# Patient Record
Sex: Female | Born: 1965 | Race: White | Hispanic: No | Marital: Married | Smoking: Never smoker
Health system: Southern US, Community
[De-identification: ages and names within clinical notes are randomized; demographics above are authoritative.]

## PROBLEM LIST (undated history)

## (undated) DIAGNOSIS — M1990 Unspecified osteoarthritis, unspecified site: Secondary | ICD-10-CM

## (undated) DIAGNOSIS — C50919 Malignant neoplasm of unspecified site of unspecified female breast: Secondary | ICD-10-CM

## (undated) DIAGNOSIS — K219 Gastro-esophageal reflux disease without esophagitis: Secondary | ICD-10-CM

## (undated) DIAGNOSIS — A770 Spotted fever due to Rickettsia rickettsii: Secondary | ICD-10-CM

## (undated) DIAGNOSIS — L409 Psoriasis, unspecified: Secondary | ICD-10-CM

## (undated) HISTORY — PX: BREAST SURGERY: SHX581

---

## 2006-05-09 ENCOUNTER — Emergency Department: Payer: Self-pay | Admitting: Emergency Medicine

## 2010-06-18 ENCOUNTER — Ambulatory Visit: Payer: Self-pay | Admitting: Specialist

## 2013-10-15 ENCOUNTER — Emergency Department: Payer: Self-pay | Admitting: Emergency Medicine

## 2013-10-15 LAB — CBC
HGB: 12.8 g/dL (ref 12.0–16.0)
MCH: 31.9 pg (ref 26.0–34.0)
MCV: 93 fL (ref 80–100)
RDW: 12.9 % (ref 11.5–14.5)

## 2013-10-15 LAB — BASIC METABOLIC PANEL
Anion Gap: 7 (ref 7–16)
BUN: 13 mg/dL (ref 7–18)
Calcium, Total: 9.4 mg/dL (ref 8.5–10.1)
Chloride: 103 mmol/L (ref 98–107)

## 2013-10-15 LAB — TROPONIN I: Troponin-I: <0.02 ng/mL

## 2013-12-20 ENCOUNTER — Ambulatory Visit: Payer: Self-pay

## 2017-01-25 ENCOUNTER — Ambulatory Visit: Payer: Managed Care, Other (non HMO) | Attending: Nurse Practitioner | Admitting: Occupational Therapy

## 2017-01-25 ENCOUNTER — Encounter: Payer: Self-pay | Admitting: Occupational Therapy

## 2017-01-25 DIAGNOSIS — M79601 Pain in right arm: Secondary | ICD-10-CM | POA: Diagnosis present

## 2017-01-25 DIAGNOSIS — M6281 Muscle weakness (generalized): Secondary | ICD-10-CM | POA: Insufficient documentation

## 2017-01-25 DIAGNOSIS — M25612 Stiffness of left shoulder, not elsewhere classified: Secondary | ICD-10-CM | POA: Insufficient documentation

## 2017-01-25 DIAGNOSIS — M25611 Stiffness of right shoulder, not elsewhere classified: Secondary | ICD-10-CM | POA: Diagnosis present

## 2017-01-25 DIAGNOSIS — I972 Postmastectomy lymphedema syndrome: Secondary | ICD-10-CM | POA: Insufficient documentation

## 2017-01-25 DIAGNOSIS — M79602 Pain in left arm: Secondary | ICD-10-CM | POA: Diagnosis present

## 2017-01-25 NOTE — Therapy (Signed)
[place] [facility] [address], [place], [address] Phone: [phone]   Fax:  [phone]  Occupational Therapy Evaluation  Patient Details  Name: Sue Wong MRN: [id number] Date of Birth: May 04, 1966 Referring Provider: Freada Bergeron  Encounter Date: 01/25/2017      OT End of Session - 01/25/17 2104    Visit Number 1   Number of Visits 16   Date for OT Re-Evaluation 03/22/17   OT Start Time 1210   OT Stop Time 1319   OT Time Calculation (min) 69 min   Activity Tolerance Patient tolerated treatment well;Patient limited by pain   Behavior During Therapy [facility] for tasks assessed/performed      No past medical history on file.  Past Surgical History:  Procedure Laterality Date  . BREAST SURGERY Bilateral 1/9 and 12/27/16    There were no vitals filed for this visit.      Subjective Assessment - 01/25/17 2050    Subjective  I had chemo last year and then surgery in 1/9 bilateral mastectomy with expanders and then again 1/23 axillary ln removed in R - 9 ln was removed - had 2 drains in and then 1 - cannot raise my arms above my head , and  I have now lymphedema in my R arm - going to start radiation soon    Patient Stated Goals I do not want a large arm  - and want to be able to raise my arms above my head - need to do radiation soon and be able to hold my arm in position    Currently in Pain? Yes   Pain Score 6    Pain Location Axilla   Pain Orientation Right;Left   Pain Descriptors / Indicators Tightness;Tender;Sore;Sharp   Pain Type Surgical pain   Pain Onset More than a month ago   Aggravating Factors  raising my arms            [facility] OT Assessment - 01/25/17 0001      Assessment   Diagnosis Bilateral mastectomy and R UE lymphedema    Referring Provider Freada Bergeron   Onset Date 12/13/16     Home  Environment   Lives With Spouse     Prior Function   Vocation Full time employment    Leisure Work as Engineer, production, R hand dominant , likes to do cycling, reading , visit with friends , movies , has home gym at home      AROM   Right Shoulder Extension 38 Degrees   Right Shoulder Flexion 80 Degrees   Right Shoulder ABduction 60 Degrees   Left Shoulder Extension 30 Degrees   Left Shoulder Flexion 97 Degrees   Left Shoulder ABduction 90 Degrees          LYMPHEDEMA/ONCOLOGY QUESTIONNAIRE - 01/25/17 1240      Right Upper Extremity Lymphedema   15 cm Proximal to Olecranon Process 33.4 cm   10 cm Proximal to Olecranon Process 33 cm   Olecranon Process 29.7 cm   15 cm Proximal to Ulnar Styloid Process 28 cm   10 cm Proximal to Ulnar Styloid Process 25 cm   Just Proximal to Ulnar Styloid Process 17.3 cm   Across Hand at [employer] 18.5 cm   At [place] of 2nd Digit 5.3 cm   At [facility] of Thumb 6 cm     Left Upper Extremity Lymphedema   15 cm Proximal to Olecranon Process  33.5 cm   10 cm Proximal to Olecranon Process 30.4 cm   Olecranon Process 27.6 cm   15 cm Proximal to Ulnar Styloid Process 26.4 cm   10 cm Proximal to Ulnar Styloid Process 23 cm   Just Proximal to Ulnar Styloid Process 17.3 cm   Across Hand at [employer] 17.6 cm   At [place] of 2nd Digit 6.4 cm   At [facility] of Thumb 6 cm     Reviewed HEP with pt and hand out provided   education done about lymphedema  HEP hand out :  Pendulum for bilateral shoulders AAROM on table for ext rotation and flexion  Supine using cane for AAROM for ABD and Flexion   External rotation with hands behind head - gentle ROM  pect stretch on wall corner or doorway And scapula squeezes  10 reps each  Slight pull - pain less than 1-2/10                  OT Education - 01/25/17 2104    Education provided Yes   Education Details Findings and HEP for ROM    Person(s) Educated Patient   Methods Explanation;Demonstration;Tactile cues;Verbal cues;Handout   Comprehension Verbal cues required;Returned  demonstration;Verbalized understanding          OT Short Term Goals - 01/25/17 2113      OT SHORT TERM GOAL #1   Title Pt R shoulder AROM improve with at least 30 degrees  for pt to do hair, pull shirt over head and reach over head in cabinet   Baseline R  flexion 80 and ABD 60, external rotation impaired    Time 4   Period Weeks   Status New     OT SHORT TERM GOAL #2   Title L shoulder AROM improve to WNL to reach over head , use in bathing and dressing without increase symptoms    Baseline L shoulder ext 30, flexion 97 ABD 90 adn external rotation impaired   Time 4   Period Weeks   Status New     OT SHORT TERM GOAL #3   Title Pt ed on self MLD to do at home to decrease lymphedema circumference in R UE by 1 cm    Baseline no knowledge   Time 3   Period Weeks   Status New           OT Long Term Goals - 01/25/17 2119      OT LONG TERM GOAL #1   Title Pt to be ind in Homeprogram for compression and MLD to decrease and maintain lymphedema in R UE by 1-2 cm    Baseline increase by 2 cm in forearm, elbow 3.1 and upper arm 2.6 - no knowledge on lymphedema management   Time 5   Period Weeks   Status New     OT LONG TERM GOAL #2   Title Bilateral UE AROM improve to WNL to do be able to get into radiation position , and return to prior level of function in daily activities    Baseline see flowsheet   Time 6   Period Weeks   Status New     OT LONG TERM GOAL #3   Title assess [name]    Time 1   Period Weeks   Status New               Plan - 01/25/17 2106    Clinical Impression Statement Pt present 6 wks  s/p bilateral mastectomies with expanders - and 4 wks out from 9 axillary ln removed in R axilla - pt had drains with both surgeries - pt present with lymphedema in R UE - upper arm increase by 2.6 cm , elbow 3.1 cm and forearm 2 cm compare to L - pt also present with decrease bilateral shoulder ROM in  all planes - R worse than L - pt has increase pain with  ROM  more than 60-90 degrees - pt in near future starting radiation    Rehab Potential Good   OT Frequency 2x / week   OT Duration 8 weeks   OT Treatment/Interventions Self-care/ADL training;Manual lymph drainage;Compression bandaging;Therapeutic exercises;Scar mobilization;Passive range of motion;Manual Therapy   Plan assess progress with HEP and start MLD - assess what compression   OT Home Exercise Plan see pt instruction   Consulted and Agree with Plan of Care Patient      Patient will benefit from skilled therapeutic intervention in order to improve the following deficits and impairments:  Decreased range of motion, Impaired flexibility, Increased edema, Decreased scar mobility, Impaired UE functional use, Pain, Decreased strength, Decreased knowledge of precautions, Decreased knowledge of use of DME, Decreased skin integrity  Visit Diagnosis: Postmastectomy lymphedema syndrome - Plan: Ot plan of care cert/re-cert  Stiffness of left shoulder, not elsewhere classified - Plan: Ot plan of care cert/re-cert  Stiffness of right shoulder, not elsewhere classified - Plan: Ot plan of care cert/re-cert  Muscle weakness (generalized) - Plan: Ot plan of care cert/re-cert  Pain in left arm - Plan: Ot plan of care cert/re-cert  Pain in right arm - Plan: Ot plan of care cert/re-cert    Problem List There are no active problems to display for this patient.   Rosalyn Gess OTR/L,CLT 01/25/2017, 9:26 PM  [place] [facility] [address], [place], [address] Phone: [phone]   Fax:  [phone]  Name: Sue Wong MRN: [id number] Date of Birth: 04-13-1966

## 2017-01-25 NOTE — Patient Instructions (Signed)
Pendulum for bilateral shoulders AAROM on table for ext rotation and flexion  Supine using cane for AAROM for ABD and Flexion   External rotation with hands behind head - gentle ROM  pect stretch on wall corner or doorway And scapula squeezes  10 reps each  Slight pull - pain less than 1-2/10

## 2017-01-26 ENCOUNTER — Ambulatory Visit: Payer: 59 | Admitting: Occupational Therapy

## 2017-01-26 ENCOUNTER — Ambulatory Visit: Payer: Managed Care, Other (non HMO) | Admitting: Occupational Therapy

## 2017-01-26 DIAGNOSIS — M25612 Stiffness of left shoulder, not elsewhere classified: Secondary | ICD-10-CM

## 2017-01-26 DIAGNOSIS — M79601 Pain in right arm: Secondary | ICD-10-CM

## 2017-01-26 DIAGNOSIS — I972 Postmastectomy lymphedema syndrome: Secondary | ICD-10-CM

## 2017-01-26 DIAGNOSIS — M6281 Muscle weakness (generalized): Secondary | ICD-10-CM

## 2017-01-26 DIAGNOSIS — M79602 Pain in left arm: Secondary | ICD-10-CM

## 2017-01-26 DIAGNOSIS — M25611 Stiffness of right shoulder, not elsewhere classified: Secondary | ICD-10-CM

## 2017-01-26 NOTE — Therapy (Signed)
[place] [facility] [address], [place], [address] Phone: [phone]   Fax:  [phone]  Occupational Therapy Treatment  Patient Details  Name: Sue Wong MRN: [id number] Date of Birth: [birth year] Referring Provider: Freada Bergeron  Encounter Date: 01/26/2017      OT End of Session - 01/26/17 1819    Visit Number 2   Number of Visits 16   Date for OT Re-Evaluation 03/22/17   OT Start Time 1413   OT Stop Time 1510   OT Time Calculation (min) 57 min   Activity Tolerance Patient tolerated treatment well;Patient limited by pain   Behavior During Therapy [facility] for tasks assessed/performed      No past medical history on file.  Past Surgical History:  Procedure Laterality Date  . BREAST SURGERY Bilateral 1/9 and 12/27/16    There were no vitals filed for this visit.      Subjective Assessment - 01/26/17 1816    Subjective  Pt report did her exercises  last night and in shower this am - had questions about drinking wine or beer    Patient Stated Goals I do not want a large arm  - and want to be able to raise my arms above my head - need to do radiation soon and be able to hold my arm in position    Currently in Pain? Yes   Pain Score 5    Pain Location Shoulder   Pain Orientation Right;Left   Pain Descriptors / Indicators Shooting;Tightness;Sore   Pain Type Surgical pain   Pain Onset More than a month ago   Aggravating Factors  Raising her arms             LYMPHEDEMA/ONCOLOGY QUESTIONNAIRE - 01/25/17 1240      Right Upper Extremity Lymphedema   15 cm Proximal to Olecranon Process 33.4 cm   10 cm Proximal to Olecranon Process 33 cm   Olecranon Process 29.7 cm   15 cm Proximal to Ulnar Styloid Process 28 cm   10 cm Proximal to Ulnar Styloid Process 25 cm   Just Proximal to Ulnar Styloid Process 17.3 cm   Across Hand at [employer] 18.5 cm   At [place] of 2nd Digit 5.3 cm   At [facility] of  Thumb 6 cm     Left Upper Extremity Lymphedema   15 cm Proximal to Olecranon Process 33.5 cm   10 cm Proximal to Olecranon Process 30.4 cm   Olecranon Process 27.6 cm   15 cm Proximal to Ulnar Styloid Process 26.4 cm   10 cm Proximal to Ulnar Styloid Process 23 cm   Just Proximal to Ulnar Styloid Process 17.3 cm   Across Hand at [employer] 17.6 cm   At [place] of 2nd Digit 6.4 cm   At [facility] of Thumb 6 cm       Pt ed on lymphedema and info provided with hand outs  Screen shoulder AROM compare to yesterday   Done MLD with pt and educated - pt to use PAA - and hand out provided : 1. Hug yourself at the base of your neck and do 8 small circles, and 2 fingers behind clavicle 8 x  2. Do 8 semicircles at left armpit and right groin 3. Pump across chest from right to left 8 times 4. Pump down the right side of trunk from armpit to groin 8 times 5. Pump up the outside of right  upper arm 8 times, inside of upper arm to outside 8x, outside of upper arm again 8x  6. Pump across chest from R to L 8 times 7. Pump  down the right side of trunk from armpit to groin 8 times 8. Pump top of forearm from wrist to elbow 8 times 9. Pump up the outside of right upper arm 8 times 10.       Pump across chest from right to left 8 times 11. Pump down the right side of trunk from armpit to groin 8 times 12. Pump up the back of the forearm from wrist to elbow 8 times 13. Pump up the outside of right upper arm 8 times 14. Pump across chest from right to left 8 times 15. Pump down the right side of trunk from armpit to groin 8 times 16. Do 8 semicircles at left armpit and right groin 8 times 17. Repeat nr.1  Pt fitted with isotoner glove and tubigrip nr D and F for R UE to wear at home as much as can - will remeasure her Wed and hope to decrease her by 1-2 cm to get fitted with over the counter compression sleeve and glove  In sidelying done some R shoulder ABD AAROM with some soft tissue massage an stretch  on lateral trunk  And in supine  Gentle slow stretch and rhitmic movement for R shoulder into ABD and ext rotation                     OT Education - 01/26/17 1819    Education provided Yes   Education Details Self MLD    [employer]) Educated Patient   Methods Explanation;Demonstration;Tactile cues;Verbal cues;Handout   Comprehension Verbalized understanding;Returned demonstration;Verbal cues required          OT Short Term Goals - 01/25/17 2113      OT SHORT TERM GOAL #1   Title Pt R shoulder AROM improve with at least 30 degrees  for pt to do hair, pull shirt over head and reach over head in cabinet   Baseline R  flexion 80 and ABD 60, external rotation impaired    Time 4   Period Weeks   Status New     OT SHORT TERM GOAL #2   Title L shoulder AROM improve to WNL to reach over head , use in bathing and dressing without increase symptoms    Baseline L shoulder ext 30, flexion 97 ABD 90 adn external rotation impaired   Time 4   Period Weeks   Status New     OT SHORT TERM GOAL #3   Title Pt ed on self MLD to do at home to decrease lymphedema circumference in R UE by 1 cm    Baseline no knowledge   Time 3   Period Weeks   Status New           OT Long Term Goals - 01/25/17 2119      OT LONG TERM GOAL #1   Title Pt to be ind in Homeprogram for compression and MLD to decrease and maintain lymphedema in R UE by 1-2 cm    Baseline increase by 2 cm in forearm, elbow 3.1 and upper arm 2.6 - no knowledge on lymphedema management   Time 5   Period Weeks   Status New     OT LONG TERM GOAL #2   Title Bilateral UE AROM improve to WNL to do be able to get  into radiation position , and return to prior level of function in daily activities    Baseline see flowsheet   Time 6   Period Weeks   Status New     OT LONG TERM GOAL #3   Title assess [name]    Time 1   Period Weeks   Status New               Plan - 01/26/17 1820    Clinical Impression  Statement Pt was ed on self MLD -and to use PAA because of expanders and going to have maybe radiation - pt sterristrips on in R axilla - did fit pt with size D and F tubi grip and isotoner glove for the weekend to provided some  compression until next time - to wear as much as she can at home - and will reassess on WEd - hope to decrease her  under 2 cm  compare to L UE - to be fitted with over the counter compression sleeve -cont with ROM  for bilateral shoulders     Rehab Potential Good   OT Frequency 2x / week   OT Duration 8 weeks   OT Treatment/Interventions Self-care/ADL training;Manual lymph drainage;Compression bandaging;Therapeutic exercises;Scar mobilization;Passive range of motion;Manual Therapy   Plan assess circumference and  self MLD - how doing - review HEP and measure ROM    OT Home Exercise Plan see pt instruction   Consulted and Agree with Plan of Care Patient      Patient will benefit from skilled therapeutic intervention in order to improve the following deficits and impairments:  Decreased range of motion, Impaired flexibility, Increased edema, Decreased scar mobility, Impaired UE functional use, Pain, Decreased strength, Decreased knowledge of precautions, Decreased knowledge of use of DME, Decreased skin integrity  Visit Diagnosis: Postmastectomy lymphedema syndrome  Stiffness of left shoulder, not elsewhere classified  Stiffness of right shoulder, not elsewhere classified  Muscle weakness (generalized)  Pain in left arm  Pain in right arm    Problem List There are no active problems to display for this patient.   Rosalyn Gess OTR/L,CLT 01/26/2017, 6:24 PM  [place] [facility] [address] [place], [address] Phone: [phone]   Fax:  [phone]  Name: Sue Wong MRN: [id number] Date of Birth: Oct 20, 1966

## 2017-01-26 NOTE — Patient Instructions (Signed)
Pt ed on MLD  1. Hug yourself at the base of your neck and do 8 small circles, and 2 fingers behind clavicle 8 x  2. Do 8 semicircles at left armpit and right groin 3. Pump across chest from right to left 8 times 4. Pump down the right side of trunk from armpit to groin 8 times 5. Pump up the outside of right upper arm 8 times, inside of upper arm to outside 8x, outside of upper arm again 8x  6. Pump across chest from R to L 8 times 7. Pump  down the right side of trunk from armpit to groin 8 times 8. Pump top of forearm from wrist to elbow 8 times 9. Pump up the outside of right upper arm 8 times 10.       Pump across chest from right to left 8 times 11. Pump down the right side of trunk from armpit to groin 8 times 12. Pump up the back of the forearm from wrist to elbow 8 times 13. Pump up the outside of right upper arm 8 times 14. Pump across chest from right to left 8 times 15. Pump down the right side of trunk from armpit to groin 8 times 16. Do 8 semicircles at left armpit and right groin 8 times 17. Repeat nr.     And cont with same HEP provided yesterday

## 2017-02-01 ENCOUNTER — Ambulatory Visit: Payer: Managed Care, Other (non HMO) | Admitting: Occupational Therapy

## 2017-02-01 DIAGNOSIS — M25612 Stiffness of left shoulder, not elsewhere classified: Secondary | ICD-10-CM

## 2017-02-01 DIAGNOSIS — M6281 Muscle weakness (generalized): Secondary | ICD-10-CM

## 2017-02-01 DIAGNOSIS — I972 Postmastectomy lymphedema syndrome: Secondary | ICD-10-CM | POA: Diagnosis not present

## 2017-02-01 DIAGNOSIS — M79601 Pain in right arm: Secondary | ICD-10-CM

## 2017-02-01 DIAGNOSIS — M25611 Stiffness of right shoulder, not elsewhere classified: Secondary | ICD-10-CM

## 2017-02-01 DIAGNOSIS — M79602 Pain in left arm: Secondary | ICD-10-CM

## 2017-02-01 NOTE — Therapy (Signed)
[place] [facility] [address] [place], [address] Phone: [phone]   Fax:  [phone]  Occupational Therapy Treatment  Patient Details  Name: Sue Wong MRN: [id number] Date of Birth: [birth year] Referring Provider: Freada Wong  Encounter Date: 02/01/2017      OT End of Session - 02/01/17 0947    Visit Number 3   Number of Visits 16   Date for OT Re-Evaluation 03/22/17   OT Start Time 0804   OT Stop Time 0910   OT Time Calculation (min) 66 min   Activity Tolerance Patient tolerated treatment well;Patient limited by pain   Behavior During Therapy [facility] for tasks assessed/performed      No past medical history on file.  Past Surgical History:  Procedure Laterality Date  . BREAST SURGERY Bilateral 1/9 and 12/27/16    There were no vitals filed for this visit.      Subjective Assessment - 02/01/17 0806    Subjective  Feel there is more mobility - no pain - had 48hr bug - feel heavy my arm - feels like I want to wear something at work   Patient Stated Goals I do not want a large arm  - and want to be able to raise my arms above my head - need to do radiation soon and be able to hold my arm in position    Currently in Pain? No/denies            [facility] OT Assessment - 02/01/17 0001      AROM   Right Shoulder Extension 58 Degrees   Right Shoulder Flexion 105 Degrees   Right Shoulder ABduction 100 Degrees   Left Shoulder Extension 54 Degrees   Left Shoulder Flexion 122 Degrees   Left Shoulder ABduction 105 Degrees         LYMPHEDEMA/ONCOLOGY QUESTIONNAIRE - 02/01/17 0807      Right Upper Extremity Lymphedema   15 cm Proximal to Olecranon Process 34 cm   10 cm Proximal to Olecranon Process 32 cm   Olecranon Process 29.4 cm   15 cm Proximal to Ulnar Styloid Process 28.4 cm   10 cm Proximal to Ulnar Styloid Process 25 cm   Just Proximal to Ulnar Styloid Process 17.1 cm   Across Hand at  [employer] 18.5 cm       Measured circumference of R UE  - most same - 2 increase - and elbow and distal upper arm increase Measured AROM for bilateral shoulder - see flowsheet  Pt supine Soft tissue mobs for Pect stretch  R axilla- sterristrips come off - all healed - scar mobs started and pt to have family assist  Shoulder in 90 degrees ABD and external rotation on pillow - stretch in axilla - with soft tissue  AAROM by OT for bilateral shoulder flexion and ABD in supine  Attempted snow angels - on pillow  - cannot keep elbow on pillow  External rotation on pillow - L and R  8-10 reps   Kinesiotape done for R Shoulder and upper arm - with achor in R cervical ln  3 fingers into upper arm  Tape achor on R upper traps - with 3 fingers over R scapula to axilla  Pt ed on precautions and allergy signs   Demo and ed pt on bandaging R UE from hand and upper arm - using 6 cm wrist<>hand<>forearm  And then 8 cm wrist<>hand<> to  upper  arm  Pt to wear after work 2 hrs , then try 4 hrs - and then can try at night time but need Isotoner glove on hand                      OT Education - 02/01/17 0946    Education provided Yes   Education Details taping , MLD, ROM , scar massage., bandaging    Person(s) Educated Patient   Methods Explanation;Demonstration;Tactile cues;Verbal cues   Comprehension Verbal cues required;Returned demonstration;Verbalized understanding          OT Short Term Goals - 01/25/17 2113      OT SHORT TERM GOAL #1   Title Pt R shoulder AROM improve with at least 30 degrees  for pt to do hair, pull shirt over head and reach over head in cabinet   Baseline R  flexion 80 and ABD 60, external rotation impaired    Time 4   Period Weeks   Status New     OT SHORT TERM GOAL #2   Title L shoulder AROM improve to WNL to reach over head , use in bathing and dressing without increase symptoms    Baseline L shoulder ext 30, flexion 97 ABD 90 adn external  rotation impaired   Time 4   Period Weeks   Status New     OT SHORT TERM GOAL #3   Title Pt ed on self MLD to do at home to decrease lymphedema circumference in R UE by 1 cm    Baseline no knowledge   Time 3   Period Weeks   Status New           OT Long Term Goals - 01/25/17 2119      OT LONG TERM GOAL #1   Title Pt to be ind in Homeprogram for compression and MLD to decrease and maintain lymphedema in R UE by 1-2 cm    Baseline increase by 2 cm in forearm, elbow 3.1 and upper arm 2.6 - no knowledge on lymphedema management   Time 5   Period Weeks   Status New     OT LONG TERM GOAL #2   Title Bilateral UE AROM improve to WNL to do be able to get into radiation position , and return to prior level of function in daily activities    Baseline see flowsheet   Time 6   Period Weeks   Status New     OT LONG TERM GOAL #3   Title assess Sue Wong    Time 1   Period Weeks   Status New               Plan - 02/01/17 0947    Clinical Impression Statement Pt R UE circumference still increase - about the same - but pts  ROM in R and L shoulder increase greatly - pt was taped for R UE and thoracic lymphedema and ed on bandaging after work and maybe during night if can tolerate    Rehab Potential Good   OT Frequency 2x / week   OT Duration 8 weeks   OT Treatment/Interventions Self-care/ADL training;Manual lymph drainage;Compression bandaging;Therapeutic exercises;Scar mobilization;Passive range of motion;Manual Therapy   Plan assess progress in lymphedema, ROM , pain , scar    OT Home Exercise Plan see pt instruction   Consulted and Agree with Plan of Care Patient      Patient will benefit from skilled therapeutic intervention in order to  improve the following deficits and impairments:  Decreased range of motion, Impaired flexibility, Increased edema, Decreased scar mobility, Impaired UE functional use, Pain, Decreased strength, Decreased knowledge of precautions,  Decreased knowledge of use of DME, Decreased skin integrity  Visit Diagnosis: Postmastectomy lymphedema syndrome  Stiffness of left shoulder, not elsewhere classified  Stiffness of right shoulder, not elsewhere classified  Muscle weakness (generalized)  Pain in left arm  Pain in right arm    Problem List There are no active problems to display for this patient.   Rosalyn Gess OTR/L,CLT 02/01/2017, 9:50 AM  [place] [facility] [address], [place], [address] Phone: [phone]   Fax:  [phone]  Name: Sue Wong MRN: [id number] Date of Birth: 04/24/1966

## 2017-02-01 NOTE — Patient Instructions (Signed)
Same HEP for ROM  Add scar massage in R axilla Kinesiotape done for R Shoulder and upper arm - with achor in R cervical ln  3 fingers into upper arm  Tape achor on R upper traps - with 3 fingers over R scapula to axilla  Pt ed on precautions and allergy signs   Demo and ed pt on bandaging R UE from hand and upper arm - using 6 cm wrist<>hand<>forearm  And then 8 cm wrist<>hand<> to  upper arm  Pt to wear after work 2 hrs , then try 4 hrs - and then can try at night time but need Isotoner glove on hand

## 2017-02-03 ENCOUNTER — Ambulatory Visit: Payer: 59 | Attending: Nurse Practitioner | Admitting: Occupational Therapy

## 2017-02-03 DIAGNOSIS — I972 Postmastectomy lymphedema syndrome: Secondary | ICD-10-CM | POA: Diagnosis not present

## 2017-02-03 DIAGNOSIS — M79602 Pain in left arm: Secondary | ICD-10-CM | POA: Insufficient documentation

## 2017-02-03 DIAGNOSIS — M25611 Stiffness of right shoulder, not elsewhere classified: Secondary | ICD-10-CM | POA: Diagnosis present

## 2017-02-03 DIAGNOSIS — M79601 Pain in right arm: Secondary | ICD-10-CM | POA: Diagnosis present

## 2017-02-03 DIAGNOSIS — M6281 Muscle weakness (generalized): Secondary | ICD-10-CM | POA: Diagnosis present

## 2017-02-03 DIAGNOSIS — M25612 Stiffness of left shoulder, not elsewhere classified: Secondary | ICD-10-CM | POA: Insufficient documentation

## 2017-02-03 NOTE — Therapy (Signed)
[facility] [facility] [address] [place], [address] Phone: [phone]   Fax:  [phone]  Occupational Therapy Treatment  Patient Details  Name: Sue Wong MRN: [id number] Date of Birth: 08-27-1966 Referring Provider: Freada Bergeron  Encounter Date: 02/03/2017      OT End of Session - 02/03/17 1613    Visit Number 4   Number of Visits 16   Date for OT Re-Evaluation 03/22/17   OT Start Time 0902   OT Stop Time 1000   OT Time Calculation (min) 58 min   Activity Tolerance Patient tolerated treatment well;Patient limited by pain   Behavior During Therapy [facility] for tasks assessed/performed      No past medical history on file.  Past Surgical History:  Procedure Laterality Date  . BREAST SURGERY Bilateral 1/9 and 12/27/16    There were no vitals filed for this visit.      Subjective Assessment - 02/03/17 1032    Subjective  Did okay - I did not use the bandages - but the tape stayed on - took off front one this am - swelling under arm feels better where tape was    Patient Stated Goals I do not want a large arm  - and want to be able to raise my arms above my head - need to do radiation soon and be able to hold my arm in position    Currently in Pain? No/denies             LYMPHEDEMA/ONCOLOGY QUESTIONNAIRE - 02/03/17 0907      Right Upper Extremity Lymphedema   10 cm Proximal to Olecranon Process 32 cm   Olecranon Process 29 cm   15 cm Proximal to Ulnar Styloid Process 28 cm   10 cm Proximal to Ulnar Styloid Process 24.6 cm   Just Proximal to Ulnar Styloid Process 17.1 cm   Across Hand at [employer] 18.2 cm       Measured circumference of R UE  decrease  See flowsheet   Soft tissue mobs for Pect stretch  R axilla- scar mobs and massage  Shoulder in 90 degrees ABD and external rotation on pillow - stretch in axilla - with soft tissue  AAROM by OT for bilateral shoulder flexion and ABD  in supine  With contract and relax end range AAROM slides on wall for flexion L and R 10 reps Pect stretch on wall in corner 10 reps  External rotation on pillow - L and R and end range contract and relax into pillow 8-10 reps   Kinesiotape done for R Shoulder and upper arm - with achor in R cervical ln  3 fingers into upper arm  Tape achor on R upper traps - with 3 fingers over R scapula to axilla  Pt ed on precautions and allergy signs    pt  bandaging R UE from hand and upper arm - using 6 cm wrist<>hand<>forearm  And then 8 cm wrist<>hand<> to  upper arm  Pt to wear after work 2 hrs , then try 4 hrs - and  Isotoner glove on hand  - if doing high risk act - want to try Elliptic machine                     OT Education - 02/03/17 1613    Education provided Yes   Education Details taping , HEP    Person(s) Educated Patient  Methods Explanation;Demonstration;Tactile cues;Verbal cues   Comprehension Verbal cues required;Returned demonstration;Verbalized understanding          OT Short Term Goals - 01/25/17 2113      OT SHORT TERM GOAL #1   Title Pt R shoulder AROM improve with at least 30 degrees  for pt to do hair, pull shirt over head and reach over head in cabinet   Baseline R  flexion 80 and ABD 60, external rotation impaired    Time 4   Period Weeks   Status New     OT SHORT TERM GOAL #2   Title L shoulder AROM improve to WNL to reach over head , use in bathing and dressing without increase symptoms    Baseline L shoulder ext 30, flexion 97 ABD 90 adn external rotation impaired   Time 4   Period Weeks   Status New     OT SHORT TERM GOAL #3   Title Pt ed on self MLD to do at home to decrease lymphedema circumference in R UE by 1 cm    Baseline no knowledge   Time 3   Period Weeks   Status New           OT Long Term Goals - 01/25/17 2119      OT LONG TERM GOAL #1   Title Pt to be ind in Homeprogram for compression and MLD to decrease and  maintain lymphedema in R UE by 1-2 cm    Baseline increase by 2 cm in forearm, elbow 3.1 and upper arm 2.6 - no knowledge on lymphedema management   Time 5   Period Weeks   Status New     OT LONG TERM GOAL #2   Title Bilateral UE AROM improve to WNL to do be able to get into radiation position , and return to prior level of function in daily activities    Baseline see flowsheet   Time 6   Period Weeks   Status New     OT LONG TERM GOAL #3   Title assess Sue Wong    Time 1   Period Weeks   Status New               Plan - 02/03/17 1614    Clinical Impression Statement Pt making progress in ROM in bilateral shoulders , as well as circumference decrease in R UE with kinesiotape - scar in R axilla still adhere - focus on scar mobs , soft tissue and ROM - pt to bandage arm with high risk act    Rehab Potential Good   OT Frequency 2x / week   OT Duration 8 weeks   OT Treatment/Interventions Self-care/ADL training;Manual lymph drainage;Compression bandaging;Therapeutic exercises;Scar mobilization;Passive range of motion;Manual Therapy   Plan assess circumference , ROM    OT Home Exercise Plan see pt instruction   Consulted and Agree with Plan of Care Patient      Patient will benefit from skilled therapeutic intervention in order to improve the following deficits and impairments:  Decreased range of motion, Impaired flexibility, Increased edema, Decreased scar mobility, Impaired UE functional use, Pain, Decreased strength, Decreased knowledge of precautions, Decreased knowledge of use of DME, Decreased skin integrity  Visit Diagnosis: Postmastectomy lymphedema syndrome  Stiffness of left shoulder, not elsewhere classified  Stiffness of right shoulder, not elsewhere classified  Muscle weakness (generalized)  Pain in left arm  Pain in right arm    Problem List There are no active problems to  display for this patient.   Rosalyn Gess OTR/L,CLT 02/03/2017, 4:16  PM  [place] [facility] [address], [place], [address] Phone: [phone]   Fax:  [phone]  Name: Sue Wong MRN: [id number] Date of Birth: 06-15-1966

## 2017-02-03 NOTE — Patient Instructions (Addendum)
Same HEP as last time  But contract and relax into ext rotation on pillow

## 2017-02-06 ENCOUNTER — Ambulatory Visit: Payer: 59 | Admitting: Occupational Therapy

## 2017-02-06 DIAGNOSIS — I972 Postmastectomy lymphedema syndrome: Secondary | ICD-10-CM | POA: Diagnosis not present

## 2017-02-06 DIAGNOSIS — M25611 Stiffness of right shoulder, not elsewhere classified: Secondary | ICD-10-CM

## 2017-02-06 DIAGNOSIS — M6281 Muscle weakness (generalized): Secondary | ICD-10-CM

## 2017-02-06 DIAGNOSIS — M79602 Pain in left arm: Secondary | ICD-10-CM

## 2017-02-06 DIAGNOSIS — M25612 Stiffness of left shoulder, not elsewhere classified: Secondary | ICD-10-CM

## 2017-02-06 DIAGNOSIS — M79601 Pain in right arm: Secondary | ICD-10-CM

## 2017-02-06 NOTE — Therapy (Signed)
[place] [facility] [address] [place], [address] Phone: [phone]   Fax:  [phone]  Occupational Therapy Treatment  Patient Details  Name: TANAZIA FAZEKAS MRN: [id number] Date of Birth: 05/09/1966 Referring Provider: Freada Bergeron  Encounter Date: 02/06/2017      OT End of Session - 02/06/17 1543    Visit Number 5   Number of Visits 16   Date for OT Re-Evaluation 03/22/17   OT Start Time 1304   OT Stop Time 1355   OT Time Calculation (min) 51 min   Activity Tolerance Patient tolerated treatment well   Behavior During Therapy [facility] for tasks assessed/performed      No past medical history on file.  Past Surgical History:  Procedure Laterality Date  . BREAST SURGERY Bilateral 1/9 and 12/27/16    There were no vitals filed for this visit.      Subjective Assessment - 02/06/17 1537    Subjective  It felt like yesterday and today my upper arm and forearm - fluid was filling in to it - I did bandage some over the weekend and donn glove with sleeve today at work - did replace the tape on my back this am    Patient Stated Goals I do not want a large arm  - and want to be able to raise my arms above my head - need to do radiation soon and be able to hold my arm in position    Currently in Pain? No/denies            [facility] OT Assessment - 02/06/17 0001      AROM   Right Shoulder Extension 65 Degrees   Right Shoulder Flexion 115 Degrees   Right Shoulder ABduction 110 Degrees   Left Shoulder Extension 60 Degrees   Left Shoulder Flexion 130135 Degrees   Left Shoulder ABduction 135 Degrees         LYMPHEDEMA/ONCOLOGY QUESTIONNAIRE - 02/06/17 1307      Right Upper Extremity Lymphedema   15 cm Proximal to Olecranon Process 34 cm   10 cm Proximal to Olecranon Process 32 cm   Olecranon Process 28.5 cm   15 cm Proximal to Ulnar Styloid Process 27.3 cm   10 cm Proximal to Ulnar Styloid Process 24.6 cm    Just Proximal to Ulnar Styloid Process 16.6 cm   Across Hand at [employer] 17.5 cm        Measured circumference of R UE - See flowsheet  - ROM measured see flowsheet  great progress    Soft tissue mobs for Pect stretch  R axilla- scar mobs and massage prior to soft tissue mobs   R Shoulder in 120 degrees ABD and external rotation on pillow - stretch in axilla - with soft tissue  AAROM by OT for bilateral shoulder flexion and ABD in supine  With contract and relax end range AAROM slides on wall for flexion and ABD L and R 10 reps Pect stretch on wall in corner 10 reps  Scapula squeezes on doorframe 10 reps  External rotation over head  on pillow - L and R and end range contract and relax into pillow 8-10 reps   Kinesiotape done for R Shoulder and upper arm - with achor in R cervical ln  3 fingers into upper inner  arm  Tape achor on R upper traps - with 3 fingers over R scapula to axilla  Pt ed on  precautions and allergy signs    pt  bandaging R UE from hand and upper arm - using 6 cm wrist<>hand<>forearm And then 8 cm wrist<>hand<>to upper arm  Pt to wear after work 2 hrs , then try 4 hrs - and  Isotoner glove on hand  - if doing high risk act - want to try Elliptic machine                    OT Education - 02/06/17 1542    Education provided Yes   Education Details Taping , badaging R UE and HEP for ROM    Person(s) Educated Patient   Methods Explanation;Tactile cues;Verbal cues;Demonstration   Comprehension Verbal cues required;Returned demonstration;Verbalized understanding          OT Short Term Goals - 01/25/17 2113      OT SHORT TERM GOAL #1   Title Pt R shoulder AROM improve with at least 30 degrees  for pt to do hair, pull shirt over head and reach over head in cabinet   Baseline R  flexion 80 and ABD 60, external rotation impaired    Time 4   Period Weeks   Status New     OT SHORT TERM GOAL #2   Title L shoulder AROM improve to  WNL to reach over head , use in bathing and dressing without increase symptoms    Baseline L shoulder ext 30, flexion 97 ABD 90 adn external rotation impaired   Time 4   Period Weeks   Status New     OT SHORT TERM GOAL #3   Title Pt ed on self MLD to do at home to decrease lymphedema circumference in R UE by 1 cm    Baseline no knowledge   Time 3   Period Weeks   Status New           OT Long Term Goals - 01/25/17 2119      OT LONG TERM GOAL #1   Title Pt to be ind in Homeprogram for compression and MLD to decrease and maintain lymphedema in R UE by 1-2 cm    Baseline increase by 2 cm in forearm, elbow 3.1 and upper arm 2.6 - no knowledge on lymphedema management   Time 5   Period Weeks   Status New     OT LONG TERM GOAL #2   Title Bilateral UE AROM improve to WNL to do be able to get into radiation position , and return to prior level of function in daily activities    Baseline see flowsheet   Time 6   Period Weeks   Status New     OT LONG TERM GOAL #3   Title assess [name]    Time 1   Period Weeks   Status New               Plan - 02/06/17 1544    Clinical Impression Statement Pt show great progress in ROM in bilateral shoulders and circumference of R UE decreasing gradually - plan to fit with over the counter compression sleeve if in 1 cm compare to L UE - cont scar and soft tissue mobs  with increasing ROM    Rehab Potential Good   OT Frequency 2x / week   OT Duration 6 weeks   OT Treatment/Interventions Self-care/ADL training;Manual lymph drainage;Compression bandaging;Therapeutic exercises;Scar mobilization;Passive range of motion;Manual Therapy   Plan assess circumference and ROM    OT Home Exercise Plan  see pt instruction   Consulted and Agree with Plan of Care Patient      Patient will benefit from skilled therapeutic intervention in order to improve the following deficits and impairments:  Decreased range of motion, Impaired flexibility,  Increased edema, Decreased scar mobility, Impaired UE functional use, Pain, Decreased strength, Decreased knowledge of precautions, Decreased knowledge of use of DME, Decreased skin integrity  Visit Diagnosis: Postmastectomy lymphedema syndrome  Stiffness of left shoulder, not elsewhere classified  Stiffness of right shoulder, not elsewhere classified  Muscle weakness (generalized)  Pain in left arm  Pain in right arm    Problem List There are no active problems to display for this patient.   Rosalyn Gess OTR/L,CLT 02/06/2017, 3:46 PM  [place] [facility] [address], [place], [address] Phone: [phone]   Fax:  [phone]  Name: TYRIA MARLETTE MRN: [id number] Date of Birth: November 23, 1966

## 2017-02-06 NOTE — Patient Instructions (Addendum)
Same HEP - add AAROM on wall for bil ABD  Focus on pect stretches  And other

## 2017-02-09 ENCOUNTER — Ambulatory Visit: Payer: 59 | Admitting: Occupational Therapy

## 2017-02-09 DIAGNOSIS — M6281 Muscle weakness (generalized): Secondary | ICD-10-CM

## 2017-02-09 DIAGNOSIS — M79601 Pain in right arm: Secondary | ICD-10-CM

## 2017-02-09 DIAGNOSIS — M79602 Pain in left arm: Secondary | ICD-10-CM

## 2017-02-09 DIAGNOSIS — M25612 Stiffness of left shoulder, not elsewhere classified: Secondary | ICD-10-CM

## 2017-02-09 DIAGNOSIS — M25611 Stiffness of right shoulder, not elsewhere classified: Secondary | ICD-10-CM

## 2017-02-09 DIAGNOSIS — I972 Postmastectomy lymphedema syndrome: Secondary | ICD-10-CM

## 2017-02-09 NOTE — Therapy (Signed)
[place] [facility] [address], [place], [address] Phone: [phone]   Fax:  [phone]  Occupational Therapy Treatment  Patient Details  Name: Sue Wong MRN: [id number] Date of Birth: 1966-01-28 Referring Provider: Freada Bergeron  Encounter Date: 02/09/2017      OT End of Session - 02/09/17 2000    Visit Number 6   Number of Visits 16   Date for OT Re-Evaluation 03/22/17   OT Start Time 1333   OT Stop Time 1415   OT Time Calculation (min) 42 min   Activity Tolerance Patient tolerated treatment well   Behavior During Therapy [facility] for tasks assessed/performed      No past medical history on file.  Past Surgical History:  Procedure Laterality Date  . BREAST SURGERY Bilateral 1/9 and 12/27/16    There were no vitals filed for this visit.      Subjective Assessment - 02/09/17 1341    Subjective  Felt little more heavy today from using it - fatigue little - did do elipticle but bandaged    Patient Stated Goals I do not want a large arm  - and want to be able to raise my arms above my head - need to do radiation soon and be able to hold my arm in position    Currently in Pain? No/denies             LYMPHEDEMA/ONCOLOGY QUESTIONNAIRE - 02/09/17 1344      Right Upper Extremity Lymphedema   15 cm Proximal to Olecranon Process 33.6 cm   10 cm Proximal to Olecranon Process 31.5 cm   Olecranon Process 29 cm   15 cm Proximal to Ulnar Styloid Process 27.6 cm   10 cm Proximal to Ulnar Styloid Process 24.5 cm   Just Proximal to Ulnar Styloid Process 17 cm   Across Hand at [employer] 18 cm      Measured circumference of R UE -See flowsheet  - ROM measured see flowsheet upper arm decrease  pt now close to 1 cm compare to other arm - pt to get fitted for compression sleeve and gauntlet - refer to [employer]  Soft tissue mobs for bil Pect stretch  R axilla- scar mobs and massage prior to soft  tissue mobs   R Shoulder  ABD and external rotation on pillow - stretch in axilla - with soft tissue  AAROM by OT for bilateral shoulder flexion and ABD in supine With contract and relax end range AAROM slides on wall for flexion and ABD L and R 10 reps at the same time Pect stretch on wall in corner 10 reps  Scapula squeezes on doorframe 10 reps  External rotation over head  on pillow - L and R and end range contract and relax into pillow 8-10 reps   pt to wear compression sleeve and gauntlet when using exercises machine  and 1 lbs for shoulder flexion 90 degrees, triceps, scapula retraction,  Ext rotation 10 reps                       OT Education - 02/09/17 2000    Education provided Yes   Education Details HEP for 1 lbs weight and compression garment   Person(s) Educated Patient   Methods Explanation;Demonstration;Verbal cues;Tactile cues   Comprehension Verbalized understanding;Verbal cues required          OT Short Term Goals - 01/25/17 2113  OT SHORT TERM GOAL #1   Title Pt R shoulder AROM improve with at least 30 degrees  for pt to do hair, pull shirt over head and reach over head in cabinet   Baseline R  flexion 80 and ABD 60, external rotation impaired    Time 4   Period Weeks   Status New     OT SHORT TERM GOAL #2   Title L shoulder AROM improve to WNL to reach over head , use in bathing and dressing without increase symptoms    Baseline L shoulder ext 30, flexion 97 ABD 90 adn external rotation impaired   Time 4   Period Weeks   Status New     OT SHORT TERM GOAL #3   Title Pt ed on self MLD to do at home to decrease lymphedema circumference in R UE by 1 cm    Baseline no knowledge   Time 3   Period Weeks   Status New           OT Long Term Goals - 01/25/17 2119      OT LONG TERM GOAL #1   Title Pt to be ind in Homeprogram for compression and MLD to decrease and maintain lymphedema in R UE by 1-2 cm    Baseline increase  by 2 cm in forearm, elbow 3.1 and upper arm 2.6 - no knowledge on lymphedema management   Time 5   Period Weeks   Status New     OT LONG TERM GOAL #2   Title Bilateral UE AROM improve to WNL to do be able to get into radiation position , and return to prior level of function in daily activities    Baseline see flowsheet   Time 6   Period Weeks   Status New     OT LONG TERM GOAL #3   Title assess [name]    Time 1   Period Weeks   Status New               Plan - 02/09/17 2001    Rehab Potential Good   OT Frequency 2x / week   OT Duration 6 weeks   OT Treatment/Interventions Self-care/ADL training;Manual lymph drainage;Compression bandaging;Therapeutic exercises;Scar mobilization;Passive range of motion;Manual Therapy   Plan assess compression garment and ROM    OT Home Exercise Plan see pt instruction   Consulted and Agree with Plan of Care Patient      Patient will benefit from skilled therapeutic intervention in order to improve the following deficits and impairments:  Decreased range of motion, Impaired flexibility, Increased edema, Decreased scar mobility, Impaired UE functional use, Pain, Decreased strength, Decreased knowledge of precautions, Decreased knowledge of use of DME, Decreased skin integrity  Visit Diagnosis: Postmastectomy lymphedema syndrome  Stiffness of left shoulder, not elsewhere classified  Stiffness of right shoulder, not elsewhere classified  Muscle weakness (generalized)  Pain in left arm  Pain in right arm    Problem List There are no active problems to display for this patient.   Rosalyn Gess OTR/L,CLT 02/09/2017, 8:03 PM  [place] [facility] [address] [place], [address] Phone: [phone]   Fax:  [phone]  Name: Sue Wong MRN: [id number] Date of Birth: 1966-06-30

## 2017-02-09 NOTE — Patient Instructions (Addendum)
  pt to wear compression sleeve and gauntlet when using exercises machine  and 1 lbs for shoulder flexion 90 degrees, triceps, scapula retraction,  Ext rotation 10 reps

## 2017-02-14 ENCOUNTER — Ambulatory Visit: Payer: 59 | Admitting: Occupational Therapy

## 2017-02-14 DIAGNOSIS — I972 Postmastectomy lymphedema syndrome: Secondary | ICD-10-CM | POA: Diagnosis not present

## 2017-02-14 DIAGNOSIS — M6281 Muscle weakness (generalized): Secondary | ICD-10-CM

## 2017-02-14 DIAGNOSIS — M79602 Pain in left arm: Secondary | ICD-10-CM

## 2017-02-14 DIAGNOSIS — M25611 Stiffness of right shoulder, not elsewhere classified: Secondary | ICD-10-CM

## 2017-02-14 DIAGNOSIS — M79601 Pain in right arm: Secondary | ICD-10-CM

## 2017-02-14 DIAGNOSIS — M25612 Stiffness of left shoulder, not elsewhere classified: Secondary | ICD-10-CM

## 2017-02-14 NOTE — Therapy (Signed)
[facility] [address] [place], [address] Phone: [phone]   Fax:  [phone]  Occupational Therapy Treatment  Patient Details  Name: JOELEE Wong MRN: [id number] Date of Birth: [birth year] Referring Provider: Freada Bergeron  Encounter Date: 02/14/2017      OT End of Session - 02/14/17 1539    Visit Number 7   Number of Visits 16   Date for OT Re-Evaluation 03/22/17   OT Start Time 1240   OT Stop Time 1323   OT Time Calculation (min) 43 min   Activity Tolerance Patient tolerated treatment well   Behavior During Therapy [facility] for tasks assessed/performed      No past medical history on file.  Past Surgical History:  Procedure Laterality Date  . BREAST SURGERY Bilateral 1/9 and 12/27/16    There were no vitals filed for this visit.      Subjective Assessment - 02/14/17 1245    Subjective  Was able to get scan done Friday and kept position - they took some of the fluid out of L expander - and then my sleeve  I could only wear for about 2 hrs then it was hurting my arm and my thumb got numb - did not wear tape this weekend or wrap - did use my  hand  a lot this morning at work     Patient Stated Goals I do not want a large arm  - and want to be able to raise my arms above my head - need to do radiation soon and be able to hold my arm in position    Currently in Pain? No/denies            [facility] OT Assessment - 02/14/17 0001      AROM   Right Shoulder Extension 70 Degrees   Right Shoulder Flexion 130 Degrees   Right Shoulder ABduction 120 Degrees   Left Shoulder Extension 65 Degrees   Left Shoulder Flexion 140 Degrees   Left Shoulder ABduction 140 Degrees         LYMPHEDEMA/ONCOLOGY QUESTIONNAIRE - 02/14/17 1246      Right Upper Extremity Lymphedema   15 cm Proximal to Olecranon Process 33 cm   10 cm Proximal to Olecranon Process 32 cm   Olecranon Process 28.6 cm   15 cm Proximal  to Ulnar Styloid Process 27.8 cm   10 cm Proximal to Ulnar Styloid Process 25 cm   Just Proximal to Ulnar Styloid Process 17.3 cm   Across Hand at [employer] 18 cm   At [place] of 2nd Digit 6 cm   At [facility] of Thumb 6.4 cm       Assess her compression sleeve - Mediven 20-59mmHg and gauntlet ( Juzo)  - pt report she had some pain wearing it over weekend Feels first time donn tight  Pt was fitted with Diva sleeve ( med) sample - to alternate and wearing it like the Mediven 2 hrs at a time  With gauntlet  And will remeasure her in 2 days   Measured circumference of R UE -See flowsheet - ROM measured see flowsheet Had some decrease and some increase    R axilla- scar mobs and massage prior to soft tissue mobs   R Shoulder  ABD and external rotation on pillow - stretch in axilla - with soft tissue  AAROM by OT for bilateral shoulder flexion and ABD in supine With contract and relax  end range External rotation over head on pillow - L and R and end range contract and relax into pillow 8-10 reps   pt to wear compression sleeve and gauntlet 2 hrs at time - at work - and in combination with taping  Kinesiotaping done this date with achor at R groin and 3 "fingers up to IA towards R axilla - but around radiation tattoos                    OT Education - 02/14/17 1539    Education provided Yes   Education Details compression sleeve wearing    Person(s) Educated Patient   Methods Explanation;Demonstration;Tactile cues;Verbal cues   Comprehension Verbalized understanding;Verbal cues required;Returned demonstration          OT Short Term Goals - 01/25/17 2113      OT SHORT TERM GOAL #1   Title Pt R shoulder AROM improve with at least 30 degrees  for pt to do hair, pull shirt over head and reach over head in cabinet   Baseline R  flexion 80 and ABD 60, external rotation impaired    Time 4   Period Weeks   Status New     OT SHORT TERM GOAL #2   Title L  shoulder AROM improve to WNL to reach over head , use in bathing and dressing without increase symptoms    Baseline L shoulder ext 30, flexion 97 ABD 90 adn external rotation impaired   Time 4   Period Weeks   Status New     OT SHORT TERM GOAL #3   Title Pt ed on self MLD to do at home to decrease lymphedema circumference in R UE by 1 cm    Baseline no knowledge   Time 3   Period Weeks   Status New           OT Long Term Goals - 01/25/17 2119      OT LONG TERM GOAL #1   Title Pt to be ind in Homeprogram for compression and MLD to decrease and maintain lymphedema in R UE by 1-2 cm    Baseline increase by 2 cm in forearm, elbow 3.1 and upper arm 2.6 - no knowledge on lymphedema management   Time 5   Period Weeks   Status New     OT LONG TERM GOAL #2   Title Bilateral UE AROM improve to WNL to do be able to get into radiation position , and return to prior level of function in daily activities    Baseline see flowsheet   Time 6   Period Weeks   Status New     OT LONG TERM GOAL #3   Title assess Sue Wong    Time 1   Period Weeks   Status New               Plan - 02/14/17 1540    Clinical Impression Statement Pt did not do taping over the weekend and self bandage - also report pain or discomfort to R UE with wearing compression sleeve-  fit was assess and pt was fitted also with med Diva sleeve to wear and gauntle  the next 2 days 2 hrs at time - and taping for trunkle lymph edmea - 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 with achor in R  ing ln -  great progress in ROM  of shoulders    Rehab Potential  Good   OT Frequency 2x / week   OT Duration 4 weeks   OT Treatment/Interventions Self-care/ADL training;Manual lymph drainage;Compression bandaging;Therapeutic exercises;Scar mobilization;Passive range of motion;Manual Therapy   Plan assess how did with compression garments , taping and ROM    OT Home Exercise Plan see pt instruction   Consulted  and Agree with Plan of Care Patient      Patient will benefit from skilled therapeutic intervention in order to improve the following deficits and impairments:  Decreased range of motion, Impaired flexibility, Increased edema, Decreased scar mobility, Impaired UE functional use, Pain, Decreased strength, Decreased knowledge of precautions, Decreased knowledge of use of DME, Decreased skin integrity  Visit Diagnosis: Postmastectomy lymphedema syndrome  Stiffness of left shoulder, not elsewhere classified  Stiffness of right shoulder, not elsewhere classified  Muscle weakness (generalized)  Pain in left arm  Pain in right arm    Problem List There are no active problems to display for this patient.   Rosalyn Gess OTR/L,CLT 02/14/2017, 3:43 PM  [place] [facility] [address] [place], [address] Phone: [phone]   Fax:  [phone]  Name: Sue Wong MRN: [id number] Date of Birth: [birth year]

## 2017-02-14 NOTE — Patient Instructions (Addendum)
Same stretches and ROM  pt to wear compression sleeve and gauntlet 2 hrs at time - at work - and in combination with taping  Kinesiotaping done this date with achor at R groin and 3 "fingers up to IA towards R axilla - but around radiation tattoos

## 2017-02-16 ENCOUNTER — Ambulatory Visit: Payer: 59 | Admitting: Occupational Therapy

## 2017-02-16 DIAGNOSIS — M79602 Pain in left arm: Secondary | ICD-10-CM

## 2017-02-16 DIAGNOSIS — M79601 Pain in right arm: Secondary | ICD-10-CM

## 2017-02-16 DIAGNOSIS — I972 Postmastectomy lymphedema syndrome: Secondary | ICD-10-CM | POA: Diagnosis not present

## 2017-02-16 DIAGNOSIS — M25612 Stiffness of left shoulder, not elsewhere classified: Secondary | ICD-10-CM

## 2017-02-16 DIAGNOSIS — M25611 Stiffness of right shoulder, not elsewhere classified: Secondary | ICD-10-CM

## 2017-02-16 DIAGNOSIS — M6281 Muscle weakness (generalized): Secondary | ICD-10-CM

## 2017-02-16 NOTE — Patient Instructions (Signed)
  Pt no able to tolerate compression Mediven harmony - phoned [facility] - pt to go by there afterwards and pick up different sleeve  Maybe Juzo that fits better and that pt can tolerate for longer period of time - mosty at work  Cut pt new tubigrip nr D and F to wear hand to elbow , and F forearm to upper arm  Fitted with new isotoner glove - xtra small   Same HEP  pt to wear compression sleeve and gauntlet when using exercises machine and work

## 2017-02-16 NOTE — Therapy (Signed)
[place] [address] [place], [address] Phone: [phone]   Fax:  [phone]  Occupational Therapy Treatment  Patient Details  Name: Sue Wong MRN: [id number] Date of Birth: 1966-11-27 Referring Provider: Freada Bergeron  Encounter Date: 02/16/2017      OT End of Session - 02/16/17 1420    Visit Number 8   Number of Visits 16   Date for OT Re-Evaluation 03/22/17   OT Start Time 1326   OT Stop Time 1416   OT Time Calculation (min) 50 min   Activity Tolerance Patient tolerated treatment well   Behavior During Therapy [facility] for tasks assessed/performed      No past medical history on file.  Past Surgical History:  Procedure Laterality Date  . BREAST SURGERY Bilateral 1/9 and 12/27/16    There were no vitals filed for this visit.      Subjective Assessment - 02/16/17 1327    Subjective  Yesterday did  not do any tape - today yes- yesterday sleeve for about 2 hrs and gauntlet - but then to tight - did tape over my chest from R to L - had some swelling in front of R shoulder   Patient Stated Goals I do not want a large arm  - and want to be able to raise my arms above my head - need to do radiation soon and be able to hold my arm in position    Currently in Pain? No/denies             LYMPHEDEMA/ONCOLOGY QUESTIONNAIRE - 02/16/17 1336      Right Upper Extremity Lymphedema   15 cm Proximal to Olecranon Process 33.5 cm   10 cm Proximal to Olecranon Process 32.5 cm   Olecranon Process 29.3 cm   15 cm Proximal to Ulnar Styloid Process 28 cm   10 cm Proximal to Ulnar Styloid Process 25 cm   Just Proximal to Ulnar Styloid Process 17.3 cm   Across Hand at [employer] 18.5 cm       Measured circumference of R UE -See flowsheet   Measurement increase again this date  Pt no able to tolerate compression Mediven harmony - phoned [facility] - pt to go by there afterwards and pick up  different sleeve  Maybe Juzo that fits better and that pt can tolerate for longer period of time - mosty at work  Cut pt new tubigrip nr D and F to wear hand to elbow , and F forearm to upper arm  Fitted with new isotoner glove - xtra small   Soft tissue mobs for bil Pect stretch  R axilla- scar mobs and massage prior to soft tissue mobs Done Xtrator 5 x on  2 of scars this date    R Shoulder  ABD and external rotation on pillow  AAROM and end range stretch AAROM by OT for bilateral shoulder flexion and ABD in supine With contract and relax end range Scapula squeezes on doorframe 10 reps  External rotation over head on pillow - L and R and end range contract and relax into pillow 8-10 reps   pt to wear compression sleeve and gauntlet when using exercises machine and work                        OT Education - 02/16/17 1420    Education provided Yes   Education Details compression wearing  Person(s) Educated Patient   Methods Explanation;Demonstration;Tactile cues;Verbal cues   Comprehension Verbal cues required;Returned demonstration;Verbalized understanding          OT Short Term Goals - 01/25/17 2113      OT SHORT TERM GOAL #1   Title Pt R shoulder AROM improve with at least 30 degrees  for pt to do hair, pull shirt over head and reach over head in cabinet   Baseline R  flexion 80 and ABD 60, external rotation impaired    Time 4   Period Weeks   Status New     OT SHORT TERM GOAL #2   Title L shoulder AROM improve to WNL to reach over head , use in bathing and dressing without increase symptoms    Baseline L shoulder ext 30, flexion 97 ABD 90 adn external rotation impaired   Time 4   Period Weeks   Status New     OT SHORT TERM GOAL #3   Title Pt ed on self MLD to do at home to decrease lymphedema circumference in R UE by 1 cm    Baseline no knowledge   Time 3   Period Weeks   Status New           OT Long Term Goals - 01/25/17 2119       OT LONG TERM GOAL #1   Title Pt to be ind in Homeprogram for compression and MLD to decrease and maintain lymphedema in R UE by 1-2 cm    Baseline increase by 2 cm in forearm, elbow 3.1 and upper arm 2.6 - no knowledge on lymphedema management   Time 5   Period Weeks   Status New     OT LONG TERM GOAL #2   Title Bilateral UE AROM improve to WNL to do be able to get into radiation position , and return to prior level of function in daily activities    Baseline see flowsheet   Time 6   Period Weeks   Status New     OT LONG TERM GOAL #3   Title assess [name]    Time 1   Period Weeks   Status New               Plan - 02/16/17 1421    Clinical Impression Statement Pt's measurements increase over this past weekend and week -because she did not do taping or able to do some compression like last week - send pt back to [facility] to get daytime compression sleeve and gauntllet that fits better  that she can tolerate at work - and to wear over the weekend and pt to do taping - will reassess next week - pt making great progress in ROM in bilateral UE    Rehab Potential Good   OT Frequency 2x / week   OT Duration 4 weeks   OT Treatment/Interventions Self-care/ADL training;Manual lymph drainage;Compression bandaging;Therapeutic exercises;Scar mobilization;Passive range of motion;Manual Therapy   Plan assess circumference and compression sleeve    OT Home Exercise Plan see pt instruction   Consulted and Agree with Plan of Care Patient      Patient will benefit from skilled therapeutic intervention in order to improve the following deficits and impairments:  Decreased range of motion, Impaired flexibility, Increased edema, Decreased scar mobility, Impaired UE functional use, Pain, Decreased strength, Decreased knowledge of precautions, Decreased knowledge of use of DME, Decreased skin integrity  Visit Diagnosis: Postmastectomy lymphedema syndrome  Stiffness of left  shoulder, not elsewhere classified  Stiffness of right shoulder, not elsewhere classified  Muscle weakness (generalized)  Pain in left arm  Pain in right arm    Problem List There are no active problems to display for this patient.   Rosalyn Gess  OTR/L,CLT 02/16/2017, 2:30 PM  [facility] [address] [place], [address] Phone: [phone]   Fax:  [phone]  Name: Sue Wong MRN: [id number] Date of Birth: 06-11-1966

## 2017-02-21 ENCOUNTER — Ambulatory Visit: Payer: 59 | Admitting: Occupational Therapy

## 2017-02-21 DIAGNOSIS — I972 Postmastectomy lymphedema syndrome: Secondary | ICD-10-CM | POA: Diagnosis not present

## 2017-02-21 DIAGNOSIS — M79601 Pain in right arm: Secondary | ICD-10-CM

## 2017-02-21 DIAGNOSIS — M6281 Muscle weakness (generalized): Secondary | ICD-10-CM

## 2017-02-21 DIAGNOSIS — M79602 Pain in left arm: Secondary | ICD-10-CM

## 2017-02-21 DIAGNOSIS — M25612 Stiffness of left shoulder, not elsewhere classified: Secondary | ICD-10-CM

## 2017-02-21 NOTE — Patient Instructions (Signed)
Pt to wear over the counter compression and gauntlet most all the time during day  Night time if want - isotoner glove and tubigrip Cont with    MLD- pt to use AAA above expanders 1.         Hug yourself at the base of your neck and do 8 small circles, and 2 fingers behind clavicle 8 x  2.         Do 8 semicircles at left armpit and right groin 3. Pump across chest from right to left 8 times 4. Pump down the right side of trunk from armpit to groin 8 times 5.         Pump up the outside of right upper arm 8 times, inside of upper arm to outside 8x, outside of upper arm again 8x  6.         Pump across chest from R to L 8 times 7. Pump  down the right side of trunk from armpit to groin 8 times 8.         Pump top of forearm from wrist to elbow 8 times 9.         Pump up the outside of right upper arm 8 times 10.       Pump across chest from right to left 8 times 11. Pump down the right side of trunk from armpit to groin 8 times 12.       Pump up the back of the forearm from wrist to elbow 8 times 13.       Pump up the outside of right upper arm 8 times 14. Pump across chest from right to left 8 times 15. Pump down the right side of trunk from armpit to groin 8 times 16. Do 8 semicircles at left armpit and right groin 8 times 17. Repeat nr.1   Taped kinesiotape from R posterior quadrant- achor at neck and 3 fingers to posterior  R upper arm as needed

## 2017-02-21 NOTE — Therapy (Signed)
[place] [address], [place], [address] Phone: [phone]   Fax:  [phone]  Occupational Therapy Treatment  Patient Details  Name: Sue Wong MRN: [id number] Date of Birth: 08/15/1966 Referring Provider: Freada Bergeron  Encounter Date: 02/21/2017      OT End of Session - 02/21/17 1411    Visit Number 9   Number of Visits 16   Date for OT Re-Evaluation 03/22/17   OT Start Time 1300   OT Stop Time 1347   OT Time Calculation (min) 47 min   Activity Tolerance Patient tolerated treatment well   Behavior During Therapy [facility] for tasks assessed/performed      No past medical history on file.  Past Surgical History:  Procedure Laterality Date  . BREAST SURGERY Bilateral 1/9 and 12/27/16    There were no vitals filed for this visit.      Subjective Assessment - 02/21/17 1401    Subjective  I did get other sleeve and gauntlet- and it feels much better - I can wear this one all day yesterday at work and today - did wrap my arm with exercises this weekend and slept with the isotoner glove and  tubigrip    Patient Stated Goals I do not want a large arm  - and want to be able to raise my arms above my head - need to do radiation soon and be able to hold my arm in position    Currently in Pain? No/denies             LYMPHEDEMA/ONCOLOGY QUESTIONNAIRE - 02/21/17 1305      Right Upper Extremity Lymphedema   15 cm Proximal to Olecranon Process 33.5 cm   10 cm Proximal to Olecranon Process 31.7 cm   Olecranon Process 28.4 cm   15 cm Proximal to Ulnar Styloid Process 27.5 cm   10 cm Proximal to Ulnar Styloid Process 25 cm   Just Proximal to Ulnar Styloid Process 17 cm   Across Hand at [employer] 18.3 cm       Measured R UE circumference - see flow sheet  Assess fit of over the counter compression sleeve - Juzo 20-30 mmHg to R arm - and gauntlet  Looks right size and length   Pt  decrease but arm feels fuller than L    Done MLD with pt and educated again  - pt to use AAA above expanders 1.         Hug yourself at the base of your neck and do 8 small circles, and 2 fingers behind clavicle 8 x  2.         Do 8 semicircles at left armpit and right groin 3. Pump across chest from right to left 8 times 4. Pump down the right side of trunk from armpit to groin 8 times 5.         Pump up the outside of right upper arm 8 times, inside of upper arm to outside 8x, outside of upper arm again 8x  6.         Pump across chest from R to L 8 times 7. Pump  down the right side of trunk from armpit to groin 8 times 8.         Pump top of forearm from wrist to elbow 8 times 9.         Pump up the outside of right upper arm 8  times 10.       Pump across chest from right to left 8 times 11. Pump down the right side of trunk from armpit to groin 8 times 12.       Pump up the back of the forearm from wrist to elbow 8 times 13.       Pump up the outside of right upper arm 8 times 14. Pump across chest from right to left 8 times 15. Pump down the right side of trunk from armpit to groin 8 times 16. Do 8 semicircles at left armpit and right groin 8 times 17. Repeat nr.1                      OT Education - 02/21/17 1410    Education provided Yes   Education Details compression sleeve fit, MLD and taping   Person(s) Educated Patient   Methods Explanation;Tactile cues;Verbal cues;Demonstration   Comprehension Verbal cues required;Verbalized understanding;Returned demonstration          OT Short Term Goals - 02/21/17 1413      OT SHORT TERM GOAL #1   Title Pt R shoulder AROM improve with at least 30 degrees  for pt to do hair, pull shirt over head and reach over head in cabinet   Baseline improve greatly  still trouble pulling off shirt over head   Time 3   Period Weeks   Status On-going     OT SHORT TERM GOAL #2   Title L shoulder AROM improve to WNL to reach  over head , use in bathing and dressing without increase symptoms    Baseline great progress - still impaired in last range of motion   Time 3   Period Weeks   Status On-going     OT SHORT TERM GOAL #3   Title Pt ed on self MLD to do at home to decrease lymphedema circumference in R UE by 1 cm    Status Achieved           OT Long Term Goals - 02/21/17 1414      OT LONG TERM GOAL #1   Title Pt to be ind in Homeprogram for compression and MLD to decrease and maintain lymphedema in R UE by 1-2 cm    Baseline fitted with compression sleeve - still monitor , MLD , and taping - radiation starting tomorrow   Time 3   Period Weeks   Status On-going     OT LONG TERM GOAL #2   Title Bilateral UE AROM improve to WNL to do be able to get into radiation position , and return to prior level of function in daily activities    Baseline Can get in radiation position - but not back to prior level    Time 3   Period Weeks   Status On-going     OT LONG TERM GOAL #3   Title assess [facility]    Baseline to be done next session   Time 1   Period Weeks   Status On-going               Plan - 02/21/17 1411    Clinical Impression Statement Pt's R UE measurements decreased - pt did get better compression sleeve to wear and can tolerate better - pt to cont with taping as needed - radiation starting tomorrow - cont to monitor lymphedema and cont to increase ROM    Rehab Potential Good   OT  Frequency 2x / week   OT Duration 4 weeks   OT Treatment/Interventions Self-care/ADL training;Manual lymph drainage;Compression bandaging;Therapeutic exercises;Scar mobilization;Passive range of motion;Manual Therapy   Plan assess circumference with use of compression and taping - and MLD   OT Home Exercise Plan see pt instruction   Consulted and Agree with Plan of Care Patient      Patient will benefit from skilled therapeutic intervention in order to improve the following deficits and impairments:   Decreased range of motion, Impaired flexibility, Increased edema, Decreased scar mobility, Impaired UE functional use, Pain, Decreased strength, Decreased knowledge of precautions, Decreased knowledge of use of DME, Decreased skin integrity  Visit Diagnosis: Postmastectomy lymphedema syndrome  Stiffness of left shoulder, not elsewhere classified  Muscle weakness (generalized)  Pain in left arm  Pain in right arm    Problem List There are no active problems to display for this patient.   Paiton Fosco OTR/L, CLT 02/21/2017, 2:16 PM  [place] [facility] [address] [place], [address] Phone: [phone]   Fax:  [phone]  Name: Sue Wong MRN: [id number] Date of Birth: 24-Nov-1966

## 2017-02-23 ENCOUNTER — Ambulatory Visit: Payer: 59 | Attending: Nurse Practitioner | Admitting: Occupational Therapy

## 2017-02-23 DIAGNOSIS — M25611 Stiffness of right shoulder, not elsewhere classified: Secondary | ICD-10-CM | POA: Insufficient documentation

## 2017-02-23 DIAGNOSIS — M25612 Stiffness of left shoulder, not elsewhere classified: Secondary | ICD-10-CM | POA: Insufficient documentation

## 2017-02-23 DIAGNOSIS — M6281 Muscle weakness (generalized): Secondary | ICD-10-CM

## 2017-02-23 DIAGNOSIS — M79602 Pain in left arm: Secondary | ICD-10-CM | POA: Diagnosis present

## 2017-02-23 DIAGNOSIS — I972 Postmastectomy lymphedema syndrome: Secondary | ICD-10-CM | POA: Diagnosis not present

## 2017-02-23 NOTE — Patient Instructions (Addendum)
   R Shoulder  Flexion and ABD  Stretch  On wall  tricep stretch  Child pose for bilateral flexion - lats stretch on mat - 5 reps - including sideways L and R  Pect stretch in doorway -stepping thru - 10 reps  Anterior  Capsule stretch  L and R - increase stretch on R  AROM for shoulder ABD  pt to wear compression sleeve and gauntlet daily and taping PAA as needed  As well as isotoner glove and tubigrip at night time as needed

## 2017-02-23 NOTE — Therapy (Signed)
[facility] [address], [place], [address] Phone: [phone]   Fax:  [phone]  Occupational Therapy Treatment  Patient Details  Name: Sue Wong MRN: [id number] Date of Birth: 08-18-1966 Referring Provider: Freada Bergeron  Encounter Date: 02/23/2017      OT End of Session - 02/23/17 8756    Visit Number 10   Number of Visits 16   Date for OT Re-Evaluation 03/22/17   OT Start Time 1345   OT Stop Time 1440   OT Time Calculation (min) 55 min   Activity Tolerance Patient tolerated treatment well   Behavior During Therapy [facility] for tasks assessed/performed      No past medical history on file.  Past Surgical History:  Procedure Laterality Date  . BREAST SURGERY Bilateral 1/9 and 12/27/16    There were no vitals filed for this visit.      Subjective Assessment - 02/23/17 1609    Subjective  They did not do radiation yesterday - could not get me in the right position because of stiffness - but felt they had me tilted back more - it was long day and had to go up to PT late afternoon - she gave me 3 exercises and said you to check them - was able to do radiation this am - but I did stretches last night and this am early - took muscle relaxers and advil    Patient Stated Goals I do not want a large arm  - and want to be able to raise my arms above my head - need to do radiation soon and be able to hold my arm in position    Currently in Pain? No/denies            [facility] OT Assessment - 02/23/17 0001      AROM   Right Shoulder Extension 68 Degrees   Right Shoulder Flexion 140 Degrees   Right Shoulder ABduction 140 Degrees   Left Shoulder Extension 60 Degrees   Left Shoulder Flexion 150 Degrees   Left Shoulder ABduction 146 Degrees         LYMPHEDEMA/ONCOLOGY QUESTIONNAIRE - 02/23/17 1348      Right Upper Extremity Lymphedema   15 cm Proximal to Olecranon Process 33 cm   10 cm Proximal  to Olecranon Process 31.5 cm   Olecranon Process 28 cm   15 cm Proximal to Ulnar Styloid Process 27.5 cm   10 cm Proximal to Ulnar Styloid Process 25 cm   Just Proximal to Ulnar Styloid Process 16.8 cm   Across Hand at [employer] 18.5 cm   At [place] of 2nd Digit 6 cm   At [facility] of Thumb 6.4 cm      Measured R UE circumference - see flow sheet  Pt did wear since last time over the counter compression sleeve - Juzo 20-30 mmHg to R arm - and gauntlet during day  Did not do any taping or anything at night time    In supine did R axilla- scar mobs and massage prior to soft tissue mobs   R Shoulder  Flexion and ABD  Stretch end range and then contract and relax end range   Bilateral shoulder flexion and ABD stretch in supine With contract and relax end range Child pose for bilateral flexion - lats stretch on mat - 5 reps - including sideways L and R  Pect stretch in doorway -stepping thru - 10 reps  Anterior  Capsule stretch  L and R - increase stretch on R  AROM for shoulder ABD  pt to wear compression sleeve and gauntlet daily and taping PAA as needed  As well as isotoner glove and tubigrip at night time as needed                       OT Education - 02/23/17 1733    Education provided Yes   Education Details compression sleeve wearing - HEP for ROM    Person(s) Educated Patient   Methods Explanation;Demonstration;Tactile cues;Verbal cues;Handout   Comprehension Verbal cues required;Returned demonstration;Verbalized understanding          OT Short Term Goals - 02/21/17 1413      OT SHORT TERM GOAL #1   Title Pt R shoulder AROM improve with at least 30 degrees  for pt to do hair, pull shirt over head and reach over head in cabinet   Baseline improve greatly  still trouble pulling off shirt over head   Time 3   Period Weeks   Status On-going     OT SHORT TERM GOAL #2   Title L shoulder AROM improve to WNL to reach over head , use in bathing and  dressing without increase symptoms    Baseline great progress - still impaired in last range of motion   Time 3   Period Weeks   Status On-going     OT SHORT TERM GOAL #3   Title Pt ed on self MLD to do at home to decrease lymphedema circumference in R UE by 1 cm    Status Achieved           OT Long Term Goals - 02/21/17 1414      OT LONG TERM GOAL #1   Title Pt to be ind in Homeprogram for compression and MLD to decrease and maintain lymphedema in R UE by 1-2 cm    Baseline fitted with compression sleeve - still monitor , MLD , and taping - radiation starting tomorrow   Time 3   Period Weeks   Status On-going     OT LONG TERM GOAL #2   Title Bilateral UE AROM improve to WNL to do be able to get into radiation position , and return to prior level of function in daily activities    Baseline Can get in radiation position - but not back to prior level    Time 3   Period Weeks   Status On-going     OT LONG TERM GOAL #3   Title assess Quickdash    Baseline to be done next session   Time 1   Period Weeks   Status On-going               Plan - 02/23/17 1739    Rehab Potential Good   OT Frequency 2x / week   OT Duration 4 weeks   OT Treatment/Interventions Self-care/ADL training;Manual lymph drainage;Compression bandaging;Therapeutic exercises;Scar mobilization;Passive range of motion;Manual Therapy   Plan assess circumference with radiation and progress in ROM with new HEP    OT Home Exercise Plan see pt instruction   Consulted and Agree with Plan of Care Patient      Patient will benefit from skilled therapeutic intervention in order to improve the following deficits and impairments:  Decreased range of motion, Impaired flexibility, Increased edema, Decreased scar mobility, Impaired UE functional use, Pain, Decreased strength, Decreased knowledge of precautions, Decreased knowledge  of use of DME, Decreased skin integrity  Visit Diagnosis: Postmastectomy  lymphedema syndrome  Stiffness of left shoulder, not elsewhere classified  Muscle weakness (generalized)  Pain in left arm  Stiffness of right shoulder, not elsewhere classified    Problem List There are no active problems to display for this patient.   Rosalyn Gess OTR/L,CLT 02/23/2017, 5:48 PM  [place] [facility] [address] [place], [address] Phone: [phone]   Fax:  [phone]  Name: ADDALEIGH NICHOLLS MRN: [id number] Date of Birth: [birth year]

## 2017-02-27 ENCOUNTER — Ambulatory Visit: Payer: 59 | Admitting: Occupational Therapy

## 2017-02-27 DIAGNOSIS — M25612 Stiffness of left shoulder, not elsewhere classified: Secondary | ICD-10-CM

## 2017-02-27 DIAGNOSIS — M79601 Pain in right arm: Secondary | ICD-10-CM

## 2017-02-27 DIAGNOSIS — M25611 Stiffness of right shoulder, not elsewhere classified: Secondary | ICD-10-CM

## 2017-02-27 DIAGNOSIS — I972 Postmastectomy lymphedema syndrome: Secondary | ICD-10-CM

## 2017-02-27 DIAGNOSIS — M6281 Muscle weakness (generalized): Secondary | ICD-10-CM

## 2017-02-27 DIAGNOSIS — M79602 Pain in left arm: Secondary | ICD-10-CM

## 2017-02-27 NOTE — Therapy (Signed)
[place] [facility] [address], [place], [address] Phone: [phone]   Fax:  [phone]  Occupational Therapy Treatment  Patient Details  Name: Sue Wong MRN: [id number] Date of Birth: June 12, 1966 Referring Provider: Freada Bergeron  Encounter Date: 02/27/2017      OT End of Session - 02/27/17 1358    Visit Number 11   Number of Visits 16   Date for OT Re-Evaluation 03/22/17   OT Start Time 1257   OT Stop Time 1351   OT Time Calculation (min) 54 min   Activity Tolerance Patient tolerated treatment well   Behavior During Therapy [facility] for tasks assessed/performed      No past medical history on file.  Past Surgical History:  Procedure Laterality Date  . BREAST SURGERY Bilateral 1/9 and 12/27/16    There were no vitals filed for this visit.      Subjective Assessment - 02/27/17 1300    Subjective  I felt l like my lymphedema is worse- did not tape because they said burning will be to back too - did not bandage - did wear tubigrip and glove  over at night   Patient Stated Goals I do not want a large arm  - and want to be able to raise my arms above my head - need to do radiation soon and be able to hold my arm in position    Currently in Pain? No/denies             LYMPHEDEMA/ONCOLOGY QUESTIONNAIRE - 02/27/17 1302      Right Upper Extremity Lymphedema   15 cm Proximal to Olecranon Process 32.5 cm   10 cm Proximal to Olecranon Process 31.5 cm   Olecranon Process 28.5 cm   15 cm Proximal to Ulnar Styloid Process 27.8 cm   10 cm Proximal to Ulnar Styloid Process 25 cm   Just Proximal to Ulnar Styloid Process 16.7 cm   Across Hand at [employer] 18.3 cm   At [place] of 2nd Digit 6.4 cm   At [facility] of Thumb 6.5 cm      Measured R UE circumference - see flow sheet  Pt did wear over the counter compression sleeve every day- Juzo 20-30 mmHg to R arm - and gauntlet   Did not do any taping or  bandaging  Pt report upon flexion and ABD slides on wall R and L - pull over tricep, serratus, lats on R , L in axilla and pect  pect stretch on door - pt compensate on R - shoulder forwards and lower than L    In supine did Graston tool nr 2 in R axilla- brushing at top scar , tricep ,  And posterior Upper arm  L Graston tool nr 4 in axilla and over tricep -with AAROM and PROM  With shoulder into flexion and ABD end range   Soft tissue stretch by OT on lats while pt to AAROM ABD end range  Pt repeat AAROM on wall for shoulder ABD and flexion - less compensation and less pull  Child pose for bilateral flexion - lats stretch on mat - 5 reps - including sideways L and R  - pt to stay back and reach  Pect stretch in doorway -stepping thru - 10 reps repeat- pt shoulder stayed same height - and not forward Anterior  Capsule stretch  L and R - increase stretch on R   Scapula retraction 10 reps  RTB for retraction  2 x 10 reps - with sleeve on   pt to wear compression sleeve and gauntlet daily and taping PAA or up to neck <> upper arm  as needed  As well as isotoner glove and tubigrip at night time as needed                     OT Education - 02/27/17 1358    Education provided Yes   Education Details HEP and add RTB for retraction    Person(s) Educated Patient   Methods Explanation;Demonstration;Tactile cues;Verbal cues   Comprehension Verbal cues required;Returned demonstration;Verbalized understanding          OT Short Term Goals - 02/21/17 1413      OT SHORT TERM GOAL #1   Title Pt R shoulder AROM improve with at least 30 degrees  for pt to do hair, pull shirt over head and reach over head in cabinet   Baseline improve greatly  still trouble pulling off shirt over head   Time 3   Period Weeks   Status On-going     OT SHORT TERM GOAL #2   Title L shoulder AROM improve to WNL to reach over head , use in bathing and dressing without increase symptoms     Baseline great progress - still impaired in last range of motion   Time 3   Period Weeks   Status On-going     OT SHORT TERM GOAL #3   Title Pt ed on self MLD to do at home to decrease lymphedema circumference in R UE by 1 cm    Status Achieved           OT Long Term Goals - 02/21/17 1414      OT LONG TERM GOAL #1   Title Pt to be ind in Homeprogram for compression and MLD to decrease and maintain lymphedema in R UE by 1-2 cm    Baseline fitted with compression sleeve - still monitor , MLD , and taping - radiation starting tomorrow   Time 3   Period Weeks   Status On-going     OT LONG TERM GOAL #2   Title Bilateral UE AROM improve to WNL to do be able to get into radiation position , and return to prior level of function in daily activities    Baseline Can get in radiation position - but not back to prior level    Time 3   Period Weeks   Status On-going     OT LONG TERM GOAL #3   Title assess Quickdash    Baseline to be done next session   Time 1   Period Weeks   Status On-going               Plan - 02/27/17 1400    Clinical Impression Statement Pt R UE measurements staying the same than last time - one forearm increase by .5 cm - pt to wear compression every day - and taping and MLD as can - show great progress in ROM at bilateral shoulders - pt report more ease with radiation today    Rehab Potential Good   OT Frequency 2x / week   OT Duration 4 weeks   OT Treatment/Interventions Self-care/ADL training;Manual lymph drainage;Compression bandaging;Therapeutic exercises;Scar mobilization;Passive range of motion;Manual Therapy   Plan assess circumference and ROM , scar    OT Home Exercise Plan see pt instruction   Consulted and Agree with Plan  of Care Patient      Patient will benefit from skilled therapeutic intervention in order to improve the following deficits and impairments:  Decreased range of motion, Impaired flexibility, Increased edema, Decreased scar  mobility, Impaired UE functional use, Pain, Decreased strength, Decreased knowledge of precautions, Decreased knowledge of use of DME, Decreased skin integrity  Visit Diagnosis: Postmastectomy lymphedema syndrome  Stiffness of left shoulder, not elsewhere classified  Muscle weakness (generalized)  Pain in left arm  Stiffness of right shoulder, not elsewhere classified  Pain in right arm    Problem List There are no active problems to display for this patient.   Rosalyn Gess OTR,L,CLT 02/27/2017, 2:03 PM  [place] [address] [place], [address] Phone: [phone]   Fax:  [phone]  Name: TANEIKA CHOI MRN: [id number] Date of Birth: July 05, 1966

## 2017-02-27 NOTE — Patient Instructions (Signed)
Same HEP - but to do child pose prior to pect stretch in door Add scapula squeezes and now with RTB - 2 x 12 reps  But with sleeve on   pt to wear compression sleeve and gauntlet daily and taping PAA or up to neck <> upper arm  as needed  As well as isotoner glove and tubigrip at night time as needed

## 2017-03-02 ENCOUNTER — Ambulatory Visit: Payer: 59 | Admitting: Occupational Therapy

## 2017-03-07 ENCOUNTER — Ambulatory Visit: Payer: 59 | Attending: Nurse Practitioner | Admitting: Occupational Therapy

## 2017-03-07 DIAGNOSIS — M25611 Stiffness of right shoulder, not elsewhere classified: Secondary | ICD-10-CM | POA: Diagnosis present

## 2017-03-07 DIAGNOSIS — M79601 Pain in right arm: Secondary | ICD-10-CM | POA: Diagnosis present

## 2017-03-07 DIAGNOSIS — I972 Postmastectomy lymphedema syndrome: Secondary | ICD-10-CM

## 2017-03-07 DIAGNOSIS — M79602 Pain in left arm: Secondary | ICD-10-CM | POA: Diagnosis present

## 2017-03-07 DIAGNOSIS — M6281 Muscle weakness (generalized): Secondary | ICD-10-CM | POA: Insufficient documentation

## 2017-03-07 DIAGNOSIS — M25612 Stiffness of left shoulder, not elsewhere classified: Secondary | ICD-10-CM | POA: Diagnosis present

## 2017-03-07 NOTE — Patient Instructions (Signed)
Pt to cont with at least child pose for lats and shoulder flexion stretch  tricep stretch  Pect major in doorway stretch   Tape if possible at PAA where radiation is not done for trunkle lymphedema Make sure if order compression garment that it has silicon band at top to keep it up

## 2017-03-07 NOTE — Therapy (Signed)
[place] [facility] [address] [place], [address] Phone: [phone]   Fax:  [phone]  Occupational Therapy Treatment  Patient Details  Name: Sue Wong MRN: [id number] Date of Birth: December 12, 1965 Referring Provider: Freada Bergeron  Encounter Date: 03/07/2017      OT End of Session - 03/07/17 1938    Visit Number 12   Number of Visits 16   Date for OT Re-Evaluation 03/22/17   OT Start Time 1950   OT Stop Time 1349   OT Time Calculation (min) 42 min   Activity Tolerance Patient tolerated treatment well   Behavior During Therapy [facility] for tasks assessed/performed      No past medical history on file.  Past Surgical History:  Procedure Laterality Date  . BREAST SURGERY Bilateral 1/9 and 12/27/16    There were no vitals filed for this visit.      Subjective Assessment - 03/07/17 1313    Subjective  did some biking, shopping - was not do as much stretches or massage- wear my sleeve all day - with the lotion did not tape   Patient Stated Goals I do not want a large arm  - and want to be able to raise my arms above my head - need to do radiation soon and be able to hold my arm in position    Currently in Pain? No/denies            [facility] OT Assessment - 03/07/17 0001      AROM   Right Shoulder Extension 68 Degrees   Right Shoulder Flexion 148 Degrees   Right Shoulder ABduction 140 Degrees   Left Shoulder Extension 68 Degrees   Left Shoulder Flexion 150 Degrees   Left Shoulder ABduction 150 Degrees         LYMPHEDEMA/ONCOLOGY QUESTIONNAIRE - 03/07/17 1316      Right Upper Extremity Lymphedema   15 cm Proximal to Olecranon Process 33.3 cm   10 cm Proximal to Olecranon Process 31.8 cm   Olecranon Process 28 cm   15 cm Proximal to Ulnar Styloid Process 27.7 cm   10 cm Proximal to Ulnar Styloid Process 25 cm   Just Proximal to Ulnar Styloid Process 16.1 cm   Across Hand at [employer] 18  cm   At [place] of 2nd Digit 6.5 cm   At [facility] of Thumb 6 cm       Measured R UE circumference - see flow sheet  Pt did wear compression sleeve  For last 2 days she ordered online - it slid down - do not have silicon band - pt ed on correct sleeve - pt upper arm circumference was increased   Measured ROM at bilateral shoulders - see flowsheet Pt report she gets in radiation machine and position with ease   In sidelying did Graston tool nr 2 in  Bilateral  Axilla with R focus on scars -  brushing at top scar , tricep ,  And posterior Upper arm ;  axilla and over tricep -with AAROM and PROM  With shoulder into flexion and ABD end range  - gentle with radiation  Soft tissue stretch by OT on lats while pt to AAROM ABD end range  l Pt to cont with at least child pose for lats and shoulder flexion stretch  tricep stretch  Pect major in doorway stretch   Tape if possible at PAA where radiation is not done for trunkle  lymphedema Make sure if order compression garment that it has silicon band at top to keep it up    Scapula retraction 10 reps  RTB for retraction  2 x 10 reps - with sleeve on  To cont with   pt to wear compression sleeve and gauntlet daily and taping PAA or up to neck <> upper arm  as needed  As well as isotoner glove and tubigrip at night time as needed                      OT Education - 03/07/17 1938    Education provided Yes   Education Details HEP for stretches what to focus if not time - and correct compression garments   Person(s) Educated Patient   Methods Explanation;Demonstration;Tactile cues;Verbal cues   Comprehension Verbal cues required;Returned demonstration;Verbalized understanding          OT Short Term Goals - 02/21/17 1413      OT SHORT TERM GOAL #1   Title Pt R shoulder AROM improve with at least 30 degrees  for pt to do hair, pull shirt over head and reach over head in cabinet   Baseline improve greatly  still trouble  pulling off shirt over head   Time 3   Period Weeks   Status On-going     OT SHORT TERM GOAL #2   Title L shoulder AROM improve to WNL to reach over head , use in bathing and dressing without increase symptoms    Baseline great progress - still impaired in last range of motion   Time 3   Period Weeks   Status On-going     OT SHORT TERM GOAL #3   Title Pt ed on self MLD to do at home to decrease lymphedema circumference in R UE by 1 cm    Status Achieved           OT Long Term Goals - 02/21/17 1414      OT LONG TERM GOAL #1   Title Pt to be ind in Homeprogram for compression and MLD to decrease and maintain lymphedema in R UE by 1-2 cm    Baseline fitted with compression sleeve - still monitor , MLD , and taping - radiation starting tomorrow   Time 3   Period Weeks   Status On-going     OT LONG TERM GOAL #2   Title Bilateral UE AROM improve to WNL to do be able to get into radiation position , and return to prior level of function in daily activities    Baseline Can get in radiation position - but not back to prior level    Time 3   Period Weeks   Status On-going     OT LONG TERM GOAL #3   Title assess Quickdash    Baseline to be done next session   Time 1   Period Weeks   Status On-going               Plan - 03/07/17 1939    Clinical Impression Statement Pt's R UE measurements decrease at hand and wrist as well as forearm - upper arm increase by pt come in with compression garment she ordered online and it slid down - pt did wear it for 2 days - pt to wear compression sleeve with silicon band - and gauntlet - ROM showed great progress - and pt to cont with ROM and stretches    Rehab Potential  Good   OT Frequency 1x / week   OT Duration 4 weeks   OT Treatment/Interventions Self-care/ADL training;Manual lymph drainage;Compression bandaging;Therapeutic exercises;Scar mobilization;Passive range of motion;Manual Therapy   Plan assess circumference , compression  and ROM    OT Home Exercise Plan see pt instruction   Consulted and Agree with Plan of Care Patient      Patient will benefit from skilled therapeutic intervention in order to improve the following deficits and impairments:  Decreased range of motion, Impaired flexibility, Increased edema, Decreased scar mobility, Impaired UE functional use, Pain, Decreased strength, Decreased knowledge of precautions, Decreased knowledge of use of DME, Decreased skin integrity  Visit Diagnosis: Postmastectomy lymphedema syndrome  Stiffness of left shoulder, not elsewhere classified  Muscle weakness (generalized)  Pain in left arm  Stiffness of right shoulder, not elsewhere classified  Pain in right arm    Problem List There are no active problems to display for this patient.   Rosalyn Gess  OTR/L,CLT 03/07/2017, 7:42 PM  [place] [facility] [address], [place], [address] Phone: [phone]   Fax:  [phone]  Name: Sue Wong MRN: [id number] Date of Birth: Jan 11, 1966

## 2017-03-16 ENCOUNTER — Ambulatory Visit: Payer: 59 | Admitting: Occupational Therapy

## 2017-03-16 DIAGNOSIS — I972 Postmastectomy lymphedema syndrome: Secondary | ICD-10-CM | POA: Diagnosis not present

## 2017-03-16 DIAGNOSIS — M79601 Pain in right arm: Secondary | ICD-10-CM

## 2017-03-16 DIAGNOSIS — M6281 Muscle weakness (generalized): Secondary | ICD-10-CM

## 2017-03-16 DIAGNOSIS — M25611 Stiffness of right shoulder, not elsewhere classified: Secondary | ICD-10-CM

## 2017-03-16 DIAGNOSIS — M79602 Pain in left arm: Secondary | ICD-10-CM

## 2017-03-16 DIAGNOSIS — M25612 Stiffness of left shoulder, not elsewhere classified: Secondary | ICD-10-CM

## 2017-03-16 NOTE — Patient Instructions (Signed)
Pt to cont with daytime compression sleeve and gauntlet And night time do isotoner glove and D tubigrip from hand to elbow - if can bandage one bandage from hand to upper arm for week

## 2017-03-16 NOTE — Therapy (Signed)
[facility] [address], [place], [address] Phone: [phone]   Fax:  [phone]  Occupational Therapy Treatment  Patient Details  Name: Sue Wong MRN: [id number] Date of Birth: 26-Mar-1966 Referring Provider: Freada Bergeron  Encounter Date: 03/16/2017      OT End of Session - 03/16/17 1755    Visit Number 13   Number of Visits 16   Date for OT Re-Evaluation 03/22/17   OT Start Time 1600   OT Stop Time 1628   OT Time Calculation (min) 28 min   Activity Tolerance Patient tolerated treatment well   Behavior During Therapy [facility] for tasks assessed/performed      No past medical history on file.  Past Surgical History:  Procedure Laterality Date  . BREAST SURGERY Bilateral 1/9 and 12/27/16    There were no vitals filed for this visit.      Subjective Assessment - 03/16/17 1752    Subjective  Doing okay - have little red spots but skin not sore from radiation yet - my arm feels more heavy and look more swollen - I did get these new sleeves with some patterns and color  - it is the same compression than tan ones - I did not wear anything at night time    Patient Stated Goals I do not want a large arm  - and want to be able to raise my arms above my head - need to do radiation soon and be able to hold my arm in position    Currently in Pain? No/denies             LYMPHEDEMA/ONCOLOGY QUESTIONNAIRE - 03/16/17 1603      Right Upper Extremity Lymphedema   15 cm Proximal to Olecranon Process 33.3 cm   10 cm Proximal to Olecranon Process 31.8 cm   Olecranon Process 28.2 cm   15 cm Proximal to Ulnar Styloid Process 27.5 cm   10 cm Proximal to Ulnar Styloid Process 25.2 cm   Just Proximal to Ulnar Styloid Process 16.5 cm   Across Hand at [employer] 18.5 cm   At [place] of 2nd Digit 6.8 cm   At [facility] of Thumb 6 cm      Pt arrive with new juzo daytime compression with print - pt report it is  same compression than tan one - pt wearing it daily  But nothing at night time  Measurements taken R UE - see flow sheet   compare to LUE and discuss with pt  Forearm increase by 2.2cm  recommend wearing night time some compression with isotoner glove ,tubigrip D hand to elbow and do one layer bandaging hand to upper arm  Will reassess next week   Pt to cont with daytime compression sleeve and gauntlet And night time do isotoner glove and D tubigrip from hand to elbow - if can bandage one bandage from hand to upper arm for week                     OT Education - 03/16/17 1754    Education provided Yes   Education Details Homeprogram changes - to bandage arm at night time over tubigrip and Management consultant) Educated Patient   Methods Explanation;Demonstration   Comprehension Returned demonstration;Verbalized understanding          OT Short Term Goals - 02/21/17 1413      OT SHORT TERM GOAL #  1   Title Pt R shoulder AROM improve with at least 30 degrees  for pt to do hair, pull shirt over head and reach over head in cabinet   Baseline improve greatly  still trouble pulling off shirt over head   Time 3   Period Weeks   Status On-going     OT SHORT TERM GOAL #2   Title L shoulder AROM improve to WNL to reach over head , use in bathing and dressing without increase symptoms    Baseline great progress - still impaired in last range of motion   Time 3   Period Weeks   Status On-going     OT SHORT TERM GOAL #3   Title Pt ed on self MLD to do at home to decrease lymphedema circumference in R UE by 1 cm    Status Achieved           OT Long Term Goals - 02/21/17 1414      OT LONG TERM GOAL #1   Title Pt to be ind in Homeprogram for compression and MLD to decrease and maintain lymphedema in R UE by 1-2 cm    Baseline fitted with compression sleeve - still monitor , MLD , and taping - radiation starting tomorrow   Time 3   Period Weeks   Status  On-going     OT LONG TERM GOAL #2   Title Bilateral UE AROM improve to WNL to do be able to get into radiation position , and return to prior level of function in daily activities    Baseline Can get in radiation position - but not back to prior level    Time 3   Period Weeks   Status On-going     OT LONG TERM GOAL #3   Title assess Quickdash    Baseline to be done next session   Time 1   Period Weeks   Status On-going               Plan - 03/16/17 1755    Clinical Impression Statement Pt R UE measurements stayed about the same than week ago - in 3rd week of radiation - compare to L UE - pt increase by 2.2 at distal forearm - all others increase less than 1.4 cm - pt to cont with daytime compression but  for week do night time bandaging over isotoner glove and tubigrip to elbow -to decrease circumference    Rehab Potential Good   OT Frequency 1x / week   OT Duration 2 weeks   OT Treatment/Interventions Self-care/ADL training;Manual lymph drainage;Compression bandaging;Therapeutic exercises;Scar mobilization;Passive range of motion;Manual Therapy   Plan measure if circumference decrease with bandaging for week at night time    OT Home Exercise Plan see pt instruction   Consulted and Agree with Plan of Care Patient      Patient will benefit from skilled therapeutic intervention in order to improve the following deficits and impairments:  Decreased range of motion, Impaired flexibility, Increased edema, Decreased scar mobility, Impaired UE functional use, Pain, Decreased strength, Decreased knowledge of precautions, Decreased knowledge of use of DME, Decreased skin integrity  Visit Diagnosis: Postmastectomy lymphedema syndrome  Stiffness of left shoulder, not elsewhere classified  Muscle weakness (generalized)  Pain in left arm  Stiffness of right shoulder, not elsewhere classified  Pain in right arm    Problem List There are no active problems to display for this  patient.   Rosalyn Gess OTR/L,CLT 03/16/2017,  5:59 PM  [facility] [address] [place], [address] Phone: [phone]   Fax:  [phone]  Name: Sue Wong MRN: [id number] Date of Birth: [birth year]

## 2017-03-23 ENCOUNTER — Ambulatory Visit: Payer: 59 | Admitting: Occupational Therapy

## 2017-03-23 DIAGNOSIS — I972 Postmastectomy lymphedema syndrome: Secondary | ICD-10-CM

## 2017-03-23 NOTE — Patient Instructions (Signed)
Same

## 2017-03-23 NOTE — Therapy (Signed)
[facility] [address] [place], [address] Phone: [phone]   Fax:  [phone]  Occupational Therapy Treatment  Patient Details  Name: Sue Wong MRN: [id number] Date of Birth: 04-08-1966 Referring Provider: Freada Bergeron  Encounter Date: 03/23/2017      OT End of Session - 03/23/17 1941    Visit Number 14   Number of Visits 16   Date for OT Re-Evaluation 03/22/17   OT Start Time 7322   OT Stop Time 1611   OT Time Calculation (min) 27 min   Activity Tolerance Patient tolerated treatment well   Behavior During Therapy [facility] for tasks assessed/performed      No past medical history on file.  Past Surgical History:  Procedure Laterality Date  . BREAST SURGERY Bilateral 1/9 and 12/27/16    There were no vitals filed for this visit.      Subjective Assessment - 03/23/17 1935    Subjective  I am finish with radiation next Wed - my skin is gettting more red and little tender - my mom in ICU - had pacemaker - so did not do any bandaging the last 2 nights   Patient Stated Goals I do not want a large arm  - and want to be able to raise my arms above my head - need to do radiation soon and be able to hold my arm in position    Currently in Pain? No/denies             LYMPHEDEMA/ONCOLOGY QUESTIONNAIRE - 03/23/17 1544      Right Upper Extremity Lymphedema   15 cm Proximal to Olecranon Process 33 cm   10 cm Proximal to Olecranon Process 32 cm   Olecranon Process 28.2 cm   15 cm Proximal to Ulnar Styloid Process 27.2 cm   10 cm Proximal to Ulnar Styloid Process 24.8 cm   Just Proximal to Ulnar Styloid Process 16.8 cm   Across Hand at [employer] 18.2 cm   At [place] of 2nd Digit 6.2 cm   At [facility] of Thumb 6 cm       Pt arrive with  juzo daytime compression with print - pt report it is same compression than tan one - pt wearing it daily  Did bandage one layer 5 nights - but not last 2 -  her mom in ICU Measurements taken R UE - see flow sheet   compare to LUE and discuss with pt again  Forearm increase by more than rest of arm   recommend still wearing night time some compression with isotoner glove ,tubigrip D hand to elbow and do one layer bandaging hand to upper arm  Will reassess in 2 wks - radiation will be finish next week  Pt to cont with daytime compression sleeve and gauntlet And night time do isotoner glove and D tubigrip from hand to elbow - if can bandage one bandage from hand to upper arm for week                      OT Education - 03/23/17 1941    Education provided Yes   Education Details compression garment and bandage at night time    Person(s) Educated Patient   Methods Explanation;Demonstration;Tactile cues;Verbal cues   Comprehension Verbal cues required;Returned demonstration;Verbalized understanding          OT Short Term Goals - 02/21/17 1413  OT SHORT TERM GOAL #1   Title Pt R shoulder AROM improve with at least 30 degrees  for pt to do hair, pull shirt over head and reach over head in cabinet   Baseline improve greatly  still trouble pulling off shirt over head   Time 3   Period Weeks   Status On-going     OT SHORT TERM GOAL #2   Title L shoulder AROM improve to WNL to reach over head , use in bathing and dressing without increase symptoms    Baseline great progress - still impaired in last range of motion   Time 3   Period Weeks   Status On-going     OT SHORT TERM GOAL #3   Title Pt ed on self MLD to do at home to decrease lymphedema circumference in R UE by 1 cm    Status Achieved           OT Long Term Goals - 02/21/17 1414      OT LONG TERM GOAL #1   Title Pt to be ind in Homeprogram for compression and MLD to decrease and maintain lymphedema in R UE by 1-2 cm    Baseline fitted with compression sleeve - still monitor , MLD , and taping - radiation starting tomorrow   Time 3   Period Weeks    Status On-going     OT LONG TERM GOAL #2   Title Bilateral UE AROM improve to WNL to do be able to get into radiation position , and return to prior level of function in daily activities    Baseline Can get in radiation position - but not back to prior level    Time 3   Period Weeks   Status On-going     OT LONG TERM GOAL #3   Title assess Quickdash    Baseline to be done next session   Time 1   Period Weeks   Status On-going               Plan - 03/23/17 1945    Clinical Impression Statement Pt's R UE measurements staying about the same again this week - she is 4 wks into radiation - still increase the most at distal forearm - but pt wearing compression garments during day and if she can - bandage at night    Rehab Potential Good   OT Frequency Biweekly   OT Duration 2 weeks   OT Treatment/Interventions Self-care/ADL training;Manual lymph drainage;Compression bandaging;Therapeutic exercises;Scar mobilization;Passive range of motion;Manual Therapy   Plan Pt will phone in 2 wks for follow up   OT Home Exercise Plan see pt instruction   Consulted and Agree with Plan of Care Patient      Patient will benefit from skilled therapeutic intervention in order to improve the following deficits and impairments:  Decreased range of motion, Impaired flexibility, Increased edema, Decreased scar mobility, Impaired UE functional use, Pain, Decreased strength, Decreased knowledge of precautions, Decreased knowledge of use of DME, Decreased skin integrity  Visit Diagnosis: Postmastectomy lymphedema syndrome    Problem List There are no active problems to display for this patient.   Rosalyn Gess OTR/L,CLT 03/23/2017, 7:47 PM  [facility] [address], [place], [address] Phone: [phone]   Fax:  [phone]  Name: Sue Wong MRN: [id number] Date of Birth: 22-Jun-1966

## 2017-04-26 ENCOUNTER — Ambulatory Visit: Payer: 59 | Attending: Nurse Practitioner | Admitting: Occupational Therapy

## 2017-04-26 DIAGNOSIS — M6281 Muscle weakness (generalized): Secondary | ICD-10-CM | POA: Diagnosis present

## 2017-04-26 DIAGNOSIS — M79602 Pain in left arm: Secondary | ICD-10-CM | POA: Insufficient documentation

## 2017-04-26 DIAGNOSIS — I972 Postmastectomy lymphedema syndrome: Secondary | ICD-10-CM | POA: Diagnosis not present

## 2017-04-26 DIAGNOSIS — M79601 Pain in right arm: Secondary | ICD-10-CM | POA: Diagnosis present

## 2017-04-26 DIAGNOSIS — M25612 Stiffness of left shoulder, not elsewhere classified: Secondary | ICD-10-CM | POA: Insufficient documentation

## 2017-04-26 DIAGNOSIS — M25611 Stiffness of right shoulder, not elsewhere classified: Secondary | ICD-10-CM | POA: Insufficient documentation

## 2017-04-26 NOTE — Therapy (Signed)
[facility] [address], [place], [address] Phone: [phone]   Fax:  [phone]  Occupational Therapy Treatment  Patient Details  Name: Sue Wong MRN: [id number] Date of Birth: 05-29-1966 Referring Provider: Freada Bergeron  Encounter Date: 04/26/2017      OT End of Session - 04/26/17 1709    Visit Number 15   Number of Visits 20   Date for OT Re-Evaluation 06/07/17   OT Start Time 1300   OT Stop Time 1342   OT Time Calculation (min) 42 min   Activity Tolerance Patient tolerated treatment well   Behavior During Therapy [facility] for tasks assessed/performed      No past medical history on file.  Past Surgical History:  Procedure Laterality Date  . BREAST SURGERY Bilateral 1/9 and 12/27/16    There were no vitals filed for this visit.      Subjective Assessment - 04/26/17 1705    Subjective  Radiation was finish 25th last month - did not do any wrapping my arm the last 2 wks - done it during  radiation - did not do yet taping or massage - my arm did get bigger it felt during radiation - I had bad blisters towards the end - I am on  tamicifen - gaing wait    Patient Stated Goals I do not want a large arm  - and want to be able to raise my arms above my head - need to do radiation soon and be able to hold my arm in position    Currently in Pain? No/denies             LYMPHEDEMA/ONCOLOGY QUESTIONNAIRE - 04/26/17 1308      Right Upper Extremity Lymphedema   15 cm Proximal to Olecranon Process 33.8 cm   10 cm Proximal to Olecranon Process 32.3 cm   Olecranon Process 28.8 cm   15 cm Proximal to Ulnar Styloid Process 28.2 cm   10 cm Proximal to Ulnar Styloid Process 25.5 cm   Just Proximal to Ulnar Styloid Process 17.3 cm   Across Hand at [employer] 18.8 cm   At [place] of 2nd Digit 7 cm   At [facility] of Thumb 6.3 cm     Measure R UE circumference Compare to month ago and then compare to L  UE  See clinical impression  Skin assess - and lymphedema under axilla, upper arm , forearm and scapula   Pt to cont with daytime compression  Wrap at night time arm for 2wks  Taping done with achor at R cervical ln and 3 fingers in to upper arm to proximal to elbow Taping R upper back fingers and achor down to R AI ( below anastomosis) Cont with HEP for 2wks - and will reassess in 6wks out from last radiation treatment                    OT Education - 04/26/17 1708    Education provided Yes   Education Details homeprogram for next 2 wks -   Person(s) Educated Patient   Methods Explanation;Demonstration;Tactile cues   Comprehension Returned demonstration;Verbalized understanding;Verbal cues required          OT Short Term Goals - 04/26/17 1716      OT SHORT TERM GOAL #1   Title Pt R shoulder AROM improve with at least 30 degrees  for pt to do hair, pull shirt over head  and reach over head in cabinet   Status Achieved     OT [place] #2   Title L shoulder AROM improve to WNL to reach over head , use in bathing and dressing without increase symptoms    Status Achieved     OT SHORT TERM GOAL #3   Title Pt ed on self MLD to do at home to decrease lymphedema circumference in R UE by 1 cm    Baseline did not do it during radiation    Time 4   Period Weeks   Status On-going           OT Long Term Goals - 04/26/17 1717      OT LONG TERM GOAL #1   Title Pt to be ind in Homeprogram for compression and MLD to decrease and maintain lymphedema in R UE by 1-2 cm    Baseline pt wear compression daytime-  wrap some at night time - finished radiation - need HEP change as needed    Time 6   Period Weeks   Status On-going     OT LONG TERM GOAL #2   Title Bilateral UE AROM improve to WNL to do be able to get into radiation position , and return to prior level of function in daily activities    Status Achieved     OT LONG TERM GOAL #3   Title assess  Quickdash    Baseline to be done next session   Time 2   Period Weeks   Status On-going               Plan - 04/26/17 1710    Clinical Impression Statement Pt's R UE circumference increase in forearm to upper arm since last seen about month ago and finishing radiation - compare to L UE pt increase 1.2 in hand, wrist same , forearm 2.5; elbow 1.2 cm , and upper arm 1.9 ; pt is R hand dominant  - pt to wrap at night time for 2wks and tape  - see homeprogram - she will be 6wks out from radiation  in 2wks -will reassess at that time    Rehab Potential Good   OT Frequency Biweekly   OT Duration 6 weeks   OT Treatment/Interventions Self-care/ADL training;Manual lymph drainage;Compression bandaging;Therapeutic exercises;Scar mobilization;Passive range of motion;Manual Therapy   Plan will reassess in 2wks    OT Home Exercise Plan see pt instruction   Consulted and Agree with Plan of Care Patient      Patient will benefit from skilled therapeutic intervention in order to improve the following deficits and impairments:  Decreased range of motion, Impaired flexibility, Increased edema, Decreased scar mobility, Impaired UE functional use, Pain, Decreased strength, Decreased knowledge of precautions, Decreased knowledge of use of DME, Decreased skin integrity  Visit Diagnosis: Postmastectomy lymphedema syndrome  Stiffness of left shoulder, not elsewhere classified  Muscle weakness (generalized)  Pain in left arm  Stiffness of right shoulder, not elsewhere classified  Pain in right arm    Problem List There are no active problems to display for this patient.   Rosalyn Gess OTR/L,CLT 04/26/2017, 5:19 PM  [place] [facility] [address], [place], [address] Phone: [phone]   Fax:  [phone]  Name: Sue Wong MRN: [id number] Date of Birth: [birth year]

## 2017-04-26 NOTE — Patient Instructions (Signed)
Pt to cont with daytime compression  Wrap at night time arm for 2wks  Taping done with achor at R cervical ln and 3 fingers in to upper arm to proximal to elbow Taping R upper back fingers and achor down to R AI ( below anastomosis)

## 2017-05-11 ENCOUNTER — Ambulatory Visit: Payer: 59 | Attending: Nurse Practitioner | Admitting: Occupational Therapy

## 2017-05-11 DIAGNOSIS — M6281 Muscle weakness (generalized): Secondary | ICD-10-CM | POA: Diagnosis present

## 2017-05-11 DIAGNOSIS — I972 Postmastectomy lymphedema syndrome: Secondary | ICD-10-CM

## 2017-05-11 DIAGNOSIS — M79601 Pain in right arm: Secondary | ICD-10-CM | POA: Diagnosis present

## 2017-05-11 DIAGNOSIS — M25611 Stiffness of right shoulder, not elsewhere classified: Secondary | ICD-10-CM | POA: Diagnosis present

## 2017-05-11 NOTE — Therapy (Signed)
[facility] [address] [place], [address] Phone: [phone]   Fax:  [phone]  Occupational Therapy Treatment  Patient Details  Name: Sue Wong MRN: [id number] Date of Birth: 1966/04/28 Referring Provider: Freada Bergeron  Encounter Date: 05/11/2017      OT End of Session - 05/11/17 2107    Visit Number 16   Number of Visits 20   Date for OT Re-Evaluation 06/07/17   OT Start Time 1500   OT Stop Time 1541   OT Time Calculation (min) 41 min   Activity Tolerance Patient tolerated treatment well   Behavior During Therapy [facility] for tasks assessed/performed      No past medical history on file.  Past Surgical History:  Procedure Laterality Date  . BREAST SURGERY Bilateral 1/9 and 12/27/16    There were no vitals filed for this visit.      Subjective Assessment - 05/11/17 2102    Subjective  I have so bad hot flashes - my house stays on 60 degrees - did not wrap my arm - could not do the taping on my back side - but did keep my sleeve on for the whole day until bed time - I am now about 7 wks our from  radiation    Patient Stated Goals I do not want a large arm  - and want to be able to raise my arms above my head - need to do radiation soon and be able to hold my arm in position    Currently in Pain? No/denies             LYMPHEDEMA/ONCOLOGY QUESTIONNAIRE - 05/11/17 1507      Right Upper Extremity Lymphedema   15 cm Proximal to Olecranon Process 33.8 cm   10 cm Proximal to Olecranon Process 33 cm   Olecranon Process 29 cm   15 cm Proximal to Ulnar Styloid Process 28.2 cm   10 cm Proximal to Ulnar Styloid Process 25.2 cm   Just Proximal to Ulnar Styloid Process 17.5 cm   Across Hand at [employer] 19 cm   At [place] of 2nd Digit 6.8 cm   At [facility] of Thumb 6 cm      Measure R UE circumference Compare to 2wks ago and then compare to L UE  Skin assess - and lymphedema under axilla,  upper arm , forearm and scapula Pt now more than 6 wks out from radiation    Pt to cont with daytime compression  Pt can now do MLD - pt has hand out and feel able to do at home  Discuss jovipak for thorasic lymphedema to use at night time   can do 2 types - serratus anterior or unilateral mastectomy - can order online or [place]  Taping done and pt to do at home   kinesiotape to upper quadrant - upper back and chest - to cervical ln for anchors As well as taping from R Upper anterior chest across to L chest  For anchor                      OT Education - 05/11/17 2107    Education provided Yes   Education Details homeprogram   Person(s) Educated Patient   Methods Explanation;Demonstration;Tactile cues;Verbal cues   Comprehension Verbal cues required;Returned demonstration;Verbalized understanding          OT Short Term Goals - 04/26/17 1716  OT SHORT TERM GOAL #1   Title Pt R shoulder AROM improve with at least 30 degrees  for pt to do hair, pull shirt over head and reach over head in cabinet   Status Achieved     OT SHORT TERM GOAL #2   Title L shoulder AROM improve to WNL to reach over head , use in bathing and dressing without increase symptoms    Status Achieved     OT SHORT TERM GOAL #3   Title Pt ed on self MLD to do at home to decrease lymphedema circumference in R UE by 1 cm    Baseline did not do it during radiation    Time 4   Period Weeks   Status On-going           OT Long Term Goals - 04/26/17 1717      OT LONG TERM GOAL #1   Title Pt to be ind in Homeprogram for compression and MLD to decrease and maintain lymphedema in R UE by 1-2 cm    Baseline pt wear compression daytime-  wrap some at night time - finished radiation - need HEP change as needed    Time 6   Period Weeks   Status On-going     OT LONG TERM GOAL #2   Title Bilateral UE AROM improve to WNL to do be able to get into radiation position , and return to prior level  of function in daily activities    Status Achieved     OT LONG TERM GOAL #3   Title assess Quickdash    Baseline to be done next session   Time 2   Period Weeks   Status On-going               Plan - 05/11/17 2110    Clinical Impression Statement Pt R UE circumference decrease in forearm and upper arm from elbow stayed about the same except mid upper arm- taping done for pt this date because of being out more than 6 wks from radiation - pt can also start self MLD -  recommend jovipak for compression of thorasic at night time -  willl reassess in 2 wks    Occupational performance deficits (Please refer to evaluation for details): ADL's;IADL's;Leisure;Play;Work   Rehab Potential Good   OT Frequency Biweekly   OT Duration 4 weeks   OT Treatment/Interventions Self-care/ADL training;Manual lymph drainage;Compression bandaging;Therapeutic exercises;Scar mobilization;Passive range of motion;Manual Therapy   Plan reassess in 2 wks using taping, jovipak and MLD    OT Home Exercise Plan see pt instruction   Consulted and Agree with Plan of Care Patient      Patient will benefit from skilled therapeutic intervention in order to improve the following deficits and impairments:  Decreased range of motion, Impaired flexibility, Increased edema, Decreased scar mobility, Impaired UE functional use, Pain, Decreased strength, Decreased knowledge of precautions, Decreased knowledge of use of DME, Decreased skin integrity  Visit Diagnosis: Postmastectomy lymphedema syndrome  Muscle weakness (generalized)    Problem List There are no active problems to display for this patient.   Rosalyn Gess OTR/L,CLT 05/11/2017, 9:13 PM  [place] [facility] [address], [place], [address] Phone: [phone]   Fax:  [phone]  Name: Sue Wong MRN: [id number] Date of Birth: 02-25-1966

## 2017-05-11 NOTE — Patient Instructions (Signed)
Pt do kinesiotape to upper quadrant - upper back and chest - to cervical ln for anchor  As well as taping from R Upper anterior chest across to L chest   Cont with daytime compression sleeve and gauntlet  And order Jovipak compressoin piece for breast  Can start MLD

## 2017-06-01 ENCOUNTER — Ambulatory Visit: Payer: 59 | Admitting: Occupational Therapy

## 2017-06-01 DIAGNOSIS — I972 Postmastectomy lymphedema syndrome: Secondary | ICD-10-CM

## 2017-06-01 DIAGNOSIS — M25611 Stiffness of right shoulder, not elsewhere classified: Secondary | ICD-10-CM

## 2017-06-01 DIAGNOSIS — M6281 Muscle weakness (generalized): Secondary | ICD-10-CM

## 2017-06-01 DIAGNOSIS — M79601 Pain in right arm: Secondary | ICD-10-CM

## 2017-06-01 NOTE — Patient Instructions (Signed)
Pt to cont with same HEP for MLD, taping ,bandaging , jovipak breast pad and daytime compression  Will send info for flexitouch - to initiate getting her pump

## 2017-06-01 NOTE — Therapy (Signed)
[facility] [address] [place], [address] Phone: [phone]   Fax:  [phone]  Occupational Therapy Treatment  Patient Details  Name: Sue Wong MRN: [id number] Date of Birth: 11-23-1966 Referring Provider: Freada Bergeron  Encounter Date: 06/01/2017      OT End of Session - 06/01/17 1839    Visit Number 17   Number of Visits 20   Date for OT Re-Evaluation 06/07/17   OT Start Time 1545   OT Stop Time 1614   OT Time Calculation (min) 29 min   Activity Tolerance Patient tolerated treatment well   Behavior During Therapy [facility] for tasks assessed/performed      No past medical history on file.  Past Surgical History:  Procedure Laterality Date  . BREAST SURGERY Bilateral 1/9 and 12/27/16    There were no vitals filed for this visit.      Subjective Assessment - 06/01/17 1828    Subjective  Doing okay - but I am doing compression sleeve and glove the whole day , bandaging at night time - taping for my chest, back and neck - and still feel my chest area is swelling where  I had the radiation , my forearm and huritng more ( with making braces at work ) - and has some days long hours at work) and my arm feel tightter or fuller - but measurements do not show that -    Patient Stated Goals I do not want a large arm  - and want to be able to raise my arms above my head - need to do radiation soon and be able to hold my arm in position    Currently in Pain? Yes   Pain Score 1    Pain Location Arm   Pain Orientation Right             LYMPHEDEMA/ONCOLOGY QUESTIONNAIRE - 06/01/17 1554      Right Upper Extremity Lymphedema   15 cm Proximal to Olecranon Process 33.8 cm   10 cm Proximal to Olecranon Process 33 cm   Olecranon Process 28.5 cm   15 cm Proximal to Ulnar Styloid Process 28 cm   10 cm Proximal to Ulnar Styloid Process 25.4 cm   Just Proximal to Ulnar Styloid Process 17.5 cm   Across  Hand at [employer] 19 cm   At [place] of 2nd Digit 6.9 cm   At [facility] of Thumb 6 cm     assess pt circumference in R UE and compare to L  Since Radiation ended - pt measurements increased   But last 2 times the same - but increase tightness ,fullness and pain in forearm  Harder time making the braces at work  Pt are wearing jovipak breast pad at night time , doing taping for thoracic  Lymphedema and MLD  Wearing compression bra Wearing every day compression sleeve and glove  And bandaging at night time   Would recommend for pt to be assess if flexitouch pump can help her with L upper quadrant lymphedema  Pt do have expanders in  Pt still need to go thru reconstruction surgery in Sept                     OT Education - 06/01/17 1838    Education provided Yes   Education Details findings of reassessment - and homeprogram to keep lymphedema under control   Person(s) Educated Patient  Methods Explanation;Tactile cues;Verbal cues;Demonstration   Comprehension Verbalized understanding;Returned demonstration;Verbal cues required          OT Short Term Goals - 04/26/17 1716      OT SHORT TERM GOAL #1   Title Pt R shoulder AROM improve with at least 30 degrees  for pt to do hair, pull shirt over head and reach over head in cabinet   Status Achieved     OT SHORT TERM GOAL #2   Title L shoulder AROM improve to WNL to reach over head , use in bathing and dressing without increase symptoms    Status Achieved     OT SHORT TERM GOAL #3   Title Pt ed on self MLD to do at home to decrease lymphedema circumference in R UE by 1 cm    Baseline did not do it during radiation    Time 4   Period Weeks   Status On-going           OT Long Term Goals - 04/26/17 1717      OT LONG TERM GOAL #1   Title Pt to be ind in Homeprogram for compression and MLD to decrease and maintain lymphedema in R UE by 1-2 cm    Baseline pt wear compression daytime-  wrap some at night time -  finished radiation - need HEP change as needed    Time 6   Period Weeks   Status On-going     OT LONG TERM GOAL #2   Title Bilateral UE AROM improve to WNL to do be able to get into radiation position , and return to prior level of function in daily activities    Status Achieved     OT LONG TERM GOAL #3   Title assess Quickdash    Baseline to be done next session   Time 2   Period Weeks   Status On-going               Plan - 06/01/17 1839    Clinical Impression Statement Pt measurements about the same than last time - but since Radiation stopped she increased and staying increased by 2.6 in upper arm, elbow .9cm , forearm 2.4 and hand 1.4 cm - pt is doing daytime compression sleeve and glove, night time bandaging , jovipak breast pad , taping for trunkle lymphedema , self MLD - but feels  more fullness in forearm and pain in forearm - pt  having harder time making braces  - and work long hours - she works full time and are very active - pt also experiecing increase lymphedema in R chest and up to clavicle where she had radiation - would recommend at this time for pt flexitouch pump with trunkle vest to decrease her lymphedema , keeping it under control for her to be able to  perfrom her job , prevent infections  or flareups     Occupational performance deficits (Please refer to evaluation for details): ADL's;IADL's;Work;Play;Leisure   Rehab Potential Good   OT Frequency 1x / week   OT Duration 4 weeks   OT Treatment/Interventions Self-care/ADL training;Manual lymph drainage;Compression bandaging;Therapeutic exercises;Scar mobilization;Passive range of motion;Manual Therapy   Plan send note and email to flexitouch reps - and await demo date setup    Clinical Decision Making Several treatment options, min-mod task modification necessary   OT Home Exercise Plan see pt instruction   Consulted and Agree with Plan of Care Patient      Patient will benefit  from skilled therapeutic  intervention in order to improve the following deficits and impairments:  Decreased range of motion, Impaired flexibility, Increased edema, Decreased scar mobility, Impaired UE functional use, Pain, Decreased strength, Decreased knowledge of precautions, Decreased knowledge of use of DME, Decreased skin integrity  Visit Diagnosis: Postmastectomy lymphedema syndrome  Muscle weakness (generalized)  Stiffness of right shoulder, not elsewhere classified  Pain in right arm    Problem List There are no active problems to display for this patient.   Rosalyn Gess OTR/L,CLT 06/01/2017, 6:45 PM  [facility] [address] [place], [address] Phone: [phone]   Fax:  [phone]  Name: HAILLEE JOHANN MRN: [id number] Date of Birth: 02-19-1966

## 2017-10-05 ENCOUNTER — Ambulatory Visit: Payer: BLUE CROSS/BLUE SHIELD | Attending: Nurse Practitioner | Admitting: Occupational Therapy

## 2017-10-05 DIAGNOSIS — M25612 Stiffness of left shoulder, not elsewhere classified: Secondary | ICD-10-CM | POA: Diagnosis present

## 2017-10-05 DIAGNOSIS — I972 Postmastectomy lymphedema syndrome: Secondary | ICD-10-CM | POA: Insufficient documentation

## 2017-10-05 DIAGNOSIS — M25611 Stiffness of right shoulder, not elsewhere classified: Secondary | ICD-10-CM | POA: Diagnosis present

## 2017-10-05 DIAGNOSIS — M79602 Pain in left arm: Secondary | ICD-10-CM | POA: Diagnosis present

## 2017-10-05 DIAGNOSIS — M6281 Muscle weakness (generalized): Secondary | ICD-10-CM

## 2017-10-05 DIAGNOSIS — M79601 Pain in right arm: Secondary | ICD-10-CM | POA: Diagnosis present

## 2017-10-05 NOTE — Therapy (Signed)
[facility] [address], [place], [address] Phone: [phone]   Fax:  [phone]  Occupational Therapy Treatment  Patient Details  Name: GETHSEMANE FISCHLER MRN: [id number] Date of Birth: 1966-07-26 Referring Provider: Freada Bergeron  Encounter Date: 10/05/2017      OT End of Session - 10/05/17 1702    Visit Number 18   Number of Visits 20   Date for OT Re-Evaluation 11/02/17   OT Start Time 1535   OT Stop Time 1616   OT Time Calculation (min) 41 min   Activity Tolerance Patient tolerated treatment well   Behavior During Therapy [facility] for tasks assessed/performed      No past medical history on file.  Past Surgical History:  Procedure Laterality Date  . BREAST SURGERY Bilateral 1/9 and 12/27/16    There were no vitals filed for this visit.      Subjective Assessment - 10/05/17 1700    Subjective  I have appt with my MD tomorrow about my plastic surgery in the future and what surgeon to see - I am not on Tamoxifan - on another one now - was doing pump but only 3 x week the last 2 wks - to hot to wear night sleeve and was on vacation about 2 months ago and lost my jovipak night time breast pad and still using my daytime comrpession and and gauntlet 80% and taping    Patient Stated Goals I do not want a large arm  - and want to be able to raise my arms above my head - need to do radiation soon and be able to hold my arm in position    Currently in Pain? No/denies             LYMPHEDEMA/ONCOLOGY QUESTIONNAIRE - 10/05/17 1543      Right Upper Extremity Lymphedema   15 cm Proximal to Olecranon Process 34.8 cm   10 cm Proximal to Olecranon Process 34 cm   Olecranon Process 29.5 cm   15 cm Proximal to Ulnar Styloid Process 28.5 cm   10 cm Proximal to Ulnar Styloid Process 25.5 cm   Just Proximal to Ulnar Styloid Process 18 cm   Across Hand at [employer] 19 cm   At [place] of 2nd Digit 6.5 cm   At [facility] of Thumb 6.1 cm       assess circumference on R and L - compare to start of care and  Last visit iincrease more than 1 cm at R  L since Febr did increase - weight changes ? - but less than .5 cm  Assess pt daytime compression - juzo tan still good  But color full ones - stretch out - she wear it more and wash more  To replace Get new jovipak breast pad -  Pump to use one time a day  And left message with flexitouch rep about if can use 2 x day over weekend and or repeat cycle  Cont with taping where needed                      OT Education - 10/05/17 1701    Education provided Yes   Education Details wearing of compression , getting new jovipak breast pad , pump use -left message on use with REp - and cont with taping    Person(s) Educated Patient   Methods Explanation;Demonstration;Tactile cues;Verbal cues   Comprehension Verbal cues required;Returned  demonstration;Verbalized understanding          OT Short Term Goals - 10/05/17 1714      OT SHORT TERM GOAL #1   Title Pt R shoulder AROM improve with at least 30 degrees  for pt to do hair, pull shirt over head and reach over head in cabinet   Status Achieved     OT SHORT TERM GOAL #2   Title L shoulder AROM improve to WNL to reach over head , use in bathing and dressing without increase symptoms    Status Achieved           OT Long Term Goals - 10/05/17 1714      OT LONG TERM GOAL #1   Title Pt to be ind in Homeprogram for compression and MLD to decrease and maintain lymphedema in R UE by 1-2 cm    Baseline did increase since last seen - modify HEP again -and await REp from flexitouch input and pt to get new jovipak breast pad and new compression day sleeve - cont taping    Time 4   Period Weeks   Status On-going     OT LONG TERM GOAL #2   Title Bilateral UE AROM improve to WNL to do be able to get into radiation position , and return to prior level of function in daily activities    Status  Achieved               Plan - 10/05/17 1704    Clinical Impression Statement Pt was seen this date to reassess after using flexitouch pump for about 3 months - pt increase with 1 cm from elbow to upper arm , .5 cm at proximal forearm -  pt report she lost jovipak  breast pad on vacation about 2 month ago - daytime garment stretch out and she  wash it a lot - need to replace it -  do like the taping  and it helps decongest during day - left message with Rep about if she can  repeat some of chambers  or do extra session with pump over weekend or some days if she had harder day at work using her hands  - will reassess her in 2 wks again    Occupational performance deficits (Please refer to evaluation for details): ADL's;IADL's;Work;Leisure   Rehab Potential Good   OT Frequency 1x / week   OT Duration 4 weeks   OT Treatment/Interventions Self-care/ADL training;Manual lymph drainage;Compression bandaging;Therapeutic exercises;Scar mobilization;Passive range of motion;Manual Therapy   Plan reassess measurement with after adapting    Clinical Decision Making Limited treatment options, no task modification necessary   OT Home Exercise Plan see pt instruction   Consulted and Agree with Plan of Care Patient      Patient will benefit from skilled therapeutic intervention in order to improve the following deficits and impairments:  Decreased range of motion, Impaired flexibility, Increased edema, Decreased scar mobility, Impaired UE functional use, Pain, Decreased strength, Decreased knowledge of precautions, Decreased knowledge of use of DME, Decreased skin integrity  Visit Diagnosis: Postmastectomy lymphedema syndrome - Plan: Ot plan of care cert/re-cert  Muscle weakness (generalized) - Plan: Ot plan of care cert/re-cert  Stiffness of right shoulder, not elsewhere classified - Plan: Ot plan of care cert/re-cert  Pain in right arm - Plan: Ot plan of care cert/re-cert  Stiffness of left  shoulder, not elsewhere classified - Plan: Ot plan of care cert/re-cert  Pain in left arm - Plan:  Ot plan of care cert/re-cert    Problem List There are no active problems to display for this patient.   Rosalyn Gess OTR/L,CLT 10/05/2017, 5:17 PM  [facility] [address] [place], [address] Phone: [phone]   Fax:  [phone]  Name: TAKEYA MARQUIS MRN: [id number] Date of Birth: 11-25-1966

## 2018-06-08 ENCOUNTER — Other Ambulatory Visit: Payer: Self-pay | Admitting: Internal Medicine

## 2018-06-08 DIAGNOSIS — R3129 Other microscopic hematuria: Secondary | ICD-10-CM

## 2018-06-21 ENCOUNTER — Ambulatory Visit
Admission: RE | Admit: 2018-06-21 | Discharge: 2018-06-21 | Disposition: A | Discharge status: Home / Self Care | Payer: BLUE CROSS/BLUE SHIELD | Source: Ambulatory Visit | Attending: Internal Medicine | Admitting: Internal Medicine

## 2018-06-21 DIAGNOSIS — M793 Panniculitis, unspecified: Secondary | ICD-10-CM | POA: Insufficient documentation

## 2018-06-21 DIAGNOSIS — R3129 Other microscopic hematuria: Secondary | ICD-10-CM | POA: Insufficient documentation

## 2018-06-21 DIAGNOSIS — Z9013 Acquired absence of bilateral breasts and nipples: Secondary | ICD-10-CM | POA: Insufficient documentation

## 2018-06-21 DIAGNOSIS — Z9882 Breast implant status: Secondary | ICD-10-CM | POA: Insufficient documentation

## 2018-06-21 DIAGNOSIS — N200 Calculus of kidney: Secondary | ICD-10-CM | POA: Diagnosis not present

## 2020-05-05 ENCOUNTER — Ambulatory Visit
Admission: RE | Admit: 2020-05-05 | Discharge: 2020-05-05 | Disposition: A | Discharge status: Home / Self Care | Payer: Managed Care, Other (non HMO) | Source: Ambulatory Visit | Attending: Physician Assistant | Admitting: Physician Assistant

## 2020-05-05 ENCOUNTER — Other Ambulatory Visit (HOSPITAL_COMMUNITY): Payer: Self-pay | Admitting: Physician Assistant

## 2020-05-05 ENCOUNTER — Other Ambulatory Visit: Payer: Self-pay | Admitting: Physician Assistant

## 2020-05-05 DIAGNOSIS — L03113 Cellulitis of right upper limb: Secondary | ICD-10-CM | POA: Insufficient documentation

## 2020-06-04 ENCOUNTER — Inpatient Hospital Stay
Admission: EM | Admit: 2020-06-04 | Discharge: 2020-06-06 | DRG: 603 | Disposition: A | Discharge status: Home / Self Care | Payer: Managed Care, Other (non HMO) | Attending: Internal Medicine | Admitting: Internal Medicine

## 2020-06-04 ENCOUNTER — Inpatient Hospital Stay: Payer: Managed Care, Other (non HMO)

## 2020-06-04 ENCOUNTER — Encounter: Payer: Self-pay | Admitting: Emergency Medicine

## 2020-06-04 ENCOUNTER — Other Ambulatory Visit: Payer: Self-pay

## 2020-06-04 DIAGNOSIS — Z885 Allergy status to narcotic agent status: Secondary | ICD-10-CM

## 2020-06-04 DIAGNOSIS — Z9013 Acquired absence of bilateral breasts and nipples: Secondary | ICD-10-CM | POA: Diagnosis not present

## 2020-06-04 DIAGNOSIS — Z8042 Family history of malignant neoplasm of prostate: Secondary | ICD-10-CM

## 2020-06-04 DIAGNOSIS — Z8249 Family history of ischemic heart disease and other diseases of the circulatory system: Secondary | ICD-10-CM

## 2020-06-04 DIAGNOSIS — Z853 Personal history of malignant neoplasm of breast: Secondary | ICD-10-CM | POA: Diagnosis not present

## 2020-06-04 DIAGNOSIS — R609 Edema, unspecified: Secondary | ICD-10-CM

## 2020-06-04 DIAGNOSIS — L409 Psoriasis, unspecified: Secondary | ICD-10-CM | POA: Diagnosis present

## 2020-06-04 DIAGNOSIS — L0390 Cellulitis, unspecified: Secondary | ICD-10-CM | POA: Diagnosis present

## 2020-06-04 DIAGNOSIS — I890 Lymphedema, not elsewhere classified: Secondary | ICD-10-CM

## 2020-06-04 DIAGNOSIS — Z7951 Long term (current) use of inhaled steroids: Secondary | ICD-10-CM

## 2020-06-04 DIAGNOSIS — R05 Cough: Secondary | ICD-10-CM | POA: Diagnosis present

## 2020-06-04 DIAGNOSIS — Z9221 Personal history of antineoplastic chemotherapy: Secondary | ICD-10-CM | POA: Diagnosis not present

## 2020-06-04 DIAGNOSIS — K219 Gastro-esophageal reflux disease without esophagitis: Secondary | ICD-10-CM | POA: Diagnosis present

## 2020-06-04 DIAGNOSIS — Z79899 Other long term (current) drug therapy: Secondary | ICD-10-CM

## 2020-06-04 DIAGNOSIS — Z888 Allergy status to other drugs, medicaments and biological substances status: Secondary | ICD-10-CM

## 2020-06-04 DIAGNOSIS — Z803 Family history of malignant neoplasm of breast: Secondary | ICD-10-CM | POA: Diagnosis not present

## 2020-06-04 DIAGNOSIS — Z8619 Personal history of other infectious and parasitic diseases: Secondary | ICD-10-CM

## 2020-06-04 DIAGNOSIS — Z833 Family history of diabetes mellitus: Secondary | ICD-10-CM | POA: Diagnosis not present

## 2020-06-04 DIAGNOSIS — R509 Fever, unspecified: Secondary | ICD-10-CM | POA: Diagnosis not present

## 2020-06-04 DIAGNOSIS — Z79811 Long term (current) use of aromatase inhibitors: Secondary | ICD-10-CM | POA: Diagnosis not present

## 2020-06-04 DIAGNOSIS — Z20822 Contact with and (suspected) exposure to covid-19: Secondary | ICD-10-CM | POA: Diagnosis present

## 2020-06-04 DIAGNOSIS — E876 Hypokalemia: Secondary | ICD-10-CM | POA: Diagnosis not present

## 2020-06-04 DIAGNOSIS — L03113 Cellulitis of right upper limb: Secondary | ICD-10-CM | POA: Diagnosis present

## 2020-06-04 HISTORY — DX: Psoriasis, unspecified: L40.9

## 2020-06-04 HISTORY — DX: Unspecified osteoarthritis, unspecified site: M19.90

## 2020-06-04 HISTORY — DX: Spotted fever due to Rickettsia rickettsii: A77.0

## 2020-06-04 HISTORY — DX: Gastro-esophageal reflux disease without esophagitis: K21.9

## 2020-06-04 HISTORY — DX: Malignant neoplasm of unspecified site of unspecified female breast: C50.919

## 2020-06-04 LAB — CBC WITH DIFFERENTIAL/PLATELET
Abs Immature Granulocytes: 0.04 10*3/uL (ref 0.00–0.07)
Basophils Absolute: 0 10*3/uL (ref 0.0–0.1)
Basophils Relative: 0 %
Eosinophils Absolute: 0.1 10*3/uL (ref 0.0–0.5)
Eosinophils Relative: 0 %
HCT: 38.3 % (ref 36.0–46.0)
Hemoglobin: 12.6 g/dL (ref 12.0–15.0)
Immature Granulocytes: 0 %
Lymphocytes Relative: 9 %
Lymphs Abs: 1.2 10*3/uL (ref 0.7–4.0)
MCH: 31.9 pg (ref 26.0–34.0)
MCHC: 32.9 g/dL (ref 30.0–36.0)
MCV: 97 fL (ref 80.0–100.0)
Monocytes Absolute: 0.6 10*3/uL (ref 0.1–1.0)
Monocytes Relative: 4 %
Neutro Abs: 12.1 10*3/uL — ABNORMAL HIGH (ref 1.7–7.7)
Neutrophils Relative %: 87 %
Platelets: 219 10*3/uL (ref 150–400)
RBC: 3.95 MIL/uL (ref 3.87–5.11)
RDW: 12.8 % (ref 11.5–15.5)
WBC: 14.1 10*3/uL — ABNORMAL HIGH (ref 4.0–10.5)
nRBC: 0 % (ref 0.0–0.2)

## 2020-06-04 LAB — COMPREHENSIVE METABOLIC PANEL
ALT: 33 U/L (ref 0–44)
AST: 27 U/L (ref 15–41)
Albumin: 4 g/dL (ref 3.5–5.0)
Alkaline Phosphatase: 72 U/L (ref 38–126)
Anion gap: 10 (ref 5–15)
BUN: 18 mg/dL (ref 6–20)
CO2: 27 mmol/L (ref 22–32)
Calcium: 9 mg/dL (ref 8.9–10.3)
Chloride: 98 mmol/L (ref 98–111)
Creatinine, Ser: 1.22 mg/dL — ABNORMAL HIGH (ref 0.44–1.00)
GFR calc Af Amer: 59 mL/min — ABNORMAL LOW (ref >60)
GFR calc non Af Amer: 51 mL/min — ABNORMAL LOW (ref >60)
Glucose, Bld: 110 mg/dL — ABNORMAL HIGH (ref 70–99)
Potassium: 3.6 mmol/L (ref 3.5–5.1)
Sodium: 135 mmol/L (ref 135–145)
Total Bilirubin: 0.9 mg/dL (ref 0.3–1.2)
Total Protein: 7.6 g/dL (ref 6.5–8.1)

## 2020-06-04 LAB — SARS CORONAVIRUS 2 BY RT PCR (HOSPITAL ORDER, PERFORMED IN ~~LOC~~ HOSPITAL LAB): SARS Coronavirus 2: NEGATIVE

## 2020-06-04 MED ORDER — SODIUM CHLORIDE 0.9 % IV SOLN
100.0000 mg | Freq: Once | INTRAVENOUS | Status: AC
  Administered 2020-06-04: 100 mg via INTRAVENOUS
  Filled 2020-06-04: qty 100

## 2020-06-04 NOTE — ED Provider Notes (Signed)
[facility] Emergency Department Provider Note  Time seen: 5:15 PM  I have reviewed the triage vital signs and the nursing notes.   HISTORY  Chief Complaint Cellulitis   HPI Sue Wong is a 54 y.o. female with a past medical history of breast cancer status post mastectomy in 2018 presents to the emergency department for fever arm redness and swelling.  According to the patient since her mastectomy she has been experiencing mild lymphedema on the right upper extremity.  States that in May she was bit by a tick on the right upper extremity.  Shortly afterward developed redness and swelling.  Patient was treated with doxycycline by her PCP.  Patient improved but states a week or 2 later developed fever once again and had a RMSF titer performed 05/05/2020 that resulted positive was placed on a second dose of doxycycline.  Patient states since finishing that dose of doxycycline her arm has been normal in size without redness until yesterday it began swelling and turning red once again.  Patient noted to have a fever greater than 102 last night.  Took Tylenol last night and again today.  Redness and swelling worsened today so the patient came to the emergency department for evaluation.  Denies any chest pain or shortness of breath.   History reviewed. No pertinent past medical history.  There are no problems to display for this patient.   Past Surgical History:  Procedure Laterality Date  . BREAST SURGERY Bilateral 1/9 and 12/27/16    Prior to Admission medications   Medication Sig Start Date End Date Taking? Authorizing Provider  codeine 15 MG tablet Take 15 mg by mouth every 6 (six) hours as needed.    [provider]  cyclobenzaprine (FLEXERIL) 5 MG tablet Take 5 mg by mouth 3 (three) times daily as needed for muscle spasms.    [provider]  lidocaine-prilocaine (EMLA) cream Apply 1 application topically as needed.    [provider]   mometasone-formoterol (DULERA) 100-5 MCG/ACT AERO Inhale 2 puffs into the lungs 2 (two) times daily.    [provider]  oxyCODONE-acetaminophen (PERCOCET) 10-325 MG tablet Take 1 tablet by mouth every 4 (four) hours as needed for pain.    [provider]    Allergies  Allergen Reactions  . Codeine Itching  . Oxycodone Itching    No family history on file.  Social History Social History   Tobacco Use  . Smoking status: Never Smoker  . Smokeless tobacco: Never Used  Vaping Use  . Vaping Use: Never used  Substance Use Topics  . Alcohol use: Never  . Drug use: Never    Review of Systems Constitutional: Negative for fever. Cardiovascular: Negative for chest pain. Respiratory: Negative for shortness of breath. Gastrointestinal: Negative for abdominal pain Genitourinary: Negative for urinary compaints Musculoskeletal: Swelling of right upper extremity Skin: Redness swelling of right upper extremity Neurological: Negative for headache All other ROS negative  ____________________________________________   PHYSICAL EXAM:  VITAL SIGNS: ED Triage Vitals  Enc Vitals Group     BP 06/04/20 1547 (!) 122/100     Pulse Rate 06/04/20 1547 97     Resp 06/04/20 1547 16     Temp 06/04/20 1547 97.7 F (36.5 C)     Temp Source 06/04/20 1547 Oral     SpO2 06/04/20 1547 97 %     Weight 06/04/20 1548 156 lb (70.8 kg)     Height 06/04/20 1548 5' (1.524 m)  Head Circumference --      Peak Flow --      Pain Score 06/04/20 1548 5     Pain Loc --      Pain Edu? --      Excl. in [place]? --    Constitutional: Alert and oriented. Well appearing and in no distress. Eyes: Normal exam ENT      Head: Normocephalic and atraumatic.      Mouth/Throat: Mucous membranes are moist. Cardiovascular: Normal rate, regular rhythm.  Respiratory: Normal respiratory effort without tachypnea nor retractions. Breath sounds are clear  Gastrointestinal: Soft and nontender. No  distention.   Musculoskeletal: Moderate edema to the right upper extremity with moderate erythema.  Warm to the touch. Neurologic:  Normal speech and language. No gross focal neurologic deficits Skin:  Skin is warm, dry.  Erythematous described above in the right upper extremity. Psychiatric: Mood and affect are normal.   ____________________________________________   INITIAL IMPRESSION / ASSESSMENT AND PLAN / ED COURSE  Pertinent labs & imaging results that were available during my care of the patient were reviewed by me and considered in my medical decision making (see chart for details).   Patient presents emergency department for right upper extremity erythema and swelling.  Patient with fever to 102 last night, white count of 14,000 today.  Although afebrile currently the patient states she took Tylenol prior to arrival.  Given the elevated white blood cell count and fever along with significant swelling and spreading erythema we will admit to the hospitalist service for IV antibiotics.  We will send blood cultures.  We will obtain a right upper extremity ultrasound as a precaution.  Given 2 rounds of doxycycline although patient did improve with each round, patient would likely benefit from infectious disease consultation as well.  We will start on IV doxycycline for now and admit to the hospitalist service.  Sue Wong was evaluated in Emergency Department on 06/04/2020 for the symptoms described in the history of present illness. She was evaluated in the context of the global COVID-19 pandemic, which necessitated consideration that the patient might be at risk for infection with the SARS-CoV-2 virus that causes COVID-19. Institutional protocols and algorithms that pertain to the evaluation of patients at risk for COVID-19 are in a state of rapid change based on information released by regulatory bodies including the CDC and federal and state organizations. These policies and algorithms  were followed during the patient's care in the ED.  ____________________________________________   FINAL CLINICAL IMPRESSION(S) / ED DIAGNOSES  Right upper extremity cellulitis   Harvest Dark, MD 06/04/20 2043

## 2020-06-04 NOTE — ED Triage Notes (Signed)
Pt in via POV, reports lymphedema at baseline to right arm from previous mastectomy.  Also reports tick bite w/ rocky mt spotted fever x one month ago, treated with 2 rounds of doxycycline; states redness, swelling, fever returned last night.  States some nausea as well.  Vitals WDL.  NAD noted at this time.

## 2020-06-04 NOTE — H&P (Addendum)
[facility] History and Physical  Sue Wong ZHY:[id number] DOB: [birth year] DOA: 06/04/2020  Referring physician: ED  PCP: Sue Hector, MD   Patient is coming from: Home  Chief Complaint: Right arm redness and swelling  HPI: Sue Wong is a 54 y.o. female with past medical history of metastatic breast cancer status post ectomy in 2018 with mild residual lymphedema and prior history of [place] spotted fever treated twice with doxycycline with good remission, presented to the hospital  with 1 day history of worsening redness, swelling edema of the right upper extremity.  Patient also had mild febrile illness associated with that and complained of mild headache.  She does have some postnasal drip as well.  Denies any sore throat or difficulty swallowing.  Denied vomiting but had mild nausea.  Patient denies current joint pain or swelling.    Patient does have history of [facility] spotted fever in May 2021 and was treated with 7-day course of doxycycline.  Subsequently she had a relapse in 2 weeks of treatment and underwent her second course of doxycycline with remission until yesterday when she noticed erythema ,edema and swelling of the right upper extremity.  Patient states that she has been lately gardening but denies any obvious exposure to chemicals, detergents/toxins.  Denies any shortness of breath ,chest pain but has mild cough from postnasal drip.  Denies any abdominal pain, vomiting or diarrhea.  Denies urinary urgency, frequency or dysuria.  Denies changes in her medications.  Patient reported fever of 102 F last night and took Tylenol yesterday and today.  ED Course: In the ED, patient was afebrile but had taken Tylenol prior to coming to the hospital.   WBC at 14.1.  Patient had significant redness swelling and spreading erythema of the right upper extremity so she was considered for admission to the hospital.  Review of Systems:  All systems were  reviewed and were negative unless otherwise mentioned in the HPI  Past Medical History:  Diagnosis Date  . Arthritis   . Breast cancer ([place])   . GERD (gastroesophageal reflux disease)   . Psoriasis   . [place] spotted fever    Past Surgical History:  Procedure Laterality Date  . BREAST SURGERY Bilateral 1/9 and 12/27/16    Social History:  reports that she has never smoked. She has never used smokeless tobacco. She reports that she does not drink alcohol and does not use drugs.  Allergies  Allergen Reactions  . Codeine Itching    Severe itching   . Oxycodone Itching    Severe itching    Family History  Problem Relation Age of Onset  . Hypertension Mother   . Diabetes Father   . Prostate cancer Father   . Breast cancer Sister   . Diabetes Sister      Prior to Admission medications   Medication Sig Start Date End Date Taking? Authorizing Provider  Calcium Carbonate-Vitamin D3 (CALCIUM 600-D) 600-400 MG-UNIT TABS Take 1 tablet by mouth daily.   Yes [provider]  letrozole (FEMARA) 2.5 MG tablet Take 2.5 mg by mouth every evening.   Yes [provider]  pantoprazole (PROTONIX) 40 MG tablet Take 40 mg by mouth daily as needed (acid reflux symptoms).   Yes [provider]  venlafaxine XR (EFFEXOR-XR) 75 MG 24 hr capsule Take 75 mg by mouth every evening.   Yes [provider]    Physical Exam: Vitals:   06/04/20 1547 06/04/20 1548  BP: (!) 122/100   Pulse: 97   Resp: 16   Temp: 97.7 F (36.5 C)   TempSrc: Oral   SpO2: 97%   Weight:  70.8 kg  Height:  5' (1.524 m)   Wt Readings from Last 3 Encounters:  06/04/20 70.8 kg   Body mass index is 30.47 kg/m.  General:  Average built, not in obvious distress HENT: Normocephalic, pupils equally reacting to light and accommodation.  No scleral pallor or icterus noted. Oral mucosa is moist.  Chest:  Clear breath sounds.  Diminished breath sounds bilaterally. No crackles or  wheezes.  CVS: S1 &S2 heard. No murmur.  Regular rate and rhythm. Abdomen: Soft, nontender, nondistended.  Bowel sounds are heard.  Liver is not palpable, no abdominal mass palpated Extremities: No cyanosis, clubbing or lower extremity edema.  Peripheral pulses are palpable.  Right upper extremity with baseline lymphedema and diffuse erythema which is blanching involving the hand including the palmar aspect, forearm arm and periaxillary region. Psych: Alert, awake and oriented, normal mood CNS:  No cranial nerve deficits.  Power equal in all extremities.   No cerebellar signs.   Skin: Warm and dry.  Erythematous rash noted with edema of the right upper extremity.        Labs on Admission:   CBC: Recent Labs  Lab 06/04/20 1553  WBC 14.1*  NEUTROABS 12.1*  HGB 12.6  HCT 38.3  MCV 97.0  PLT 277    Basic Metabolic Panel: Recent Labs  Lab 06/04/20 1553  NA 135  K 3.6  CL 98  CO2 27  GLUCOSE 110*  BUN 18  CREATININE 1.22*  CALCIUM 9.0    Liver Function Tests: Recent Labs  Lab 06/04/20 1553  AST 27  ALT 33  ALKPHOS 72  BILITOT 0.9  PROT 7.6  ALBUMIN 4.0   No results for input(s): LIPASE, AMYLASE in the last 168 hours. No results for input(s): AMMONIA in the last 168 hours.  Cardiac Enzymes: No results for input(s): CKTOTAL, CKMB, CKMBINDEX, TROPONINI in the last 168 hours.  BNP (last 3 results) No results for input(s): BNP in the last 8760 hours.  ProBNP (last 3 results) No results for input(s): PROBNP in the last 8760 hours.  CBG: No results for input(s): GLUCAP in the last 168 hours.  Lipase  No results found for: LIPASE   Urinalysis No results found for: COLORURINE, APPEARANCEUR, LABSPEC, PHURINE, GLUCOSEU, HGBUR, BILIRUBINUR, KETONESUR, PROTEINUR, UROBILINOGEN, NITRITE, LEUKOCYTESUR   Drugs of Abuse  No results found for: [place], [place], [place], [place], [place], [place]    Radiological Exams on Admission: No results found.  EKG: Not  available for review  Assessment/Plan Principal Problem:   Cellulitis Active Problems:   Fever   Lymphedema  Fever erythema edema of the right upper extremity suggestive of cellulitis.  She had febrile illness yesterday with mild leukocytosis today.  Patient does have history of [facility] spotted fever treated twice with doxycycline.  Patient received 1 dose of Dexilant in the ED we will continue with that for now.  Will get infectious disease consultation in a.m.  Elevation of the right upper extremity.  Follow blood cultures sent from the ED.  Will obtain venous duplex ultrasound of the right upper extremity.  History of breast cancer status postmastectomy in remission with residual lymphedema of the right upper extremity.  History of GERD.  Stable.  Nasal drip with mild cough.  Will consider Flonase spray.  History of psoriasis on the scalp.  Controlled.  DVT Prophylaxis: Lovenox subq  Consultant: None  Code Status: Full code  Microbiology blood culture sent from the ED  Antibiotics: IV doxycycline  Family Communication:  Patients' condition and plan of care including tests being ordered have been discussed with the patient who indicate understanding and agree with the plan.   Status is: Inpatient  Remains inpatient appropriate because:IV treatments appropriate due to intensity of illness or inability to take PO and Inpatient level of care appropriate due to severity of illness   Dispo: The patient is from: Home              Anticipated d/c is to: Home              Anticipated d/c date is: 2 days              Patient currently is not medically stable to d/c.   Severity of Illness: The appropriate patient status for this patient is INPATIENT. Inpatient status is judged to be reasonable and necessary in order to provide the required intensity of service to ensure the patient's safety. The patient's presenting symptoms, physical exam findings, and initial radiographic  and laboratory data in the context of their chronic comorbidities is felt to place them at high risk for further clinical deterioration. Furthermore, it is not anticipated that the patient will be medically stable for discharge from the hospital within 2 midnights of admission.  I certify that at the point of admission it is my clinical judgment that the patient will require inpatient hospital care spanning beyond 2 midnights from the point of admission due to high intensity of service, high risk for further deterioration and high frequency of surveillance required   Signed, Flora Lipps, MD [facility] 06/04/2020

## 2020-06-04 NOTE — ED Notes (Signed)
IV team at bedside 

## 2020-06-05 DIAGNOSIS — I890 Lymphedema, not elsewhere classified: Secondary | ICD-10-CM

## 2020-06-05 DIAGNOSIS — L03113 Cellulitis of right upper limb: Principal | ICD-10-CM

## 2020-06-05 LAB — COMPREHENSIVE METABOLIC PANEL
ALT: 28 U/L (ref 0–44)
AST: 24 U/L (ref 15–41)
Albumin: 3.3 g/dL — ABNORMAL LOW (ref 3.5–5.0)
Alkaline Phosphatase: 62 U/L (ref 38–126)
Anion gap: 9 (ref 5–15)
BUN: 13 mg/dL (ref 6–20)
CO2: 24 mmol/L (ref 22–32)
Calcium: 8.5 mg/dL — ABNORMAL LOW (ref 8.9–10.3)
Chloride: 104 mmol/L (ref 98–111)
Creatinine, Ser: 0.85 mg/dL (ref 0.44–1.00)
GFR calc Af Amer: >60 mL/min (ref >60)
GFR calc non Af Amer: >60 mL/min (ref >60)
Glucose, Bld: 104 mg/dL — ABNORMAL HIGH (ref 70–99)
Potassium: 3.4 mmol/L — ABNORMAL LOW (ref 3.5–5.1)
Sodium: 137 mmol/L (ref 135–145)
Total Bilirubin: 0.6 mg/dL (ref 0.3–1.2)
Total Protein: 6.3 g/dL — ABNORMAL LOW (ref 6.5–8.1)

## 2020-06-05 LAB — CBC
HCT: 34.2 % — ABNORMAL LOW (ref 36.0–46.0)
Hemoglobin: 11.3 g/dL — ABNORMAL LOW (ref 12.0–15.0)
MCH: 31.7 pg (ref 26.0–34.0)
MCHC: 33 g/dL (ref 30.0–36.0)
MCV: 95.8 fL (ref 80.0–100.0)
Platelets: 202 10*3/uL (ref 150–400)
RBC: 3.57 MIL/uL — ABNORMAL LOW (ref 3.87–5.11)
RDW: 12.7 % (ref 11.5–15.5)
WBC: 10.2 10*3/uL (ref 4.0–10.5)
nRBC: 0 % (ref 0.0–0.2)

## 2020-06-05 LAB — HIV ANTIBODY (ROUTINE TESTING W REFLEX): HIV Screen 4th Generation wRfx: NONREACTIVE

## 2020-06-05 MED ORDER — SODIUM CHLORIDE 0.9 % IV SOLN
100.0000 mg | Freq: Two times a day (BID) | INTRAVENOUS | Status: DC
  Administered 2020-06-05: 100 mg via INTRAVENOUS
  Filled 2020-06-05 (×3): qty 100

## 2020-06-05 MED ORDER — SODIUM CHLORIDE 0.9 % IV SOLN
250.0000 mL | INTRAVENOUS | Status: DC | PRN
  Administered 2020-06-05: 09:00:00 250 mL via INTRAVENOUS

## 2020-06-05 MED ORDER — SODIUM CHLORIDE 0.9% FLUSH: 3.0000 mL | INTRAVENOUS | Status: DC | PRN

## 2020-06-05 MED ORDER — ACETAMINOPHEN 325 MG PO TABS: 650.0000 mg | ORAL_TABLET | Freq: Four times a day (QID) | ORAL | Status: DC | PRN

## 2020-06-05 MED ORDER — IBUPROFEN 200 MG PO TABS
400.0000 mg | ORAL_TABLET | Freq: Four times a day (QID) | ORAL | Status: DC | PRN
  Administered 2020-06-05: 05:00:00 400 mg via ORAL
  Filled 2020-06-05 (×2): qty 2

## 2020-06-05 MED ORDER — DOCUSATE SODIUM 100 MG PO CAPS
100.0000 mg | ORAL_CAPSULE | Freq: Two times a day (BID) | ORAL | Status: DC
  Administered 2020-06-05 – 2020-06-06 (×3): 100 mg via ORAL
  Filled 2020-06-05 (×3): qty 1

## 2020-06-05 MED ORDER — CEFAZOLIN SODIUM-DEXTROSE 1-4 GM/50ML-% IV SOLN
1.0000 g | Freq: Three times a day (TID) | INTRAVENOUS | Status: DC
  Administered 2020-06-05: 11:00:00 1 g via INTRAVENOUS
  Filled 2020-06-05 (×5): qty 50

## 2020-06-05 MED ORDER — PANTOPRAZOLE SODIUM 40 MG PO TBEC: 40.0000 mg | DELAYED_RELEASE_TABLET | Freq: Every day | ORAL | Status: DC | PRN

## 2020-06-05 MED ORDER — ONDANSETRON HCL 4 MG PO TABS: 4.0000 mg | ORAL_TABLET | Freq: Four times a day (QID) | ORAL | Status: DC | PRN

## 2020-06-05 MED ORDER — ONDANSETRON HCL 4 MG/2ML IJ SOLN: 4.0000 mg | Freq: Four times a day (QID) | INTRAMUSCULAR | Status: DC | PRN

## 2020-06-05 MED ORDER — CALCIUM CARBONATE-VITAMIN D 500-200 MG-UNIT PO TABS
1.0000 | ORAL_TABLET | Freq: Every day | ORAL | Status: DC
  Administered 2020-06-05 – 2020-06-06 (×2): 1 via ORAL
  Filled 2020-06-05 (×2): qty 1

## 2020-06-05 MED ORDER — ACETAMINOPHEN 650 MG RE SUPP: 650.0000 mg | Freq: Four times a day (QID) | RECTAL | Status: DC | PRN

## 2020-06-05 MED ORDER — SODIUM CHLORIDE 0.9% FLUSH
3.0000 mL | Freq: Two times a day (BID) | INTRAVENOUS | Status: DC
  Administered 2020-06-05 – 2020-06-06 (×2): 3 mL via INTRAVENOUS

## 2020-06-05 MED ORDER — CEFAZOLIN SODIUM-DEXTROSE 2-4 GM/100ML-% IV SOLN
2.0000 g | Freq: Three times a day (TID) | INTRAVENOUS | Status: DC
  Administered 2020-06-05 – 2020-06-06 (×2): 2 g via INTRAVENOUS
  Filled 2020-06-05 (×4): qty 100

## 2020-06-05 MED ORDER — POLYETHYLENE GLYCOL 3350 17 G PO PACK: 17.0000 g | PACK | Freq: Every day | ORAL | Status: DC | PRN

## 2020-06-05 MED ORDER — ENOXAPARIN SODIUM 40 MG/0.4ML ~~LOC~~ SOLN
40.0000 mg | SUBCUTANEOUS | Status: DC
  Administered 2020-06-05: 40 mg via SUBCUTANEOUS
  Filled 2020-06-05 (×2): qty 0.4

## 2020-06-05 MED ORDER — POTASSIUM CHLORIDE CRYS ER 20 MEQ PO TBCR
40.0000 meq | EXTENDED_RELEASE_TABLET | Freq: Once | ORAL | Status: AC
  Administered 2020-06-05: 11:00:00 40 meq via ORAL
  Filled 2020-06-05: qty 2

## 2020-06-05 MED ORDER — VENLAFAXINE HCL ER 75 MG PO CP24
75.0000 mg | ORAL_CAPSULE | Freq: Every day | ORAL | Status: DC
  Administered 2020-06-05: 22:00:00 75 mg via ORAL
  Filled 2020-06-05: qty 1

## 2020-06-05 MED ORDER — GUAIFENESIN ER 600 MG PO TB12
600.0000 mg | ORAL_TABLET | Freq: Two times a day (BID) | ORAL | Status: DC
  Administered 2020-06-05 – 2020-06-06 (×3): 600 mg via ORAL
  Filled 2020-06-05 (×5): qty 1

## 2020-06-05 MED ORDER — BISACODYL 10 MG RE SUPP: 10.0000 mg | Freq: Every day | RECTAL | Status: DC | PRN

## 2020-06-05 MED ORDER — FLUTICASONE PROPIONATE 50 MCG/ACT NA SUSP
1.0000 | Freq: Every day | NASAL | Status: DC
  Administered 2020-06-05 – 2020-06-06 (×2): 1 via NASAL
  Filled 2020-06-05: qty 16

## 2020-06-05 MED ORDER — LETROZOLE 2.5 MG PO TABS
2.5000 mg | ORAL_TABLET | Freq: Every day | ORAL | Status: DC
  Administered 2020-06-05: 2.5 mg via ORAL
  Filled 2020-06-05 (×2): qty 1

## 2020-06-05 NOTE — Consult Note (Signed)
NAME: Sue Wong  DOB: Jul 26, 1966  MRN: [id number]  Date/Time: 06/05/2020 3:00 PM  REQUESTING PROVIDER: Dr. Louanne Belton Subjective:  REASON FOR CONSULT: Cellulitis right arm ? Sue Wong is a 54 y.o. female with a history of Right breast cancer, chemotherapy, status post bilateral mastectomy, right axillary clearance, radiation therapy and now on letrozole presents to the ED on 06/04/2020 with right arm erythema, swelling and fever of 1 day duration. 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  As per the patient more than a month ago she had a tick bite while working in the yard and she noted the right arm was swelling with erythema and went to urgent care and was given doxycycline.  She then returned to her PCP on 05/05/2020 with persisting redness and was given another 2-week course of doxycycline.  During that visit her [facility] spotted fever test came back as !:64 IgG, Lyme test was negative.  She says the redness got better and eventually went away.  Then over the weekend she was near the pool and also in the sun.  Then on Wednesday she noted erythema over the right arm along with the back and she was also having a fever of 102 temperature.  So she came to the hospital yesterday.  In the ED vitals were temperature of 97.7, BP 122/100, pulse ox of 97%, heart rate of 97.  Blood culture was sent.  She was again started on IV doxycycline. I am asked to see the patient for the same. Says while working in the yard in [place] 6 weeks ago she was bit by a tick.  She is taken nearly 3 to 4 weeks of p.o. doxycycline. She was diagnosed with CA breast in 2017 underwent neoadjuvant chemotherapy followed by right mastectomy as well as prophylactic left mastectomy.  She also underwent left axillary adenectomy, followed by radiation and then now she is on letrozole as the tumor is ER plus PR plus HER-2 negative. She says she has had lymphedema since the surgery. She has had one episode of cellulitis a year  ago. She has a cat at home but denies being scratched any time.  Past Medical History:  Diagnosis Date  . Arthritis   . Breast cancer ([place])   . GERD (gastroesophageal reflux disease)   . Psoriasis   . [place] spotted fever     Past Surgical History:  Procedure Laterality Date  . BREAST SURGERY Bilateral 1/9 and 12/27/16    Social History   Socioeconomic History  . Marital status: Married    Spouse name: Not on file  . Number of children: Not on file  . Years of education: Not on file  . Highest education level: Not on file  Occupational History  . Not on file  Tobacco Use  . Smoking status: Never Smoker  . Smokeless tobacco: Never Used  Vaping Use  . Vaping Use: Never used  Substance and Sexual Activity  . Alcohol use: Never  . Drug use: Never  . Sexual activity: Not on file  Other Topics Concern  . Not on file  Social History Narrative  . Not on file   Social Determinants of Health   Financial Resource Strain:   . Difficulty of Paying Living Expenses:   Food Insecurity:   . Worried About Charity fundraiser in the Last Year:   . Arboriculturist in the Last Year:   Transportation Needs:   . Film/video editor (Medical):   Marland Kitchen  Lack of Transportation (Non-Medical):   Physical Activity:   . Days of Exercise per Week:   . Minutes of Exercise per Session:   Stress:   . Feeling of Stress :   Social Connections:   . Frequency of Communication with Friends and Family:   . Frequency of Social Gatherings with Friends and Family:   . Attends Religious Services:   . Active Member of Clubs or Organizations:   . Attends Archivist Meetings:   Marland Kitchen Marital Status:   Intimate Partner Violence:   . Fear of Current or Ex-Partner:   . Emotionally Abused:   Marland Kitchen Physically Abused:   . Sexually Abused:     Family History  Problem Relation Age of Onset  . Hypertension Mother   . Diabetes Father   . Prostate cancer Father   . Breast cancer Sister   .  Diabetes Sister    Allergies  Allergen Reactions  . Codeine Itching    Severe itching   . Oxycodone Itching    Severe itching    ? Current Facility-Administered Medications  Medication Dose Route Frequency Provider Last Rate Last Admin  . 0.9 %  sodium chloride infusion  250 mL Intravenous PRN Pokhrel, Laxman, MD 10 mL/hr at 06/05/20 0830 250 mL at 06/05/20 0830  . acetaminophen (TYLENOL) tablet 650 mg  650 mg Oral Q6H PRN Pokhrel, Laxman, MD       Or  . acetaminophen (TYLENOL) suppository 650 mg  650 mg Rectal Q6H PRN Pokhrel, Laxman, MD      . bisacodyl (DULCOLAX) suppository 10 mg  10 mg Rectal Daily PRN Pokhrel, Laxman, MD      . calcium-vitamin D (OSCAL WITH D) 500-200 MG-UNIT per tablet 1 tablet  1 tablet Oral Daily Pokhrel, Laxman, MD   1 tablet at 06/05/20 0824  . ceFAZolin (ANCEF) IVPB 1 g/50 mL premix  1 g Intravenous Q8H Rocky Morel, RPH 100 mL/hr at 06/05/20 1126 1 g at 06/05/20 1126  . docusate sodium (COLACE) capsule 100 mg  100 mg Oral BID Pokhrel, Laxman, MD   100 mg at 06/05/20 0824  . doxycycline (VIBRAMYCIN) 100 mg in sodium chloride 0.9 % 250 mL IVPB  100 mg Intravenous Q12H Pokhrel, Laxman, MD 125 mL/hr at 06/05/20 0832 100 mg at 06/05/20 0832  . enoxaparin (LOVENOX) injection 40 mg  40 mg Subcutaneous Q24H Pokhrel, Laxman, MD   40 mg at 06/05/20 0826  . fluticasone (FLONASE) 50 MCG/ACT nasal spray 1 spray  1 spray Each Nare Daily Pokhrel, Laxman, MD   1 spray at 06/05/20 1302  . guaiFENesin (MUCINEX) 12 hr tablet 600 mg  600 mg Oral BID Pokhrel, Laxman, MD   600 mg at 06/05/20 1033  . ibuprofen (ADVIL) tablet 400 mg  400 mg Oral Q6H PRN Sharion Settler, NP   400 mg at 06/05/20 0515  . letrozole [facility]) tablet 2.5 mg  2.5 mg Oral QHS Pokhrel, Laxman, MD      . ondansetron (ZOFRAN) tablet 4 mg  4 mg Oral Q6H PRN Pokhrel, Laxman, MD       Or  . ondansetron (ZOFRAN) injection 4 mg  4 mg Intravenous Q6H PRN Pokhrel, Laxman, MD      . pantoprazole (PROTONIX) EC  tablet 40 mg  40 mg Oral Daily PRN Pokhrel, Laxman, MD      . polyethylene glycol (MIRALAX / GLYCOLAX) packet 17 g  17 g Oral Daily PRN Pokhrel, Corrie Mckusick, MD      .  sodium chloride flush (NS) 0.9 % injection 3 mL  3 mL Intravenous Q12H Pokhrel, Laxman, MD      . sodium chloride flush (NS) 0.9 % injection 3 mL  3 mL Intravenous PRN Pokhrel, Laxman, MD      . venlafaxine XR (EFFEXOR-XR) 24 hr capsule 75 mg  75 mg Oral QHS Pokhrel, Laxman, MD         Abtx:  Anti-infectives (From admission, onward)   Start     Dose/Rate Route Frequency Ordered Stop   06/05/20 0830  ceFAZolin (ANCEF) IVPB 1 g/50 mL premix     Discontinue     1 g 100 mL/hr over 30 Minutes Intravenous Every 8 hours 06/05/20 0808     06/05/20 0715  doxycycline (VIBRAMYCIN) 100 mg in sodium chloride 0.9 % 250 mL IVPB     Discontinue     100 mg 125 mL/hr over 120 Minutes Intravenous Every 12 hours 06/05/20 0709     06/04/20 1730  doxycycline (VIBRAMYCIN) 100 mg in sodium chloride 0.9 % 250 mL IVPB        100 mg 125 mL/hr over 120 Minutes Intravenous  Once 06/04/20 1715 06/04/20 2130      REVIEW OF SYSTEMS:  Const:  fever,  chills, negative weight loss Eyes: negative diplopia or visual changes, negative eye pain ENT: negative coryza, negative sore throat Resp: negative cough, hemoptysis, dyspnea Cards: negative for chest pain, palpitations, lower extremity edema GU: negative for frequency, dysuria and hematuria GI: Negative for abdominal pain, diarrhea, bleeding, constipation Skin: As above Heme: negative for easy bruising and gum/nose bleeding MS: negative for myalgias, arthralgias, back pain and muscle weakness Neurolo:negative for headaches, dizziness, vertigo, memory problems  Psych: negative for feelings of anxiety, depression  Endocrine: negative for thyroid, diabetes Allergy/Immunology- negative for any medication or food allergies ? Pertinent Positives include : Objective:  VITALS:  BP 110/77 (BP Location: Left  Arm)   Pulse 91   Temp 98.2 F (36.8 C)   Resp 16   Ht 5' (1.524 m)   Wt 70.8 kg   SpO2 99%   BMI 30.47 kg/m  PHYSICAL EXAM:  General: Alert, cooperative, no distress, appears stated age.  Head: Normocephalic, without obvious abnormality, atraumatic. Eyes: Conjunctivae clear, anicteric sclerae. Pupils are equal ENT Nares normal. No drainage or sinus tenderness. Lips, mucosa, and tongue normal. No Thrush Neck: Supple, symmetrical, no adenopathy, thyroid: non tender no carotid bruit and no JVD. Back: No CVA tenderness. Lungs: Clear to auscultation bilaterally. No Wheezing or Rhonchi. No rales. Heart: Regular rate and rhythm, no murmur, rub or gallop. Abdomen: Soft, non-tender,not distended. Bowel sounds normal. No masses Extremities: Right arm lymphedema Skin over the forearm is erythematous some tenderness on touching Also the right breast has some erythema and upper abdomen has erythematous patch        Skin: As above Lymph: Cervical, supraclavicular normal. Neurologic: Grossly non-focal Pertinent Labs Lab Results CBC    Component Value Date/Time   WBC 10.2 06/05/2020 0748   RBC 3.57 (L) 06/05/2020 0748   HGB 11.3 (L) 06/05/2020 0748   HGB 12.8 10/15/2013 1630   HCT 34.2 (L) 06/05/2020 0748   HCT 37.2 10/15/2013 1630   PLT 202 06/05/2020 0748   PLT 262 10/15/2013 1630   MCV 95.8 06/05/2020 0748   MCV 93 10/15/2013 1630   MCH 31.7 06/05/2020 0748   MCHC 33.0 06/05/2020 0748   RDW 12.7 06/05/2020 0748   RDW 12.9 10/15/2013 1630   LYMPHSABS  1.2 06/04/2020 1553   MONOABS 0.6 06/04/2020 1553   EOSABS 0.1 06/04/2020 1553   BASOSABS 0.0 06/04/2020 1553    CMP Latest Ref Rng & Units 06/05/2020 06/04/2020 10/15/2013  Glucose 70 - 99 mg/dL 104(H) 110(H) 91  BUN 6 - 20 mg/dL _0 Creatinine 0.44 - 1.00 mg/dL 0.85 1.22(H) 1.16  Sodium 135 - 145 mmol/L 137 135 140  Potassium 3.5 - 5.1 mmol/L 3.4(L) 3.6 3.9  Chloride 98 - 111 mmol/L 104 98 103  CO2 22 - 32 mmol/L  _1 Calcium 8.9 - 10.3 mg/dL 8.5(L) 9.0 9.4  Total Protein 6.5 - 8.1 g/dL 6.3(L) 7.6 -  Total Bilirubin 0.3 - 1.2 mg/dL 0.6 0.9 -  Alkaline Phos 38 - 126 U/L 62 72 -  AST 15 - 41 U/L 24 27 -  ALT 0 - 44 U/L 28 33 -      Microbiology: Recent Results (from the past 240 hour(s))  Blood culture (routine x 2)     Status: None (Preliminary result)   Collection Time: 06/04/20  5:50 PM   Specimen: BLOOD  Result Value Ref Range Status   Specimen Description BLOOD LEFT ANTECUBITAL  Final   Special Requests   Final    BOTTLES DRAWN AEROBIC AND ANAEROBIC Blood Culture results may not be optimal due to an inadequate volume of blood received in culture bottles   Culture   Final    NO GROWTH < 12 HOURS Performed at [facility], [address] [place], [place] [address]    Report Status PENDING  Incomplete  Blood culture (routine x 2)     Status: None (Preliminary result)   Collection Time: 06/04/20  5:50 PM   Specimen: BLOOD  Result Value Ref Range Status   Specimen Description BLOOD BLOOD LEFT ARM  Final   Special Requests   Final    BOTTLES DRAWN AEROBIC AND ANAEROBIC Blood Culture adequate volume   Culture   Final    NO GROWTH < 12 HOURS Performed at [facility], [address] [place], [place] [address]    Report Status PENDING  Incomplete  SARS Coronavirus 2 by RT PCR (hospital order, performed in [place] hospital lab) Nasopharyngeal Nasopharyngeal Swab     Status: None   Collection Time: 06/04/20  5:50 PM   Specimen: Nasopharyngeal Swab  Result Value Ref Range Status   SARS Coronavirus 2 NEGATIVE NEGATIVE Final    Comment: (NOTE) SARS-CoV-2 target nucleic acids are NOT DETECTED.  The SARS-CoV-2 RNA is generally detectable in upper and lower respiratory specimens during the acute phase of infection. The lowest concentration of SARS-CoV-2 viral copies this assay can detect is 250 copies / mL. A negative result does not preclude SARS-CoV-2  infection and should not be used as the sole basis for treatment or other patient management decisions.  A negative result may occur with improper specimen collection / handling, submission of specimen other than nasopharyngeal swab, presence of viral mutation(s) within the areas targeted by this assay, and inadequate number of viral copies (<250 copies / mL). A negative result must be combined with clinical observations, patient history, and epidemiological information.  Fact Sheet for Patients:   StrictlyIdeas.no  Fact Sheet for Healthcare Providers: BankingDealers.co.za  This test is not yet approved or  cleared by the [place] FDA and has been authorized for detection and/or diagnosis of SARS-CoV-2 by FDA under an Emergency Use Authorization (EUA).  This EUA will remain  in effect (meaning this test can be used) for the duration of the COVID-19 declaration under Section 564(b)(1) of the Act, 21 U.S.C. section 360bbb-3(b)(1), unless the authorization is terminated or revoked sooner.  Performed at [facility], [address] [place], [place] [address]     IMAGING RESULTS: Doppler ultrasound right arm no DVT I have personally reviewed the films ? Impression/Recommendation ? ?54 year old female with right upper extremity lymphedema following breasts cancer surgery presents with erythema of the arm. Cellulitis of the right upper extremities with underlying lymphedema. This is not Lyme disease or [facility] or spotted fever. She does not need any further doxycycline as she is already being treated with 3 weeks of doxycycline as outpatient. There is a possibility that she could have had doxycycline induced photosensitivity as she had a rash on the face and the back of the neck as well. so even if she had [facility] or spotted fever or Lyme she has been treated.  The  RMSF serology of 1:64 is very likely a false  positive test. Start cefazolin 2 g IV every 8.  Need to keep the arm elevated. If she improves significantly tomorrow than she could be switched over to p.o. Keflex  every 6 hours on discharge. Recommend compression wraps for the lymphedema of the right arm.  CA right breast.  ER positive, PR positive HER-2 negative status post chemo, bilateral mastectomy, has bilateral breast reconstruction, right breast radiation and now on letrozole ___________________________________________________ Discussed with patient, and her husband and requesting provider ID will follow her peripherally this weekend call if needed. Note:  This document was prepared using Dragon voice recognition software and may include unintentional dictation errors.

## 2020-06-05 NOTE — Progress Notes (Signed)
PROGRESS NOTE  Sue Wong YIF:[id number] DOB: September 07, 1966 DOA: 06/04/2020 PCP: Adin Hector, MD   LOS: 1 day   Brief narrative: As per HPI,  Sue Wong is a 54 y.o. female with past medical history of metastatic breast cancer status post ectomy in 2018 with mild residual lymphedema and prior history of [place] spotted fever treated twice with doxycycline with good remission, presented to the hospital  with 1 day history of worsening redness, swelling edema of the right upper extremity.  Patient also had mild febrile illness associated with that and complained of mild headache.  She does have some postnasal drip as well.  Denies any sore throat or difficulty swallowing.  Denied vomiting but had mild nausea.  Patient denies current joint pain or swelling.    Patient does have history of [facility] spotted fever in May 2021 and was treated with 7-day course of doxycycline.  Subsequently she had a relapse in 2 weeks of treatment and underwent her second course of doxycycline with remission until yesterday when she noticed erythema ,edema and swelling of the right upper extremity.  Patient states that she has been lately gardening but denies any obvious exposure to chemicals, detergents/toxins.  Denies any shortness of breath ,chest pain but has mild cough from postnasal drip.  Denies any abdominal pain, vomiting or diarrhea.  Denies urinary urgency, frequency or dysuria.  Denies changes in her medications.  Patient reported fever of 102 F last night and took Tylenol yesterday and today.  ED Course: In the ED, patient was afebrile but had taken Tylenol prior to coming to the hospital.   WBC at 14.1.  Patient had significant redness swelling and spreading erythema of the right upper extremity so she was considered for admission to the hospital.  Assessment/Plan:  Principal Problem:   Cellulitis Active Problems:   Fever   Lymphedema   Fever erythema edema of the right  upper extremity suggestive of cellulitis.  She had febrile illness yesterday with mild leukocytosis today.  Patient does have history of [facility] spotted fever treated twice with doxycycline.  Patient received 1 dose of Dexilant in the ED we will continue with that for now.  Will get infectious disease consultation in a.m.  Elevation of the right upper extremity.  Follow blood cultures sent from the ED.  Will obtain venous duplex ultrasound of the right upper extremity.  Mild hypokalemia. Will replenish PO. Check levels in am.  History of breast cancer status postmastectomy in remission with residual lymphedema of the right upper extremity.  History of GERD.  Stable.  Nasal drip with mild cough.  Will consider Flonase spray.  History of psoriasis on the scalp.  Controlled.  DVT prophylaxis: enoxaparin (LOVENOX) injection 40 mg Start: 06/05/20 1000   Code Status:  Full code   Family Communication: None   Status is: Inpatient  Remains inpatient appropriate because:IV treatments appropriate due to intensity of illness or inability to take PO and Inpatient level of care appropriate due to severity of illness   Dispo: The patient is from: Home              Anticipated d/c is to: Home              Anticipated d/c date is: 2 days              Patient currently is not medically stable to d/c.       Consultants:  Infectious disease  Procedures:  none  Antibiotics:   Cefazolin  doxycycline  Anti-infectives (From admission, onward)   Start     Dose/Rate Route Frequency Ordered Stop   06/05/20 0830  ceFAZolin (ANCEF) IVPB 1 g/50 mL premix     Discontinue     1 g 100 mL/hr over 30 Minutes Intravenous Every 8 hours 06/05/20 0808     06/05/20 0715  doxycycline (VIBRAMYCIN) 100 mg in sodium chloride 0.9 % 250 mL IVPB     Discontinue     100 mg 125 mL/hr over 120 Minutes Intravenous Every 12 hours 06/05/20 0709     06/04/20 1730  doxycycline (VIBRAMYCIN) 100 mg in  sodium chloride 0.9 % 250 mL IVPB        100 mg 125 mL/hr over 120 Minutes Intravenous  Once 06/04/20 1715 06/04/20 2130     Subjective: Today, patient was seen and examined at bedside. Has new redness on the trunk, slight fade on the hands and back.  Objective: Vitals:   06/05/20 0433 06/05/20 0804  BP: 112/64 109/70  Pulse: (!) 104 82  Resp: 16 16  Temp: 99.4 F (37.4 C) 98.7 F (37.1 C)  SpO2: 97% 99%    Intake/Output Summary (Last 24 hours) at 06/05/2020 1106 Last data filed at 06/05/2020 1058 Gross per 24 hour  Intake 360 ml  Output --  Net 360 ml   Filed Weights   06/04/20 1548  Weight: 70.8 kg   Body mass index is 30.47 kg/m.   Physical Exam: GENERAL: Patient is alert awake and oriented. Not in obvious distress. HENT: No scleral pallor or icterus. Pupils equally reactive to light. Oral mucosa is moist NECK: is supple, no gross swelling noted. CHEST: Clear to auscultation. No crackles or wheezes.  Diminished breath sounds bilaterally. CVS: S1 and S2 heard, no murmur. Regular rate and rhythm.  ABDOMEN: Soft, non-tender, bowel sounds are present. EXTREMITIES: Right upper extremity lymphedema with blanching erythema, edema. CNS: Cranial nerves are intact. No focal motor deficits. SKIN: warm and dry with erythema of right upper extremity, anterior abdomen and chest wall.  Data Review: I have personally reviewed the following laboratory data and studies,  CBC: Recent Labs  Lab 06/04/20 1553 06/05/20 0748  WBC 14.1* 10.2  NEUTROABS 12.1*  --   HGB 12.6 11.3*  HCT 38.3 34.2*  MCV 97.0 95.8  PLT 219 025   Basic Metabolic Panel: Recent Labs  Lab 06/04/20 1553 06/05/20 0748  NA 135 137  K 3.6 3.4*  CL 98 104  CO2 27 24  GLUCOSE 110* 104*  BUN 18 13  CREATININE 1.22* 0.85  CALCIUM 9.0 8.5*   Liver Function Tests: Recent Labs  Lab 06/04/20 1553 06/05/20 0748  AST 27 24  ALT 33 28  ALKPHOS 72 62  BILITOT 0.9 0.6  PROT 7.6 6.3*  ALBUMIN 4.0 3.3*    No results for input(s): LIPASE, AMYLASE in the last 168 hours. No results for input(s): AMMONIA in the last 168 hours. Cardiac Enzymes: No results for input(s): CKTOTAL, CKMB, CKMBINDEX, TROPONINI in the last 168 hours. BNP (last 3 results) No results for input(s): BNP in the last 8760 hours.  ProBNP (last 3 results) No results for input(s): PROBNP in the last 8760 hours.  CBG: No results for input(s): GLUCAP in the last 168 hours. Recent Results (from the past 240 hour(s))  Blood culture (routine x 2)     Status: None (Preliminary result)   Collection Time: 06/04/20  5:50 PM   Specimen:  BLOOD  Result Value Ref Range Status   Specimen Description BLOOD LEFT ANTECUBITAL  Final   Special Requests   Final    BOTTLES DRAWN AEROBIC AND ANAEROBIC Blood Culture results may not be optimal due to an inadequate volume of blood received in culture bottles   Culture   Final    NO GROWTH < 12 HOURS Performed at [facility], [address] [place], [place] [address]    Report Status PENDING  Incomplete  Blood culture (routine x 2)     Status: None (Preliminary result)   Collection Time: 06/04/20  5:50 PM   Specimen: BLOOD  Result Value Ref Range Status   Specimen Description BLOOD BLOOD LEFT ARM  Final   Special Requests   Final    BOTTLES DRAWN AEROBIC AND ANAEROBIC Blood Culture adequate volume   Culture   Final    NO GROWTH < 12 HOURS Performed at [facility], [address] [place], [place] [address]    Report Status PENDING  Incomplete  SARS Coronavirus 2 by RT PCR (hospital order, performed in [place] hospital lab) Nasopharyngeal Nasopharyngeal Swab     Status: None   Collection Time: 06/04/20  5:50 PM   Specimen: Nasopharyngeal Swab  Result Value Ref Range Status   SARS Coronavirus 2 NEGATIVE NEGATIVE Final    Comment: (NOTE) SARS-CoV-2 target nucleic acids are NOT DETECTED.  The SARS-CoV-2 RNA is generally detectable in upper and  lower respiratory specimens during the acute phase of infection. The lowest concentration of SARS-CoV-2 viral copies this assay can detect is 250 copies / mL. A negative result does not preclude SARS-CoV-2 infection and should not be used as the sole basis for treatment or other patient management decisions.  A negative result may occur with improper specimen collection / handling, submission of specimen other than nasopharyngeal swab, presence of viral mutation(s) within the areas targeted by this assay, and inadequate number of viral copies (<250 copies / mL). A negative result must be combined with clinical observations, patient history, and epidemiological information.  Fact Sheet for Patients:   StrictlyIdeas.no  Fact Sheet for Healthcare Providers: BankingDealers.co.za  This test is not yet approved or  cleared by the [place] FDA and has been authorized for detection and/or diagnosis of SARS-CoV-2 by FDA under an Emergency Use Authorization (EUA).  This EUA will remain in effect (meaning this test can be used) for the duration of the COVID-19 declaration under Section 564(b)(1) of the Act, 21 U.S.C. section 360bbb-3(b)(1), unless the authorization is terminated or revoked sooner.  Performed at [facility], [address] [place],  [address]      Studies: US Venous Img Upper Uni Right(DVT)  Result Date: 06/04/2020 CLINICAL DATA:  Swelling EXAM: RIGHT UPPER EXTREMITY VENOUS DOPPLER ULTRASOUND TECHNIQUE: Gray-scale sonography with graded compression, as well as color Doppler and duplex ultrasound were performed to evaluate the upper extremity deep venous system from the level of the subclavian vein and including the jugular, axillary, basilic, radial, ulnar and upper cephalic vein. Spectral Doppler was utilized to evaluate flow at rest and with distal augmentation maneuvers. COMPARISON:  None. FINDINGS:  Contralateral Subclavian Vein: Respiratory phasicity is normal and symmetric with the symptomatic side. No evidence of thrombus. Normal compressibility. Internal Jugular Vein: No evidence of thrombus. Normal compressibility, respiratory phasicity and response to augmentation. Subclavian Vein: No evidence of thrombus. Normal compressibility, respiratory phasicity and response to augmentation. Axillary Vein: No evidence of thrombus. Normal compressibility, respiratory  phasicity and response to augmentation. Cephalic Vein: Not visualized Basilic Vein: No evidence of thrombus. Normal compressibility, respiratory phasicity and response to augmentation. Brachial Veins: No evidence of thrombus. Normal compressibility, respiratory phasicity and response to augmentation. Radial Veins: No evidence of thrombus. Normal compressibility, respiratory phasicity and response to augmentation. Ulnar Veins: No evidence of thrombus. Normal compressibility, respiratory phasicity and response to augmentation. Venous Reflux:  None visualized. Other Findings:  None visualized. IMPRESSION: No evidence of DVT within the right upper extremity. Electronically Signed   By: Rolm Baptise M.D.   On: 06/04/2020 18:57      Flora Lipps, MD  [facility] 06/05/2020

## 2020-06-05 NOTE — Evaluation (Signed)
Occupational Therapy Evaluation Patient Details Name: Sue Wong MRN: [id number] DOB: May 10, 1966 Today's Date: 06/05/2020    History of Present Illness Sue Wong is a 54 y.o. female with past medical history of metastatic breast cancer status post ectomy in 2018 with mild residual lymphedema and prior history of [facility] spotted fever treated twice with doxycycline with good remission, presented to the hospital  with 1 day history of worsening redness, swelling edema of the right upper extremity.    Clinical Impression   Sue Wong was seen for OT evaluation this date. Prior to hospital admission, pt was Independent in all I/ADLs including working full time as Copywriter, advertising. Pt lives c husband and their cat. Pt presents to acute OT grossly Independent c ADLs and functional mobility. Swelling noted in RUE and pt demonstrates 4/5 grip bilaterally. Pt reports pain c supination of RUE but AROM supination/pronation WFL. Achieves 5 digit opposition and palm to finger translation. Decreased R shoulder external/internal rotation states r/t hx of mastectomy. No skilled acute OT needs identified. Will sign off. Please re-consult if additional OT needs arise.    Follow Up Recommendations  No OT follow up    Equipment Recommendations  None recommended by OT    Recommendations for Other Services       Precautions / Restrictions Precautions Precautions: None Restrictions Weight Bearing Restrictions: No      Mobility Bed Mobility Overal bed mobility: Independent                Transfers Overall transfer level: Independent                    Balance Overall balance assessment: Independent                                         ADL either performed or assessed with clinical judgement   ADL Overall ADL's : Independent                                       General ADL Comments: Independent don/doff B socks, simulated  UBD in standing, opening tooth paste, and ADL t/fs     Vision         Perception     Praxis      Pertinent Vitals/Pain Pain Assessment: Faces Faces Pain Scale: Hurts a little bit Pain Location: RUE Pain Descriptors / Indicators: Dull;Grimacing;Discomfort Pain Intervention(s): Limited activity within patient's tolerance     Hand Dominance Right   Extremity/Trunk Assessment Upper Extremity Assessment Upper Extremity Assessment: RUE deficits/detail RUE Deficits / Details: Swelling noted t/o RUE. Grip 4/5. Pain c supination but AROM WFL. Achieves 5 digit opposition and palm to finger translation. Decreased shoulder external/internal rotation r/t hx of mastectomy.    Lower Extremity Assessment Lower Extremity Assessment: Overall WFL for tasks assessed       Communication Communication Communication: No difficulties   Cognition Arousal/Alertness: Awake/alert Behavior During Therapy: WFL for tasks assessed/performed Overall Cognitive Status: Within Functional Limits for tasks assessed                                     General Comments       Exercises Exercises: Other exercises Other  Exercises Other Exercises: Pt educated re: OT role, d/c recs, positioning for edema mgmt Other Exercises: LBD, sup<>sit, sit<>stand, sitting/standing balance/tolerance   Shoulder Instructions      Home Living Family/patient expects to be discharged to:: Private residence Living Arrangements: Spouse/significant other Available Help at Discharge: Family;Available PRN/intermittently Type of Home: House Home Access: Stairs to enter [employer] of Steps: 5   Home Layout: One level     Bathroom Shower/Tub: Walk-in shower         Home Equipment: None          Prior Functioning/Environment Level of Independence: Independent        Comments: Independent c I/ADLs including working as Production assistant, radio Problem List: Increased  edema;Impaired UE functional use      OT Treatment/Interventions:      OT Goals(Current goals can be found in the care plan section) Acute Rehab OT Goals Patient Stated Goal: To return to work OT Goal Formulation: With patient Time For Goal Achievement: 06/19/20 Potential to Achieve Goals: Good  OT Frequency:     Barriers to D/C:            Co-evaluation              AM-PAC OT "6 Clicks" Daily Activity     Outcome Measure Help from another person eating meals?: A Little Help from another person taking care of personal grooming?: A Little Help from another person toileting, which includes using toliet, bedpan, or urinal?: None Help from another person bathing (including washing, rinsing, drying)?: None Help from another person to put on and taking off regular upper body clothing?: None Help from another person to put on and taking off regular lower body clothing?: None 6 Click Score: 22   End of Session    Activity Tolerance: Patient tolerated treatment well Patient left: in bed;with call bell/phone within reach  OT Visit Diagnosis: Muscle weakness (generalized) (M62.81)                Time: 1884-1660 OT Time Calculation (min): 15 min Charges:  OT General Charges $OT Visit: 1 Visit OT Evaluation $OT Eval Low Complexity: 1 Low OT Treatments $Self Care/Home Management : 8-22 mins  Dessie Coma, M.S. OTR/L  06/05/20, 4:40 PM

## 2020-06-05 NOTE — Progress Notes (Signed)
Pharmacy Antibiotic Note  LACEY WALLMAN is a 54 y.o. female admitted on 06/04/2020 with cellulitis.  Pharmacy has been consulted for cefazolin dosing.   Plan: Cefazolin 1 g IV q8h   Height: 5' (152.4 cm) Weight: 70.8 kg (156 lb) IBW/kg (Calculated) : 45.5  Temp (24hrs), Avg:98.6 F (37 C), Min:97.7 F (36.5 C), Max:99.4 F (37.4 C)  Recent Labs  Lab 06/04/20 1553  WBC 14.1*  CREATININE 1.22*    Estimated Creatinine Clearance: 46.8 mL/min (A) (by C-G formula based on SCr of 1.22 mg/dL (H)).    Allergies  Allergen Reactions  . Codeine Itching    Severe itching   . Oxycodone Itching    Severe itching    Antimicrobials this admission: Doxycycline 7/1 >> Cefazolin 7/2 >>  Dose adjustments this admission:   Microbiology results: 7/1 BCx: NGTD x2   Thank you for allowing pharmacy to be a part of this patient's care.  Rocky Morel 06/05/2020 8:09 AM

## 2020-06-06 LAB — CBC WITH DIFFERENTIAL/PLATELET
Abs Immature Granulocytes: 0.03 10*3/uL (ref 0.00–0.07)
Basophils Absolute: 0 10*3/uL (ref 0.0–0.1)
Basophils Relative: 1 %
Eosinophils Absolute: 0.1 10*3/uL (ref 0.0–0.5)
Eosinophils Relative: 2 %
HCT: 38.3 % (ref 36.0–46.0)
Hemoglobin: 12.6 g/dL (ref 12.0–15.0)
Immature Granulocytes: 1 %
Lymphocytes Relative: 27 %
Lymphs Abs: 1.7 10*3/uL (ref 0.7–4.0)
MCH: 31.7 pg (ref 26.0–34.0)
MCHC: 32.9 g/dL (ref 30.0–36.0)
MCV: 96.5 fL (ref 80.0–100.0)
Monocytes Absolute: 0.6 10*3/uL (ref 0.1–1.0)
Monocytes Relative: 10 %
Neutro Abs: 3.8 10*3/uL (ref 1.7–7.7)
Neutrophils Relative %: 59 %
Platelets: 245 10*3/uL (ref 150–400)
RBC: 3.97 MIL/uL (ref 3.87–5.11)
RDW: 12.6 % (ref 11.5–15.5)
WBC: 6.3 10*3/uL (ref 4.0–10.5)
nRBC: 0 % (ref 0.0–0.2)

## 2020-06-06 MED ORDER — CEPHALEXIN 500 MG PO CAPS: 500.0000 mg | ORAL_CAPSULE | Freq: Four times a day (QID) | ORAL | 0 refills | Status: AC

## 2020-06-06 MED ORDER — FLUTICASONE PROPIONATE 50 MCG/ACT NA SUSP: 1.0000 | Freq: Every day | NASAL | 2 refills | Status: DC

## 2020-06-06 NOTE — Progress Notes (Signed)
Pharmacy Antibiotic Note  Sue Wong is a 54 y.o. female admitted on 06/04/2020 with cellulitis.  Pharmacy has been consulted for cefazolin dosing. ID following    Plan: Continue Cefazolin 2g IV q8h   Height: 5' (152.4 cm) Weight: 70.8 kg (156 lb) IBW/kg (Calculated) : 45.5  Temp (24hrs), Avg:98.4 F (36.9 C), Min:98.2 F (36.8 C), Max:98.5 F (36.9 C)  Recent Labs  Lab 06/04/20 1553 06/05/20 0748 06/06/20 0454  WBC 14.1* 10.2 6.3  CREATININE 1.22* 0.85  --     Estimated Creatinine Clearance: 67.2 mL/min (by C-G formula based on SCr of 0.85 mg/dL).    Allergies  Allergen Reactions  . Codeine Itching    Severe itching   . Oxycodone Itching    Severe itching    Antimicrobials this admission: Doxycycline 7/1 >> 7/2  Cefazolin 7/2 >>  Microbiology results: 7/1 BCx: NGTD x2  Thank you for allowing pharmacy to be a part of this patient's care.  Pernell Dupre, PharmD, BCPS Clinical Pharmacist 06/06/2020 12:48 PM

## 2020-06-06 NOTE — Discharge Summary (Signed)
Physician Discharge Summary  [name] WGY:[id number] DOB: 09/21/1966 DOA: 06/04/2020  PCP: Adin Hector, MD  Admit date: 06/04/2020 Discharge date: 06/06/2020  Admitted From: Home  Discharge disposition: Home   Recommendations for Outpatient Follow-Up:   . Follow up with your primary care provider in one week.  . Check CBC, BMP, magnesium in the next visit. . Patient has been prescribed a course of Keflex.  Very unlikely to be [facility] spotted fever rash at this time.  Patient has been adequately treated with doxycycline in the past.   Discharge Diagnosis:   Principal Problem:   Cellulitis Active Problems:   Fever   Lymphedema   Discharge Condition: Improved.  Diet recommendation:  Regular.  Wound care: None.  Code status: Full.   History of Present Illness:   Sue Chilcott Trollingeris a 54 y.o.femalewith past medical history of metastatic breast cancer status post ectomy in 2018 with mild residual lymphedema and prior history of [place] spotted fever treated twice with doxycycline with good remission,presented to the hospital with 1 day history of worsening redness,swelling edema of the right upper extremity. Patient also had mild febrile illness associated with that and complained of mild headache. She does have some postnasal drip as well. Patient does have history of [facility] spotted fever in May 2021 and was treated with 7-day course of doxycycline. Subsequently she had a relapse in 2 weeks of treatment and underwent her second course of doxycycline with remission until day prior to presentation when she noticed erythema ,edema and swelling of the right upper extremity. Patient states that she has been lately gardening but denies any obvious exposure to chemicals,detergents/toxins.  Patient reported fever of 102 F at home.In the ED, patient was afebrile.WBC at 14.1. Patient had significant redness swelling and spreading erythema of the  right upper extremity so she was considered for admission to the hospital.   Hospital Course:   Following conditions were addressed during hospitalization as listed below,  Fever, erythema edema of the right upper extremity suggestive of cellulitis. She had febrile illness on day prior with mild leukocytosis on presentation.  Patient was initially started on doxycycline and infectious disease was consulted.  Low suspicion for  [facility] of spotted fever at this time.  ID recommended IV cefazolin followed by oral Keflex on discharge.  At this time her erythema has improved.  No fever or any other signs of infection at this time.  Leukocytosis has improved.  Blood cultures are negative.  Patient will complete total of 7-day course of antibiotic and follow-up with your primary care physician.  She was advised to keep her extremity elevated with compression/lymphedema pump.  Mild hypokalemia.   On presentation.  Improved with replacement.  History of breast cancer status postmastectomy in remission with residual lymphedema of the right upper extremity.  History of GERD.Stable.  Nasal drip with mild cough.Will prescribe Flonase on discharge  History of psoriasis on the scalp. Controlled.  Disposition.  At this time, patient is stable for disposition home with PCP follow-up in 1 week.  Spoke with the patient's husband at bedside as well  Medical Consultants:    Infectious disease  Procedures:    None Subjective:   Today, patient has any fever, chills or rigor.  Erythema has improved on her extremity and trunk  Discharge Exam:   Vitals:   06/06/20 0410 06/06/20 0857  BP: 102/66 107/63  Pulse: 83 86  Resp: 16 20  Temp: 98.4 F (36.9  C) 98.5 F (36.9 C)  SpO2: 97% 99%   Vitals:   06/05/20 1953 06/06/20 0038 06/06/20 0410 06/06/20 0857  BP: 126/80 124/79 102/66 107/63  Pulse: 85 80 83 86  Resp: 16 16 16 20   Temp: 98.5 F (36.9 C) 98.4 F (36.9 C) 98.4 F (36.9  C) 98.5 F (36.9 C)  TempSrc:    Oral  SpO2: 99% 97% 97% 99%  Weight:      Height:        General: Alert awake, not in obvious distress HENT: pupils equally reacting to light,  No scleral pallor or icterus noted. Oral mucosa is moist.  Chest:  Clear breath sounds.  Diminished breath sounds bilaterally. No crackles or wheezes.  CVS: S1 &S2 heard. No murmur.  Regular rate and rhythm. Abdomen: Soft, nontender, nondistended.  Bowel sounds are heard.   Extremities: No cyanosis, clubbing or lower extremity edema.  Right upper extremity with lymphedema and blanching erythema. Psych: Alert, awake and oriented, normal mood CNS:  No cranial nerve deficits.  Power equal in all extremities.   Skin: Warm and dry.  Erythema of the right upper extremity anterior abdominal wall has lessened.  The results of significant diagnostics from this hospitalization (including imaging, microbiology, ancillary and laboratory) are listed below for reference.     Diagnostic Studies:   US Venous Img Upper Uni Right(DVT)  Result Date: 06/04/2020 CLINICAL DATA:  Swelling EXAM: RIGHT UPPER EXTREMITY VENOUS DOPPLER ULTRASOUND TECHNIQUE: Gray-scale sonography with graded compression, as well as color Doppler and duplex ultrasound were performed to evaluate the upper extremity deep venous system from the level of the subclavian vein and including the jugular, axillary, basilic, radial, ulnar and upper cephalic vein. Spectral Doppler was utilized to evaluate flow at rest and with distal augmentation maneuvers. COMPARISON:  None. FINDINGS: Contralateral Subclavian Vein: Respiratory phasicity is normal and symmetric with the symptomatic side. No evidence of thrombus. Normal compressibility. Internal Jugular Vein: No evidence of thrombus. Normal compressibility, respiratory phasicity and response to augmentation. Subclavian Vein: No evidence of thrombus. Normal compressibility, respiratory phasicity and response to augmentation.  Axillary Vein: No evidence of thrombus. Normal compressibility, respiratory phasicity and response to augmentation. Cephalic Vein: Not visualized Basilic Vein: No evidence of thrombus. Normal compressibility, respiratory phasicity and response to augmentation. Brachial Veins: No evidence of thrombus. Normal compressibility, respiratory phasicity and response to augmentation. Radial Veins: No evidence of thrombus. Normal compressibility, respiratory phasicity and response to augmentation. Ulnar Veins: No evidence of thrombus. Normal compressibility, respiratory phasicity and response to augmentation. Venous Reflux:  None visualized. Other Findings:  None visualized. IMPRESSION: No evidence of DVT within the right upper extremity. Electronically Signed   By: Rolm Baptise M.D.   On: 06/04/2020 18:57     Labs:   Basic Metabolic Panel: Recent Labs  Lab 06/04/20 1553 06/05/20 0748  NA 135 137  K 3.6 3.4*  CL 98 104  CO2 27 24  GLUCOSE 110* 104*  BUN 18 13  CREATININE 1.22* 0.85  CALCIUM 9.0 8.5*   GFR Estimated Creatinine Clearance: 67.2 mL/min (by C-G formula based on SCr of 0.85 mg/dL). Liver Function Tests: Recent Labs  Lab 06/04/20 1553 06/05/20 0748  AST 27 24  ALT 33 28  ALKPHOS 72 62  BILITOT 0.9 0.6  PROT 7.6 6.3*  ALBUMIN 4.0 3.3*   No results for input(s): LIPASE, AMYLASE in the last 168 hours. No results for input(s): AMMONIA in the last 168 hours. Coagulation profile No results for input(s):  INR, PROTIME in the last 168 hours.  CBC: Recent Labs  Lab 06/04/20 1553 06/05/20 0748 06/06/20 0454  WBC 14.1* 10.2 6.3  NEUTROABS 12.1*  --  3.8  HGB 12.6 11.3* 12.6  HCT 38.3 34.2* 38.3  MCV 97.0 95.8 96.5  PLT 219 202 245   Cardiac Enzymes: No results for input(s): CKTOTAL, CKMB, CKMBINDEX, TROPONINI in the last 168 hours. BNP: Invalid input(s): POCBNP CBG: No results for input(s): GLUCAP in the last 168 hours. D-Dimer No results for input(s): DDIMER in the  last 72 hours. Hgb A1c No results for input(s): HGBA1C in the last 72 hours. Lipid Profile No results for input(s): CHOL, HDL, LDLCALC, TRIG, CHOLHDL, LDLDIRECT in the last 72 hours. Thyroid function studies No results for input(s): TSH, T4TOTAL, T3FREE, THYROIDAB in the last 72 hours.  Invalid input(s): FREET3 Anemia work up No results for input(s): VITAMINB12, FOLATE, FERRITIN, TIBC, IRON, RETICCTPCT in the last 72 hours. Microbiology Recent Results (from the past 240 hour(s))  Blood culture (routine x 2)     Status: None (Preliminary result)   Collection Time: 06/04/20  5:50 PM   Specimen: BLOOD  Result Value Ref Range Status   Specimen Description BLOOD LEFT ANTECUBITAL  Final   Special Requests   Final    BOTTLES DRAWN AEROBIC AND ANAEROBIC Blood Culture results may not be optimal due to an inadequate volume of blood received in culture bottles   Culture   Final    NO GROWTH 2 DAYS Performed at [facility], [address] [place], [place] [address]    Report Status PENDING  Incomplete  Blood culture (routine x 2)     Status: None (Preliminary result)   Collection Time: 06/04/20  5:50 PM   Specimen: BLOOD  Result Value Ref Range Status   Specimen Description BLOOD BLOOD LEFT ARM  Final   Special Requests   Final    BOTTLES DRAWN AEROBIC AND ANAEROBIC Blood Culture adequate volume   Culture   Final    NO GROWTH 2 DAYS Performed at [facility], [address] [place], [place] [address]    Report Status PENDING  Incomplete  SARS Coronavirus 2 by RT PCR (hospital order, performed in [place] hospital lab) Nasopharyngeal Nasopharyngeal Swab     Status: None   Collection Time: 06/04/20  5:50 PM   Specimen: Nasopharyngeal Swab  Result Value Ref Range Status   SARS Coronavirus 2 NEGATIVE NEGATIVE Final    Comment: (NOTE) SARS-CoV-2 target nucleic acids are NOT DETECTED.  The SARS-CoV-2 RNA is generally detectable in upper and lower respiratory  specimens during the acute phase of infection. The lowest concentration of SARS-CoV-2 viral copies this assay can detect is 250 copies / mL. A negative result does not preclude SARS-CoV-2 infection and should not be used as the sole basis for treatment or other patient management decisions.  A negative result may occur with improper specimen collection / handling, submission of specimen other than nasopharyngeal swab, presence of viral mutation(s) within the areas targeted by this assay, and inadequate number of viral copies (<250 copies / mL). A negative result must be combined with clinical observations, patient history, and epidemiological information.  Fact Sheet for Patients:   StrictlyIdeas.no  Fact Sheet for Healthcare Providers: BankingDealers.co.za  This test is not yet approved or  cleared by the [place] FDA and has been authorized for detection and/or diagnosis of SARS-CoV-2 by FDA under an Emergency Use Authorization (EUA).  This EUA will  remain in effect (meaning this test can be used) for the duration of the COVID-19 declaration under Section 564(b)(1) of the Act, 21 U.S.C. section 360bbb-3(b)(1), unless the authorization is terminated or revoked sooner.  Performed at [facility], [address] [place], [place] [address]      Discharge Instructions:   Discharge Instructions    Call MD for:  severe uncontrolled pain   Complete by: As directed    Call MD for:  temperature >100.4   Complete by: As directed    Diet general   Complete by: As directed    Discharge instructions   Complete by: As directed    Please complete the course of antibiotic.  Follow-up with your primary care provider within 1 week.  Avoid direct exposure to sun and use sunscreen when going outside.  Keep the affected extremity elevated and use compression/pump for lymphedema.  If you experience worsening rash, please seek medical  attention.   Increase activity slowly   Complete by: As directed      Allergies as of 06/06/2020      Reactions   Codeine Itching   Severe itching    Oxycodone Itching   Severe itching      Medication List    TAKE these medications   Calcium 600-D 600-400 MG-UNIT Tabs Generic drug: Calcium Carbonate-Vitamin D3 Take 1 tablet by mouth daily.   cephALEXin 500 MG capsule Commonly known as: KEFLEX Take 1 capsule (500 mg total) by mouth 4 (four) times daily for 5 days.   fluticasone 50 MCG/ACT nasal spray Commonly known as: FLONASE Place 1 spray into both nostrils daily. Start taking on: June 07, 2020   letrozole 2.5 MG tablet Commonly known as: FEMARA Take 2.5 mg by mouth every evening.   pantoprazole 40 MG tablet Commonly known as: PROTONIX Take 40 mg by mouth daily as needed (acid reflux symptoms).   venlafaxine XR 75 MG 24 hr capsule Commonly known as: EFFEXOR-XR Take 75 mg by mouth every evening.         Time coordinating discharge: 39 minutes  Signed:  Rutha Melgoza  [facility] 06/06/2020, 11:15 AM

## 2020-06-06 NOTE — Progress Notes (Signed)
Patient received and understands discharge instructions 

## 2020-06-09 LAB — CULTURE, BLOOD (ROUTINE X 2)
Culture: NO GROWTH
Culture: NO GROWTH
Special Requests: ADEQUATE

## 2020-06-11 ENCOUNTER — Other Ambulatory Visit: Payer: Self-pay | Admitting: Internal Medicine

## 2020-06-11 DIAGNOSIS — Z853 Personal history of malignant neoplasm of breast: Secondary | ICD-10-CM

## 2020-06-11 DIAGNOSIS — L0391 Acute lymphangitis, unspecified: Secondary | ICD-10-CM

## 2020-06-11 DIAGNOSIS — L03113 Cellulitis of right upper limb: Secondary | ICD-10-CM

## 2022-03-16 IMAGING — US US EXTREM  UP VENOUS*R*
1 series · 13 of 24 positions shown · non-contrast
Comparison: None.

CLINICAL DATA: Right upper extremity cellulitis status post tick
bite.



[Series 1: us venous img upper uni right (dvt) · portal-venous · 13 of 37 slices shown]
[im 1/37]
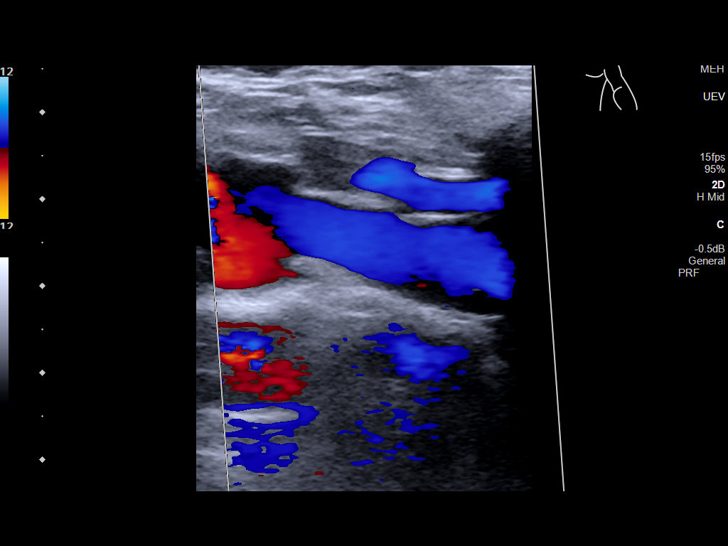
[im 4/37]
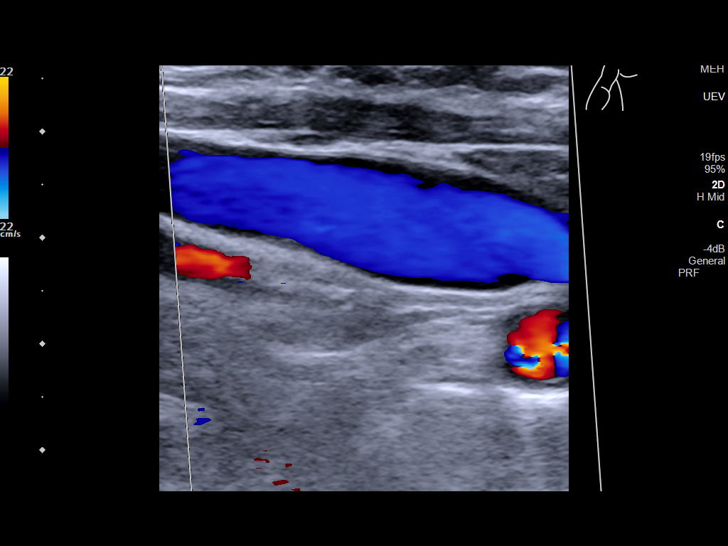
[im 7/37]
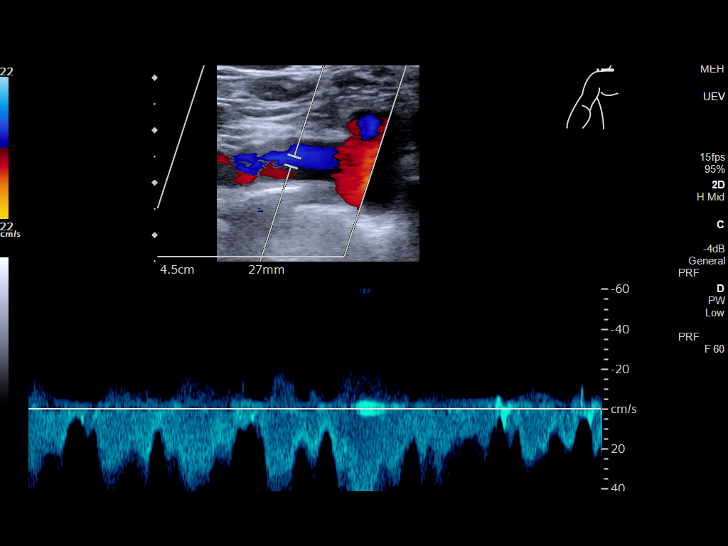
[im 10/37]
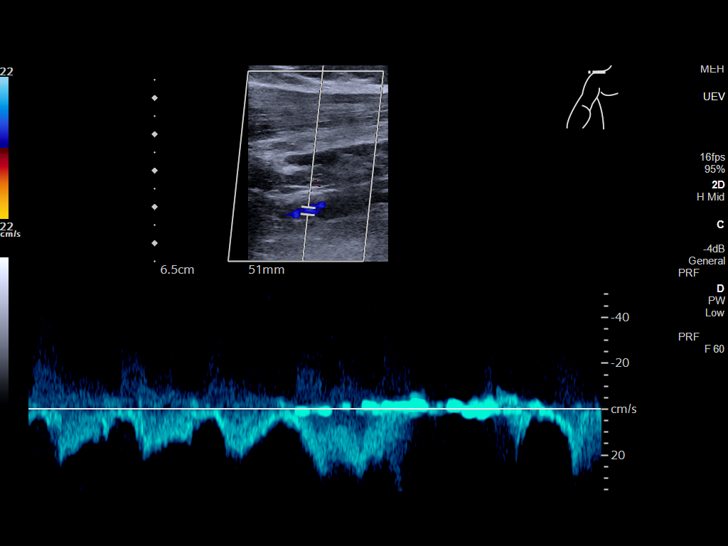
[im 13/37]
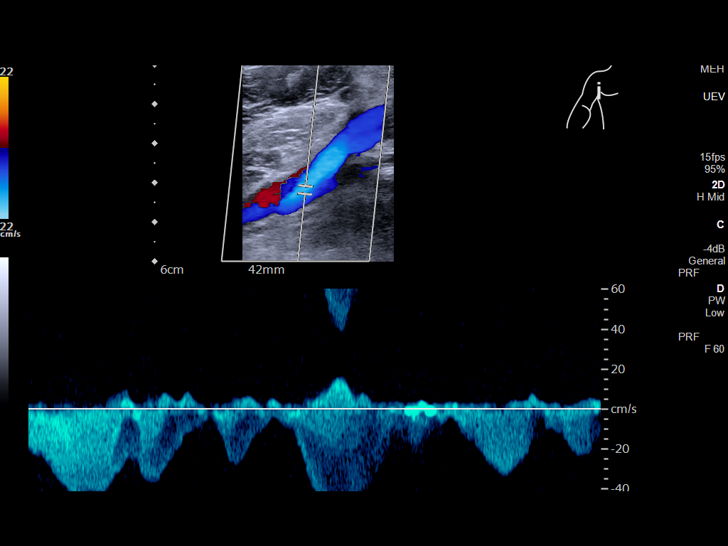
[im 16/37]
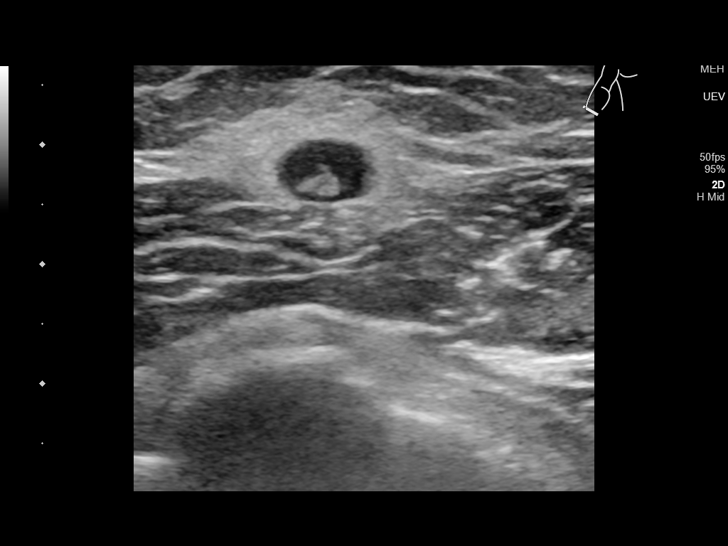
[im 19/37]
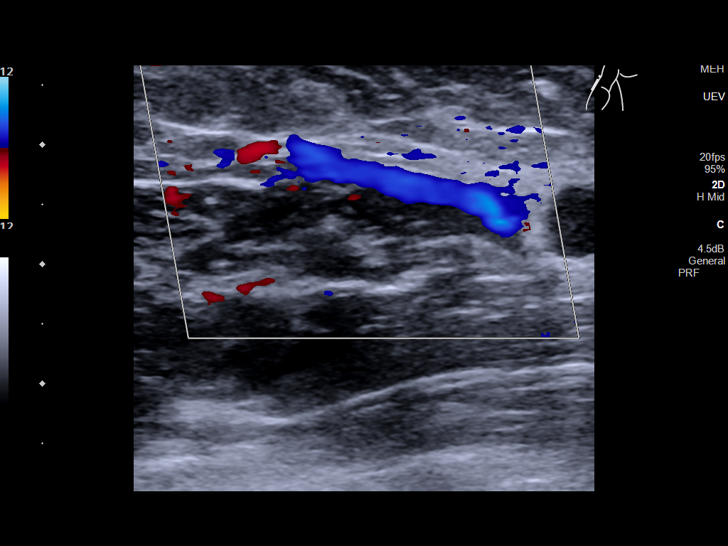
[im 21/37]
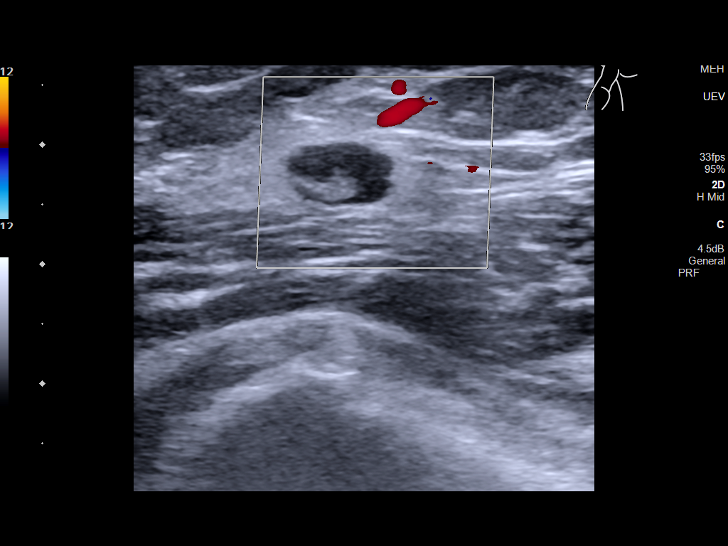
[im 24/37]
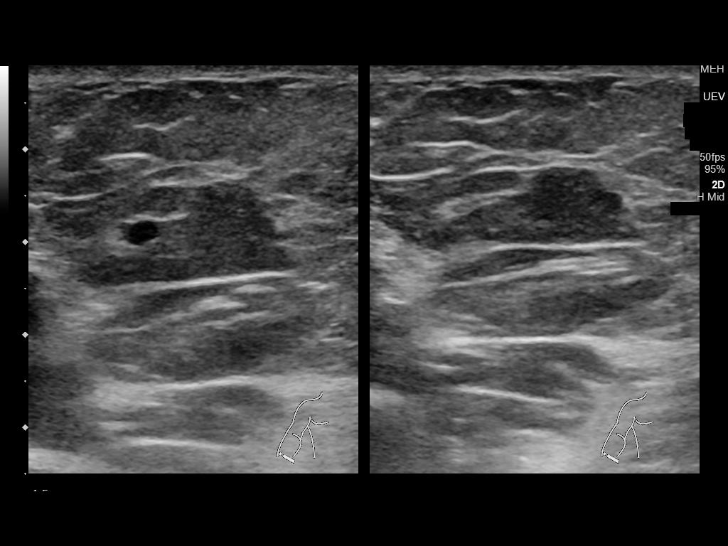
[im 27/37]
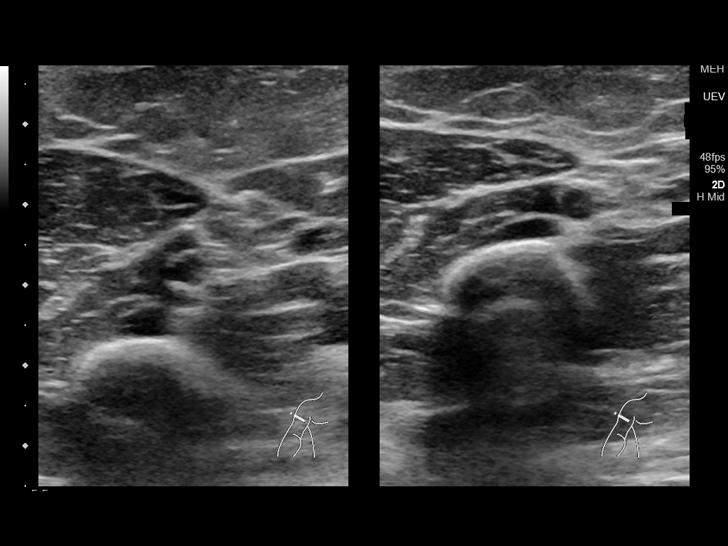
[im 30/37]
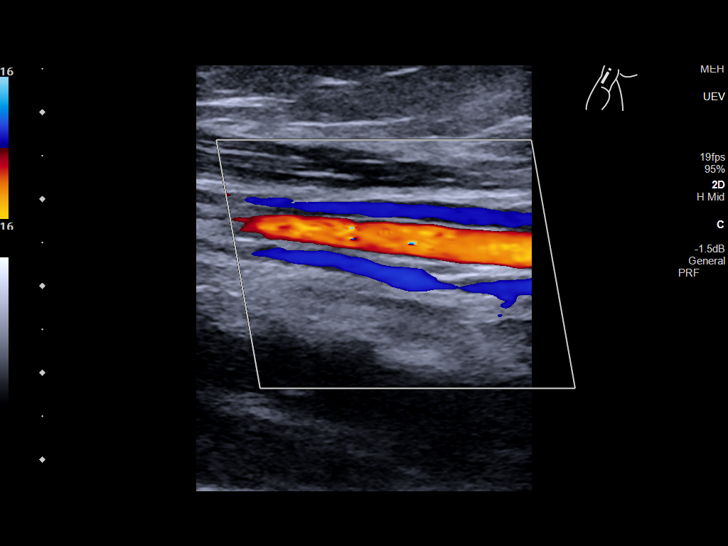
[im 33/37]
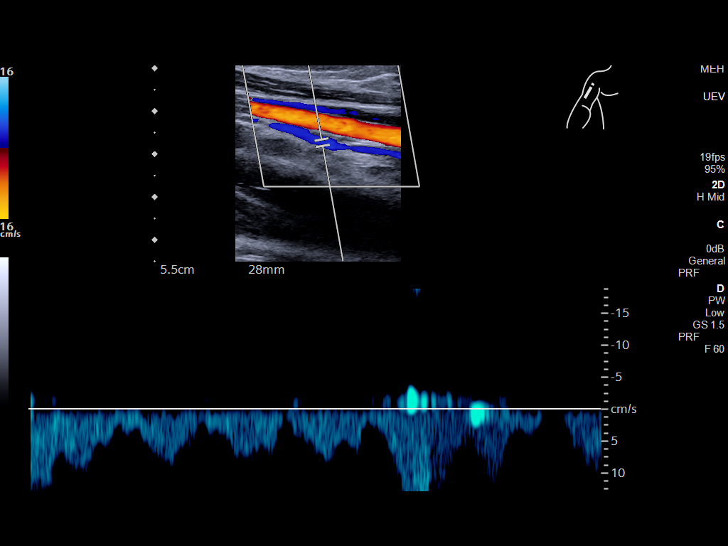
[im 37/37]
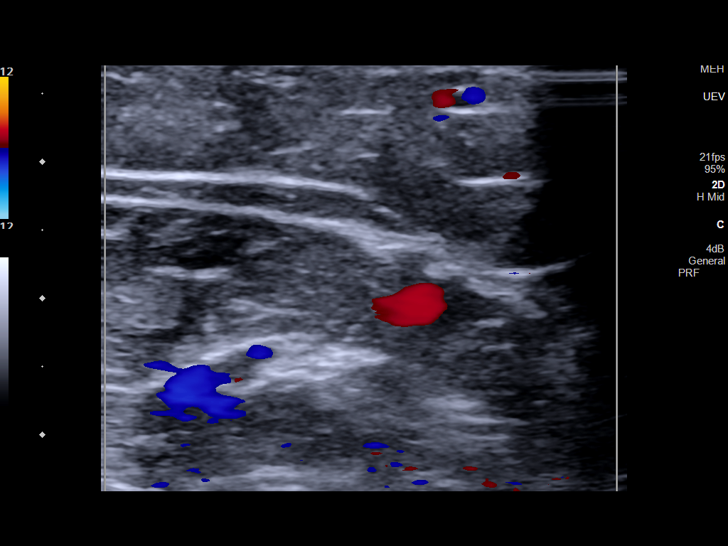

[13 of 24 positions shown; findings below may reference images not displayed]

FINDINGS: Contralateral Subclavian Vein: Respiratory phasicity is normal and
symmetric with the symptomatic side. No evidence of thrombus. Normal
compressibility.

Internal Jugular Vein: No evidence of thrombus. Normal
compressibility, respiratory phasicity and response to augmentation.

Subclavian Vein: No evidence of thrombus. Normal compressibility,
respiratory phasicity and response to augmentation.

Axillary Vein: No evidence of thrombus. Normal compressibility,
respiratory phasicity and response to augmentation.

Cephalic Vein: No evidence of thrombus. Normal compressibility,
respiratory phasicity and response to augmentation.

Basilic Vein: No evidence of thrombus. Normal compressibility,
respiratory phasicity and response to augmentation.

Brachial Veins: No evidence of thrombus. Normal compressibility,
respiratory phasicity and response to augmentation.

Radial Veins: No evidence of thrombus. Normal compressibility,
respiratory phasicity and response to augmentation.

Ulnar Veins: No evidence of thrombus. Normal compressibility,
respiratory phasicity and response to augmentation.

Venous Reflux:  None visualized.

Other Findings: 5 mm lymph node with fatty hilum is noted near
axillary region which most likely is reactive in etiology.
IMPRESSION: No evidence of DVT within the right upper extremity.

## 2022-04-15 IMAGING — US US EXTREM  UP VENOUS*R*
1 series · 13 of 24 positions shown · non-contrast
Comparison: None.

CLINICAL DATA: Swelling



[Series 1: us venous img upper uni right (dvt) · portal-venous · 13 of 34 slices shown]
[im 1/34]
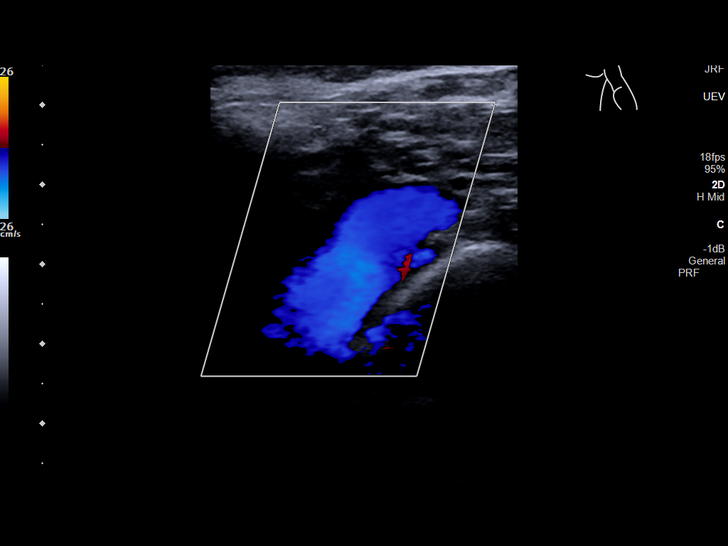
[im 3/34]
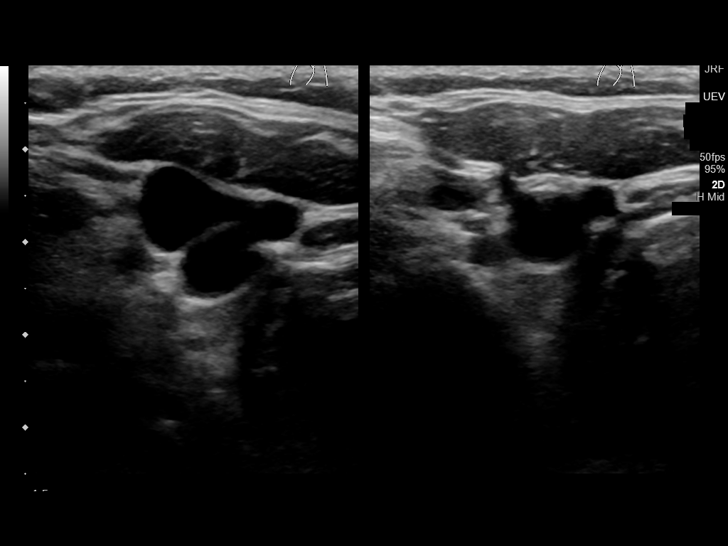
[im 6/34]
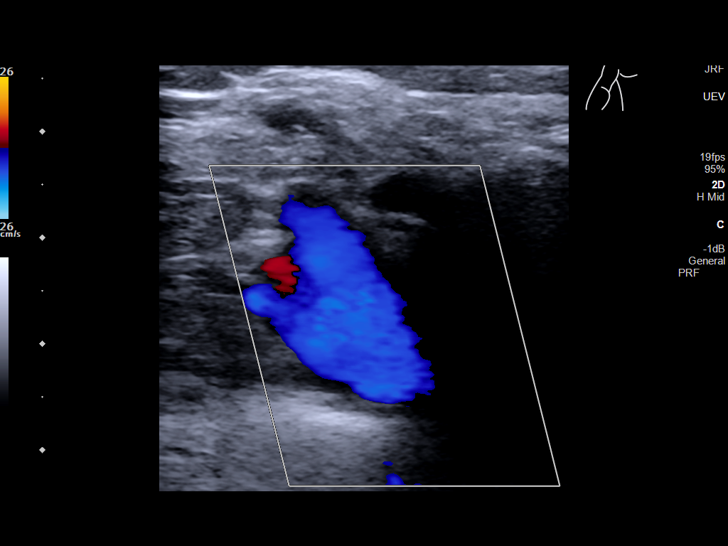
[im 9/34]
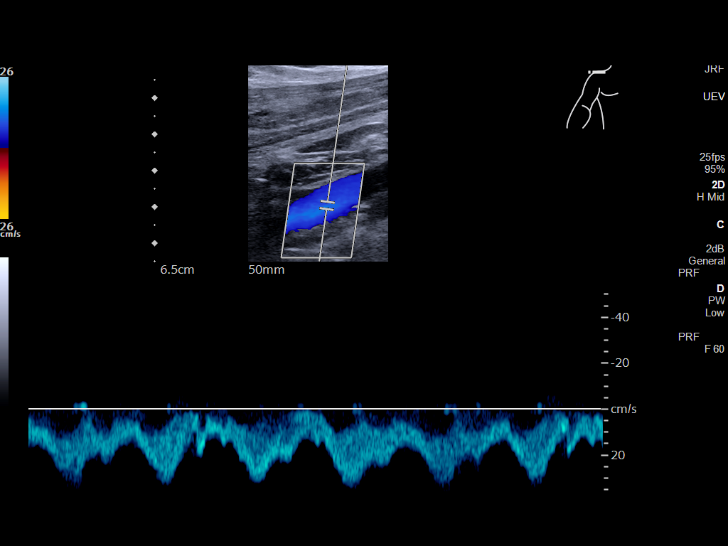
[im 12/34]
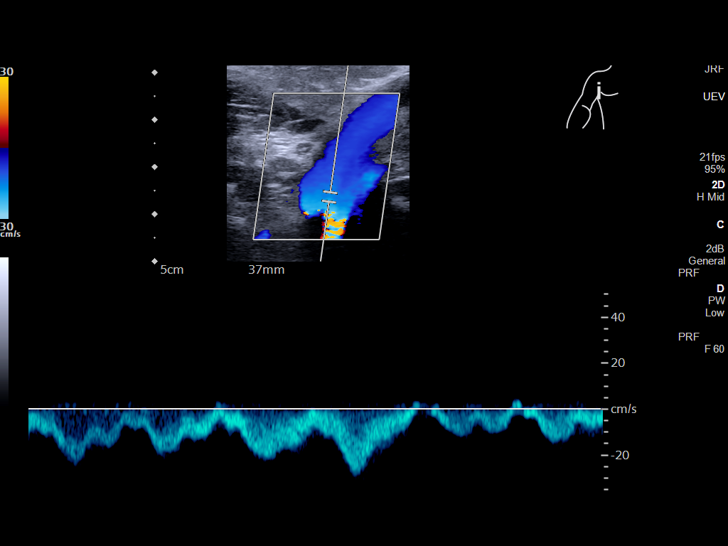
[im 15/34]
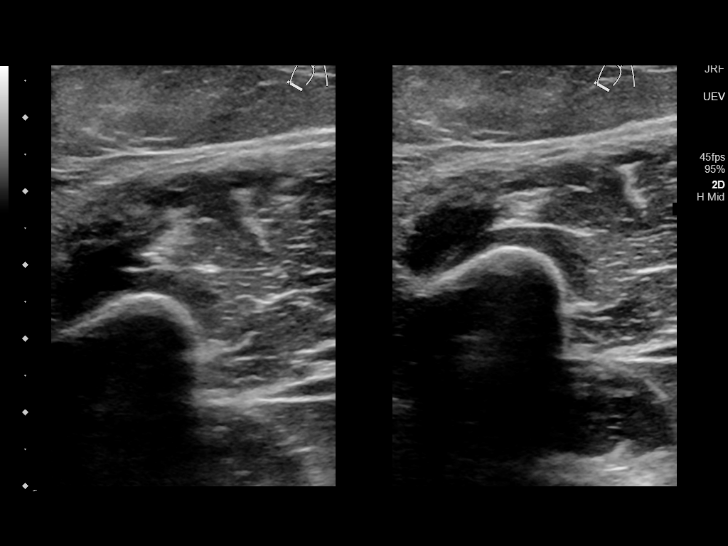
[im 18/34]
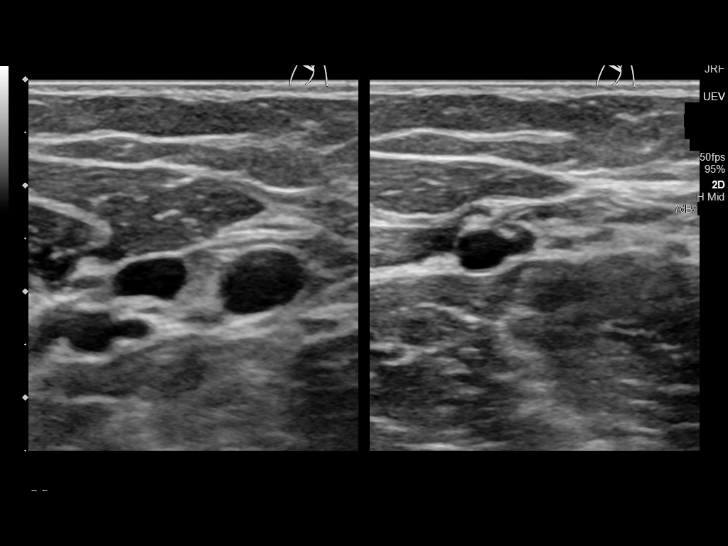
[im 19/34]
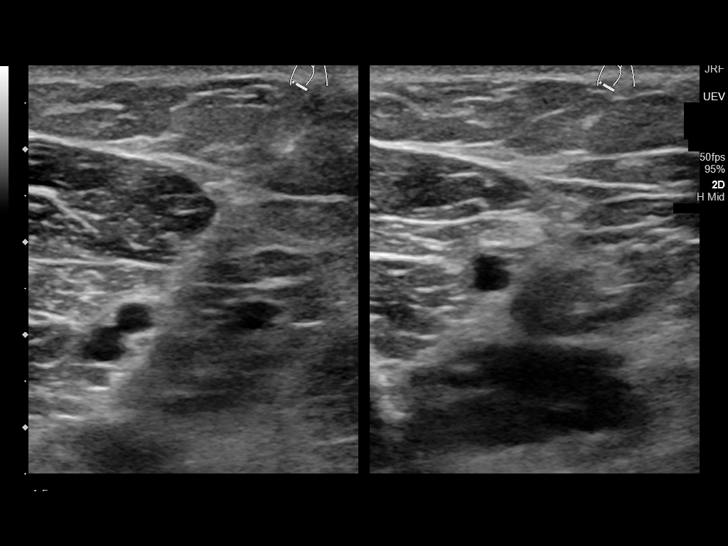
[im 22/34]
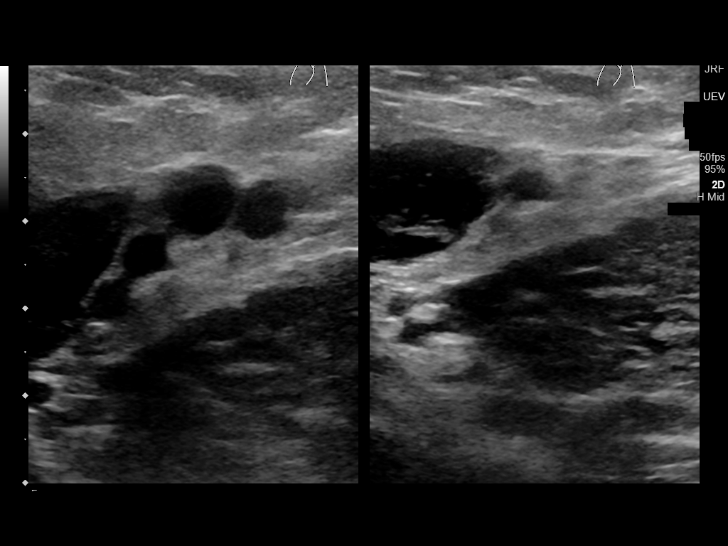
[im 25/34]
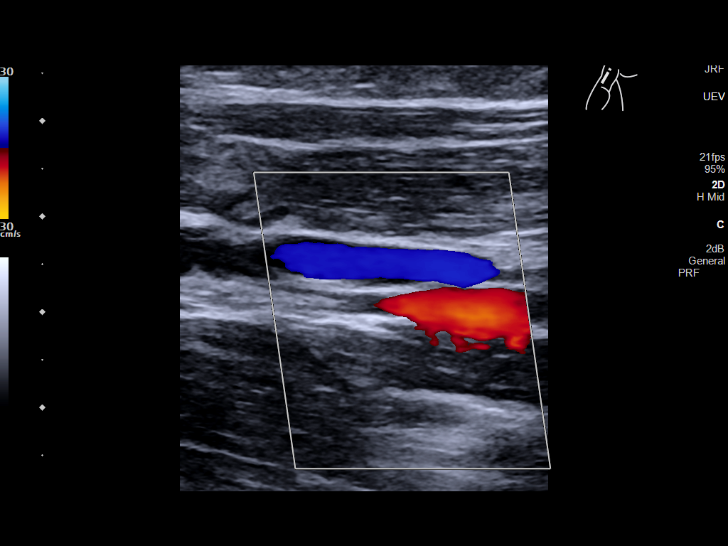
[im 28/34]
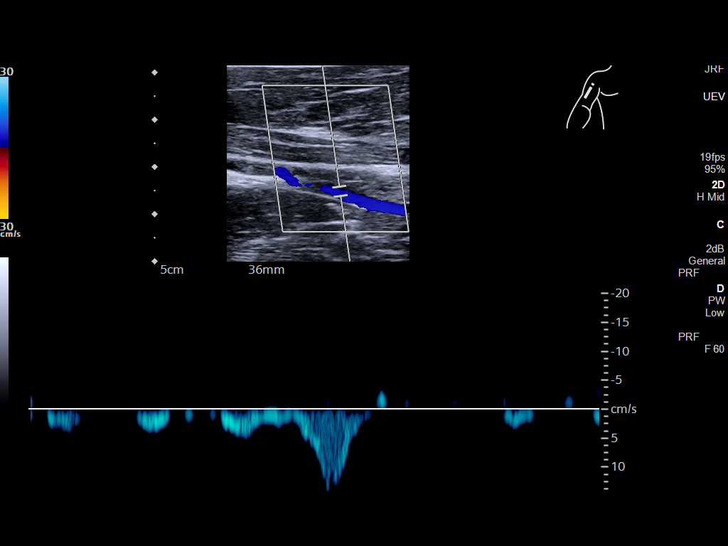
[im 31/34]
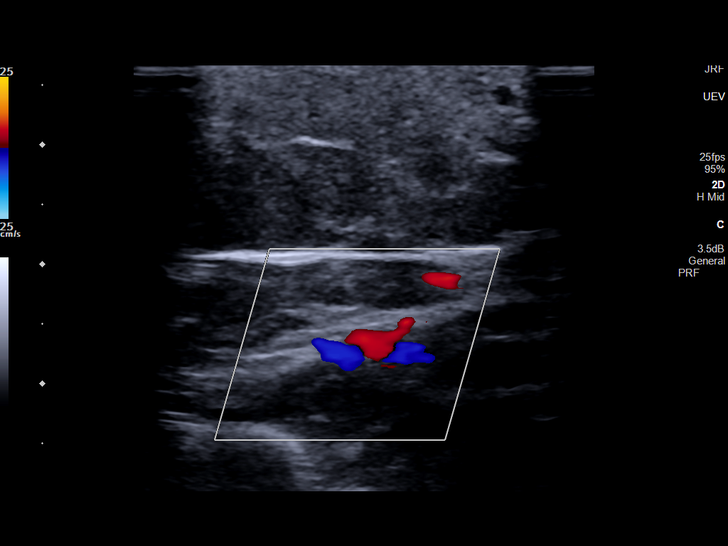
[im 34/34]
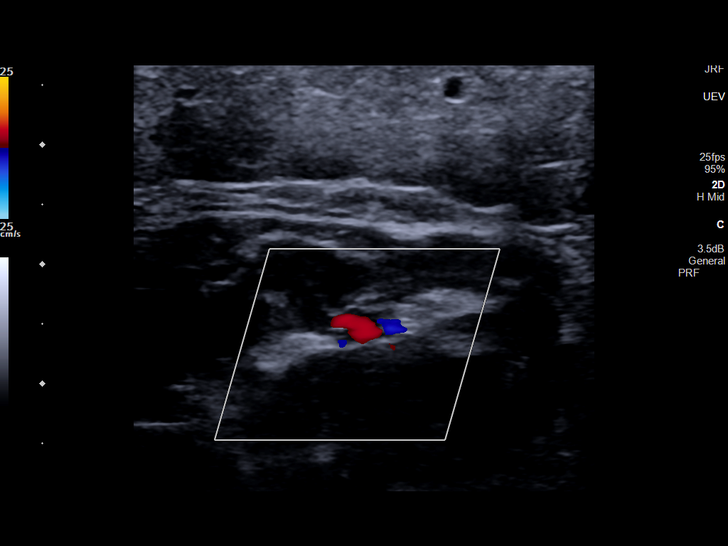

[13 of 24 positions shown; findings below may reference images not displayed]

FINDINGS: Contralateral Subclavian Vein: Respiratory phasicity is normal and
symmetric with the symptomatic side. No evidence of thrombus. Normal
compressibility.

Internal Jugular Vein: No evidence of thrombus. Normal
compressibility, respiratory phasicity and response to augmentation.

Subclavian Vein: No evidence of thrombus. Normal compressibility,
respiratory phasicity and response to augmentation.

Axillary Vein: No evidence of thrombus. Normal compressibility,
respiratory phasicity and response to augmentation.

Cephalic Vein: Not visualized

Basilic Vein: No evidence of thrombus. Normal compressibility,
respiratory phasicity and response to augmentation.

Brachial Veins: No evidence of thrombus. Normal compressibility,
respiratory phasicity and response to augmentation.

Radial Veins: No evidence of thrombus. Normal compressibility,
respiratory phasicity and response to augmentation.

Ulnar Veins: No evidence of thrombus. Normal compressibility,
respiratory phasicity and response to augmentation.

Venous Reflux:  None visualized.

Other Findings:  None visualized.
IMPRESSION: No evidence of DVT within the right upper extremity.

## 2022-04-25 ENCOUNTER — Encounter: Payer: Self-pay | Admitting: Occupational Therapy

## 2022-04-25 ENCOUNTER — Ambulatory Visit: Payer: Managed Care, Other (non HMO) | Attending: Internal Medicine | Admitting: Occupational Therapy

## 2022-04-25 DIAGNOSIS — I972 Postmastectomy lymphedema syndrome: Secondary | ICD-10-CM | POA: Diagnosis present

## 2022-04-25 NOTE — Therapy (Signed)
[facility] [address], [place], [address] Phone: [phone]   Fax:  [phone]  Occupational Therapy Evaluation  Patient Details  Name: Sue Wong MRN: [id number] Date of Birth: [birth year] Referring Provider (OT): DR Barrie Folk Date: 04/25/2022   OT End of Session - 04/25/22 1849     Visit Number 1    Number of Visits 12    Date for OT Re-Evaluation 06/06/22    OT Start Time 1430    OT Stop Time 1520    OT Time Calculation (min) 50 min    Activity Tolerance Patient tolerated treatment well    Behavior During Therapy [facility] for tasks assessed/performed             Past Medical History:  Diagnosis Date   Arthritis    Breast cancer ([place])    GERD (gastroesophageal reflux disease)    Psoriasis    [place] spotted fever     Past Surgical History:  Procedure Laterality Date   BREAST SURGERY Bilateral 1/9 and 12/27/16    There were no vitals filed for this visit.   Subjective Assessment - 04/25/22 1842     Subjective  I seen you 2018 - I have to say since then I took care of my dad that past away Jan, and mother in law past away March - I did not take care of myself and gained 20 lbs, went thru menopause - now I can take care of myself and my arm - did had infection ones or twice    Pertinent History Sue Wong is seen for annual exam and history of right-sided breast cancer with metastasis to the axillary node, status post bilateral mastectomy with radiation, chemotherapy. Followed by oncology; on antiestrogen therapy through that office. Residual right arm lymphedema, and she is interested in physical therapy for this. She has really struggled with her weight; she had a lot of social stressors which have contributed, but is interested in trying medication to see if she can bring this down and ask questions about what she might be able to use. Has history of scalp psoriasis,  this has been a little bit more of an issue recently. She also has some changes on her abdomen postradiation therapy and wonders if the dermatologist could help her with this. She has had trouble with infections in the right arm affected by lymphedema; she tries to get Keflex on hand for this and is out. She describes disturbed sleep, not restful, poor concentration/memory issues. . She has not been back to gynecology. She has not had a colonoscopy, but does not feel like she would be able to have this done currently due to other time constraints. She has had some allergy symptoms increasing as well, some questions about what to do for this. She has Flonase but has not been using it consistently. Refer to OT    Patient Stated Goals I want my arm smaller - everybody ask me about my arm -and do not want infection again    Currently in Pain? No/denies               [facility] OT Assessment - 04/25/22 0001       Assessment   Medical Diagnosis R UE lymphedema    Referring Provider (OT) DR Caryl Comes    Onset Date/Surgical Date --   2018   Hand Dominance Right    Prior Therapy 2018 had lymphdema tx  Home  Environment   Lives With Spouse      Prior Function   Vocation --   did work prior to [employer], garden, house work              LYMPHEDEMA/ONCOLOGY QUESTIONNAIRE - 04/25/22 0001       Right Upper Extremity Lymphedema   15 cm Proximal to Olecranon Process 38.3 cm    10 cm Proximal to Olecranon Process 38 cm    Olecranon Process 33.6 cm    15 cm Proximal to Ulnar Styloid Process 33 cm    10 cm Proximal to Ulnar Styloid Process 30 cm    Just Proximal to Ulnar Styloid Process 22 cm    Across Hand at [employer] 20.5 cm    At [place] of 2nd Digit 7.2 cm    At [facility] of [facility]umb 6.2 cm      Left Upper Extremity Lymphedema   15 cm Proximal to Olecranon Process 37 cm    10 cm Proximal to Olecranon Process 35.5 cm    Olecranon Process 31 cm    15 cm Proximal to Ulnar  Styloid Process 29.6 cm    10 cm Proximal to Ulnar Styloid Process 26.4 cm    Just Proximal to Ulnar Styloid Process 21 cm    Across Hand at [employer] 19 cm    At [place] of 2nd Digit 6.3 cm    At [facility] of Thumb 6.3 cm              Eucerin apply , stockinette, compression glove  Rosidal forearm hand to upper arm Short stretch  6 cm wrist, hand to wrist , 3 laps Wrist ,hand to elbow 8cm  10 cm from middle forearm to upper arm  Keep 48hrs - will remeasure- and do AROM for wrist, hand , elbow and shoulder 3 x day while in bandaging                OT Education - 04/25/22 1848     Education provided Yes    Education Details findings of eval and HEP- bandaging plan - precautions    Person(s) Educated Patient    Methods Explanation;Demonstration;Tactile cues;Verbal cues;Handout    Comprehension Verbal cues required;Returned demonstration;Verbalized understanding              OT Short Term Goals - 10/05/17 1714       OT SHORT TERM GOAL #1   Title Pt R shoulder AROM improve with at least 30 degrees  for pt to do hair, pull shirt over head and reach over head in cabinet    Status Achieved      OT SHORT TERM GOAL #2   Title L shoulder AROM improve to WNL to reach over head , use in bathing and dressing without increase symptoms     Status Achieved               OT Long Term Goals - 04/25/22 1857       OT LONG TERM GOAL #1   Title Pt to decrease circumference of R UE more than 1 cm in upper arm and elbow and 2 cm at forearm to be measure for compression garments    Baseline R UE increase 2.5 cm in upper arm, 2.6 cm elbow and forearm 4.4 cm - pump not fitted- lost her night compressoin - day time old    Time 3    Period  Weeks    Status New    Target Date 05/16/22      OT LONG TERM GOAL #2   Title Pt to be independent in wearing correct compression garmtns and use pump to maintain her R UE circumferecne to prevent infection in future    Baseline had  few infections the last few yrs -and compresion day time -old and thin -lost her night time compression garment    Time 6    Period Weeks    Status New    Target Date 06/06/22                   Plan - 04/25/22 1850     Clinical Impression Statement Sue Wong this date return for OT eval with history of right-sided breast cancer with metastasis to the axillary node, status post bilateral mastectomy with radiation, chemotherapy. She has R UE lymphedema and was seen for tx by this OT in 2018 - she return today after taking care of aging parents that 2 past away the last few months. She went thru [place]xe and gain about 20lbs since seen in 2018. She did use some her pump but feel it do not fit correctly and did replace some of her compression but thinner sleeves. This date pt R UE lymphedema circumference is increase more than 2.5 cm in upper arm, elbow 2 cm  and forearm 1.9 cm - she do report she also has since 2018 had few infections in R UE. Pt can benefit from skilled OT services decrease circumference to as close as L UE, fit with correct compression day time and night time compression garments.Will contact [employer] to contact pt about recallibrating her pump.  Pt was bandage today with stockinett, glove , rosidal foam and 3 layer short stretch bandage- from hand to upper arm - pt ed on HEP for ROM while in bandaging.    OT Occupational Profile and History Problem Focused Assessment - Including review of records relating to presenting problem    Occupational performance deficits (Please refer to evaluation for details): ADL's;IADL's;Leisure;Play;Social Participation    Body Structure / Function / Physical Skills ADL;Edema;UE functional use;IADL    Rehab Potential Good    Clinical Decision Making Limited treatment options, no task modification necessary    Comorbidities Affecting Occupational Performance: May have comorbidities impacting occupational performance     Modification or Assistance to Complete Evaluation  No modification of tasks or assist necessary to complete eval    OT Frequency 3x / week    OT Duration 6 weeks    OT Treatment/Interventions Self-care/ADL training;Manual lymph drainage;Compression bandaging;Therapeutic exercise;Manual Therapy;Patient/family education    Consulted and Agree with Plan of Care Patient             Patient will benefit from skilled therapeutic intervention in order to improve the following deficits and impairments:   Body Structure / Function / Physical Skills: ADL, Edema, UE functional use, IADL       Visit Diagnosis: Postmastectomy lymphedema syndrome - Plan: Ot plan of care cert/re-cert    Problem List Patient Active Problem List   Diagnosis Date Noted   Cellulitis 06/04/2020   Fever 06/04/2020   Lymphedema 06/04/2020    Sue Wong, OTR/L,CLT 04/25/2022, 7:02 PM  [facility] [address], [place], [address] Phone: [phone]   Fax:  [phone]  Name: Sue Wong MRN: [id number] Date of Birth: [birth year]

## 2022-04-27 ENCOUNTER — Ambulatory Visit: Payer: Managed Care, Other (non HMO) | Admitting: Occupational Therapy

## 2022-04-27 DIAGNOSIS — I972 Postmastectomy lymphedema syndrome: Secondary | ICD-10-CM

## 2022-04-27 NOTE — Therapy (Signed)
[facility] [address] [place], [address] Phone: [phone]   Fax:  [phone]  Occupational Therapy Treatment  Patient Details  Name: Sue Wong MRN: [id number] Date of Birth: 1966/06/18 Referring Provider (OT): DR Barrie Folk Date: 04/27/2022   OT End of Session - 04/27/22 1506     Visit Number 2    Number of Visits 12    Date for OT Re-Evaluation 06/06/22    OT Start Time 1433    OT Stop Time 1506    OT Time Calculation (min) 33 min    Activity Tolerance Patient tolerated treatment well    Behavior During Therapy Sue Wong for tasks assessed/performed             Past Medical History:  Diagnosis Date   Arthritis    Breast cancer ([place])    GERD (gastroesophageal reflux disease)    Psoriasis    [place]y [place] spotted fever     Past Surgical History:  Procedure Laterality Date   BREAST SURGERY Bilateral 1/9 and 12/27/16    There were no vitals filed for this visit.   Subjective Assessment - 04/27/22 1431     Subjective  I did okay with the bandage so I know it looks like I redone it but I just slid down since maybe last night or today.  Can use my hand like when he removed some furniture working the yard my husband is not here    Pertinent History Sue Wong is seen for annual exam and history of right-sided breast cancer with metastasis to the axillary node, status post bilateral mastectomy with radiation, chemotherapy. Followed by oncology; on antiestrogen therapy through that office. Residual right arm lymphedema, and she is interested in physical therapy for this. She has really struggled with her weight; she had a lot of social stressors which have contributed, but is interested in trying medication to see if she can bring this down and ask questions about what she might be able to use. Has history of scalp psoriasis, this has been a little bit more of an issue recently. She  also has some changes on her abdomen postradiation therapy and wonders if the dermatologist could help her with this. She has had trouble with infections in the right arm affected by lymphedema; she tries to get Keflex on hand for this and is out. She describes disturbed sleep, not restful, poor concentration/memory issues. . She has not been back to gynecology. She has not had a colonoscopy, but does not feel like she would be able to have this done currently due to other time constraints. She has had some allergy symptoms increasing as well, some questions about what to do for this. She has Flonase but has not been using it consistently. Refer to OT    Patient Stated Goals I want my arm smaller - everybody ask me about my arm -and do not want infection again    Currently in Pain? No/denies                 LYMPHEDEMA/ONCOLOGY QUESTIONNAIRE - 04/27/22 0001       Right Upper Extremity Lymphedema   15 cm Proximal to Olecranon Process 38 cm    10 cm Proximal to Olecranon Process 37.2 cm    Olecranon Process 33 cm    15 cm Proximal to Ulnar Styloid Process 32.7 cm    10 cm Proximal to Ulnar Styloid Process 29.5  cm    Just Proximal to Ulnar Styloid Process 22.4 cm    Across Hand at [employer] 19.8 cm    At [place] of 2nd Digit 6.8 cm    At [facility] of Thumb 6.5 cm               Patient arrived with bandages on right upper extremity.  But slid down a very loose.  Bandages removed patient is skincare.  See circumference measurements patient did decrease in several areas but less than 1 cm. Apply Eucerin lotion Followed by applying stockinette, clean compression glove  Did apply Ultraflex 10 cm from the wrist 3 times through the hand and up to proximal to elbow Rosidal  hand to upper arm outside of axilla Short stretch  6 cm wrist, hand to forearm, 3 laps through hand Wrist ,hand to elbow 8cm  10 cm from middle forearm to upper arm  All bandages done figure eights this date Keep 48hrs  - will remeasure- and do AROM for wrist, hand , elbow and shoulder 3 x day while in bandaging                OT Education - 04/27/22 1505     Education provided Yes    Education Details Progress and continuing bandages, precautions plan of care    Person(s) Educated Patient    Methods Explanation;Demonstration;Tactile cues;Verbal cues;Handout    Comprehension Verbal cues required;Returned demonstration;Verbalized understanding              OT Short Term Goals - 10/05/17 1714       OT SHORT TERM GOAL #1   Title Pt R shoulder AROM improve with at least 30 degrees  for pt to do hair, pull shirt over head and reach over head in cabinet    Status Achieved      OT SHORT TERM GOAL #2   Title L shoulder AROM improve to WNL to reach over head , use in bathing and dressing without increase symptoms     Status Achieved               OT Long Term Goals - 04/25/22 1857       OT LONG TERM GOAL #1   Title Pt to decrease circumference of R UE more than 1 cm in upper arm and elbow and 2 cm at forearm to be measure for compression garments    Baseline R UE increase 2.5 cm in upper arm, 2.6 cm elbow and forearm 4.4 cm - pump not fitted- lost her night compressoin - day time old    Time 3    Period Weeks    Status New    Target Date 05/16/22      OT LONG TERM GOAL #2   Title Pt to be independent in wearing correct compression garmtns and use pump to maintain her R UE circumferecne to prevent infection in future    Baseline had few infections the last few yrs -and compresion day time -old and thin -lost her night time compression garment    Time 6    Period Weeks    Status New    Target Date 06/06/22                   Plan - 04/27/22 1507     Clinical Impression Statement Sue Wong this date return for OT eval with history of right-sided breast cancer with metastasis to the axillary node, status post bilateral  mastectomy with radiation, chemotherapy.  She has R UE lymphedema and was seen for tx by this OT in 2018 - she return this week after taking care of aging parents that 2 past away the last few months. She went thru [place] and gain about 20lbs since seen in 2018. She did use some her pump but feel it do not fit correctly and did replace some of her compression but thinner sleeves.  This OT did email [employer] reps to contact patient about refilling her pump.  Patient arrived this day with bandages in place but slid down did decongest arm at several areas from her wrist to upper arm but less than 1 cm.  Rebandage right upper extremity this date for added artery flex for wrist hand and forearm under Rosidal foam.  Patient to continue with range of motion and use of right upper extremity with bandages on.. Pt can benefit from skilled OT services decrease circumference to as close as L UE, fit with correct compression day time and night time compression garments..    OT Occupational Profile and History Problem Focused Assessment - Including review of records relating to presenting problem    Occupational performance deficits (Please refer to evaluation for details): ADL's;IADL's;Leisure;Play;Social Participation    Body Structure / Function / Physical Skills ADL;Edema;UE functional use;IADL    Rehab Potential Good    Clinical Decision Making Limited treatment options, no task modification necessary    Comorbidities Affecting Occupational Performance: May have comorbidities impacting occupational performance    Modification or Assistance to Complete Evaluation  No modification of tasks or assist necessary to complete eval    OT Frequency 3x / week    OT Duration 6 weeks    OT Treatment/Interventions Self-care/ADL training;Manual lymph drainage;Compression bandaging;Therapeutic exercise;Manual Therapy;Patient/family education    Consulted and Agree with Plan of Care Patient             Patient will benefit from skilled therapeutic  intervention in order to improve the following deficits and impairments:   Body Structure / Function / Physical Skills: ADL, Edema, UE functional use, IADL       Visit Diagnosis: Postmastectomy lymphedema syndrome    Problem List Patient Active Problem List   Diagnosis Date Noted   Cellulitis 06/04/2020   Fever 06/04/2020   Lymphedema 06/04/2020    Rosalyn Gess, OTR/L,CLT 04/27/2022, 3:10 PM  [facility] [address] [place], [address] Phone: [phone]   Fax:  [phone]  Name: Sue Wong MRN: [id number] Date of Birth: [birth year]

## 2022-04-29 ENCOUNTER — Ambulatory Visit: Payer: Managed Care, Other (non HMO) | Admitting: Occupational Therapy

## 2022-04-29 DIAGNOSIS — I972 Postmastectomy lymphedema syndrome: Secondary | ICD-10-CM | POA: Diagnosis not present

## 2022-04-29 NOTE — Therapy (Signed)
[facility] [address], [place], [address] Phone: [phone]   Fax:  [phone]  Occupational Therapy Treatment  Patient Details  Name: Sue Wong MRN: [id number] Date of Birth: 1966-11-03 Referring Provider (OT): DR Barrie Folk Date: 04/29/2022   OT End of Session - 04/29/22 0959     Visit Number 3    Number of Visits 12    Date for OT Re-Evaluation 06/06/22    OT Start Time 1000    OT Stop Time 1033    OT Time Calculation (min) 33 min    Activity Tolerance Patient tolerated treatment well    Behavior During Therapy [facility] for tasks assessed/performed             Past Medical History:  Diagnosis Date   Arthritis    Breast cancer ([place])    GERD (gastroesophageal reflux disease)    Psoriasis    [place] spotted fever     Past Surgical History:  Procedure Laterality Date   BREAST SURGERY Bilateral 1/9 and 12/27/16    There were no vitals filed for this visit.   Subjective Assessment - 04/29/22 0959     Subjective  My bandages slid down again my glove to to the fingertips I do know from just doing too much sure what but.    Pertinent History Zaidy Absher is seen for annual exam and history of right-sided breast cancer with metastasis to the axillary node, status post bilateral mastectomy with radiation, chemotherapy. Followed by oncology; on antiestrogen therapy through that office. Residual right arm lymphedema, and she is interested in physical therapy for this. She has really struggled with her weight; she had a lot of social stressors which have contributed, but is interested in trying medication to see if she can bring this down and ask questions about what she might be able to use. Has history of scalp psoriasis, this has been a little bit more of an issue recently. She also has some changes on her abdomen postradiation therapy and wonders if the dermatologist could help  her with this. She has had trouble with infections in the right arm affected by lymphedema; she tries to get Keflex on hand for this and is out. She describes disturbed sleep, not restful, poor concentration/memory issues. . She has not been back to gynecology. She has not had a colonoscopy, but does not feel like she would be able to have this done currently due to other time constraints. She has had some allergy symptoms increasing as well, some questions about what to do for this. She has Flonase but has not been using it consistently. Refer to OT    Patient Stated Goals I want my arm smaller - everybody ask me about my arm -and do not want infection again    Currently in Pain? No/denies                 LYMPHEDEMA/ONCOLOGY QUESTIONNAIRE - 04/29/22 0001       Right Upper Extremity Lymphedema   15 cm Proximal to Olecranon Process 38 cm    10 cm Proximal to Olecranon Process 36.5 cm    Olecranon Process 33 cm    15 cm Proximal to Ulnar Styloid Process 32 cm    10 cm Proximal to Ulnar Styloid Process 29.5 cm    Just Proximal to Ulnar Styloid Process 22 cm    Across Hand at [employer] 20.4 cm  Patient arrived with bandages on right upper extremity.  But slid down again and got loose.  Bandages removed patient done skincare.  See circumference measurements patient did decrease in several areas but less than 1 cm again Apply Eucerin lotion and hydrocortisone ointment on volar elbow Followed by applying stockinette, clean compression glove  Did apply comprex foam to dorsal hand to build up to decrease sliding in combination  Artiflex 10 cm from the wrist 3 times through the hand and  over thumb and up to proximal elbow Rosidal  hand to upper arm to axilla Short stretch  6 cm wrist <> hand to forearm, 3 laps through hand figure eights  8 cm Wrist <> hand to  elbow figure eights 10 cm from middle forearm to upper arm   Patient to keep bandages on this weekend but is  also long weekend holiday patient aware of precautions and instructions if she needs to redo the bandages to maintain compression and progress gained.    COnt with AROM for wrist, hand , elbow and shoulder 3 x day while in bandaging                 OT Education - 04/29/22 0959     Education provided Yes    Education Details Progress and continuing bandages, precautions plan of care    Person(s) Educated Patient    Methods Explanation;Demonstration;Tactile cues;Verbal cues;Handout    Comprehension Verbal cues required;Returned demonstration;Verbalized understanding              OT Short Term Goals - 10/05/17 1714       OT SHORT TERM GOAL #1   Title Pt R shoulder AROM improve with at least 30 degrees  for pt to do hair, pull shirt over head and reach over head in cabinet    Status Achieved      OT SHORT TERM GOAL #2   Title L shoulder AROM improve to WNL to reach over head , use in bathing and dressing without increase symptoms     Status Achieved               OT Long Term Goals - 04/25/22 1857       OT LONG TERM GOAL #1   Title Pt to decrease circumference of R UE more than 1 cm in upper arm and elbow and 2 cm at forearm to be measure for compression garments    Baseline R UE increase 2.5 cm in upper arm, 2.6 cm elbow and forearm 4.4 cm - pump not fitted- lost her night compressoin - day time old    Time 3    Period Weeks    Status New    Target Date 05/16/22      OT LONG TERM GOAL #2   Title Pt to be independent in wearing correct compression garmtns and use pump to maintain her R UE circumferecne to prevent infection in future    Baseline had few infections the last few yrs -and compresion day time -old and thin -lost her night time compression garment    Time 6    Period Weeks    Status New    Target Date 06/06/22                   Plan - 04/29/22 0959     Clinical Impression Statement Sue Wong this date return for OT eval  with history of right-sided breast cancer with metastasis to the axillary node, status  post bilateral mastectomy with radiation, chemotherapy. She has R UE lymphedema and was seen for tx by this OT in 2018 - she return this week after taking care of aging parents that 2 past away the last few months. She went thru [place]op[place]xe and gain about 20lbs since seen in 2018. She did use some her pump but feel it do not fit correctly and did replace some of her compression but thinner sleeves.  Patient reports this date that [employer] rep did get in contact with her and will get back out with her to refit pump and sleeves.  Patient arrived this day again with bandages in place but slid down-right upper extremity continue to decongest as several areas but less than what OT will expect. Rebandage right upper extremity this date but at comprex foam on dorsal hand in combination with Artiflex at wrist/ hand and forearm under Rosidal foam to try and decrease sliding of bandages.  Patient to continue with range of motion and use of right upper extremity with bandages on.. Pt can benefit from skilled OT services decrease circumference to as close as L UE, fit with correct compression day time and night time compression garments..    OT Occupational Profile and History Problem Focused Assessment - Including review of records relating to presenting problem    Occupational performance deficits (Please refer to evaluation for details): ADL's;IADL's;Leisure;Play;Social Participation    Body Structure / Function / Physical Skills ADL;Edema;UE functional use;IADL    Rehab Potential Good    Clinical Decision Making Limited treatment options, no task modification necessary    Comorbidities Affecting Occupational Performance: May have comorbidities impacting occupational performance    Modification or Assistance to Complete Evaluation  No modification of tasks or assist necessary to complete eval    OT Frequency 3x / week    OT  Duration 6 weeks    OT Treatment/Interventions Self-care/ADL training;Manual lymph drainage;Compression bandaging;Therapeutic exercise;Manual Therapy;Patient/family education    Consulted and Agree with Plan of Care Patient             Patient will benefit from skilled therapeutic intervention in order to improve the following deficits and impairments:   Body Structure / Function / Physical Skills: ADL, Edema, UE functional use, IADL       Visit Diagnosis: Postmastectomy lymphedema syndrome    Problem List Patient Active Problem List   Diagnosis Date Noted   Cellulitis 06/04/2020   Fever 06/04/2020   Lymphedema 06/04/2020    Rosalyn Gess, OTR/L,CLT 04/29/2022, 11:18 AM  [facility] [address] [place], [address] Phone: [phone]   Fax:  [phone]  Name: Sue Wong MRN: [id number] Date of Birth: 27-Sep-1966

## 2022-05-03 ENCOUNTER — Ambulatory Visit: Payer: Managed Care, Other (non HMO) | Admitting: Occupational Therapy

## 2022-05-04 ENCOUNTER — Ambulatory Visit: Payer: Managed Care, Other (non HMO) | Admitting: Occupational Therapy

## 2022-05-04 DIAGNOSIS — I972 Postmastectomy lymphedema syndrome: Secondary | ICD-10-CM

## 2022-05-04 NOTE — Therapy (Signed)
[facility] [address] [place], [address] Phone: [phone]   Fax:  [phone]  Occupational Therapy Treatment  Patient Details  Name: Sue Wong MRN: [id number] Date of Birth: [birth year] Referring Provider (OT): DR Barrie Folk Date: 05/04/2022   OT End of Session - 05/04/22 1619     Visit Number 4    Number of Visits 12    Date for OT Re-Evaluation 06/06/22    OT Start Time 3785    OT Stop Time 1654    OT Time Calculation (min) 39 min    Activity Tolerance Patient tolerated treatment well    Behavior During Therapy [facility] for tasks assessed/performed             Past Medical History:  Diagnosis Date   Arthritis    Breast cancer ([place])    GERD (gastroesophageal reflux disease)    Psoriasis    [place]y [place] spotted fever     Past Surgical History:  Procedure Laterality Date   BREAST SURGERY Bilateral 1/9 and 12/27/16    There were no vitals filed for this visit.   Subjective Assessment - 05/04/22 1619     Subjective  She was holiday weekend so I did take can read on my bandages the best they could like maybe about 2 times and took a shower before that.  The 1 time he took it off it was really looking good my arm.    Pertinent History Sue Wong is seen for annual exam and history of right-sided breast cancer with metastasis to the axillary node, status post bilateral mastectomy with radiation, chemotherapy. Followed by oncology; on antiestrogen therapy through that office. Residual right arm lymphedema, and she is interested in physical therapy for this. She has really struggled with her weight; she had a lot of social stressors which have contributed, but is interested in trying medication to see if she can bring this down and ask questions about what she might be able to use. Has history of scalp psoriasis, this has been a little bit more of an issue recently. She also has  some changes on her abdomen postradiation therapy and wonders if the dermatologist could help her with this. She has had trouble with infections in the right arm affected by lymphedema; she tries to get Keflex on hand for this and is out. She describes disturbed sleep, not restful, poor concentration/memory issues. . She has not been back to gynecology. She has not had a colonoscopy, but does not feel like she would be able to have this done currently due to other time constraints. She has had some allergy symptoms increasing as well, some questions about what to do for this. She has Flonase but has not been using it consistently. Refer to OT    Patient Stated Goals I want my arm smaller - everybody ask me about my arm -and do not want infection again    Currently in Pain? No/denies                 LYMPHEDEMA/ONCOLOGY QUESTIONNAIRE - 05/04/22 0001       Right Upper Extremity Lymphedema   15 cm Proximal to Olecranon Process 37.5 cm    10 cm Proximal to Olecranon Process 36.5 cm    Olecranon Process 32.5 cm    15 cm Proximal to Ulnar Styloid Process 32.4 cm    10 cm Proximal to Ulnar Styloid Process 29.4 cm  Just Proximal to Ulnar Styloid Process 20.7 cm    Across Hand at [employer] 21 cm                 Patient arrived with bandages on right upper extremity.  Because of long weekend and holiday patient redone her bandages 2 times and to take a shower.  Patient reports she took a shower just before she came in and then put bandages over her right upper extremity for keeping some compression on until she gets to me.    Bandages removed.  See circumference measurements patient did decrease in 3 areas with wrist being more than 1 cm. Apply Eucerin lotion and hydrocortisone ointment on volar elbow Followed by applying stockinette, clean compression glove  Did apply comprex foam to dorsal hand to build up to decrease sliding in combination  In this date did circular complex  foam on forearm with 15 cm Artiflex from elbow to upper arm Short stretch  6 cm wrist <> hand to forearm, 3 laps through hand figure eights  8 cm Wrist <> hand to  elbow figure eights 10 cm from middle forearm to upper arm    Patient to keep bandages on until fully  COnt with AROM for wrist, hand , elbow and shoulder 3 x day while in bandaging                 OT Education - 05/04/22 1619     Education provided Yes    Education Details Progress and continuing bandages, precautions plan of care    Person(s) Educated Patient    Methods Explanation;Demonstration;Tactile cues;Verbal cues;Handout    Comprehension Verbal cues required;Returned demonstration;Verbalized understanding                 OT Long Term Goals - 04/25/22 1857       OT LONG TERM GOAL #1   Title Pt to decrease circumference of R UE more than 1 cm in upper arm and elbow and 2 cm at forearm to be measure for compression garments    Baseline R UE increase 2.5 cm in upper arm, 2.6 cm elbow and forearm 4.4 cm - pump not fitted- lost her night compressoin - day time old    Time 3    Period Weeks    Status New    Target Date 05/16/22      OT LONG TERM GOAL #2   Title Pt to be independent in wearing correct compression garmtns and use pump to maintain her R UE circumferecne to prevent infection in future    Baseline had few infections the last few yrs -and compresion day time -old and thin -lost her night time compression garment    Time 6    Period Weeks    Status New    Target Date 06/06/22                   Plan - 05/04/22 1619     Clinical Impression Statement Sue Wong this date return for OT eval with history of right-sided breast cancer with metastasis to the axillary node, status post bilateral mastectomy with radiation, chemotherapy. She has R UE lymphedema and was seen for tx by this OT in 2018 - she 2 weeks ago after taking care of aging parents that past away the last few  months. She went thru menopauxe and gain about 20lbs since seen in 2018. She did use some her pump but feel it do  not fit correctly and did replace some of her compression but thinner sleeves.  Patient reports this date that [employer] rep did get in contact with her and will get back out with her to refit pump and sleeves.  Patient arrived this day again with bandages in place but slid down-right upper extremity continue to decongest as several areas but less than what OT will expect. Rebandage right upper extremity this date but add circular comprex on forearm together with  comprex foam on dorsal hand in combination with Artiflex 15 cm at upper arm and short stretch bandage. Patient to continue with range of motion and use of right upper extremity with bandages on.. Pt can benefit from skilled OT services decrease circumference to as close as L UE, fit with correct compression day time and night time compression garments..    OT Occupational Profile and History Problem Focused Assessment - Including review of records relating to presenting problem    Occupational performance deficits (Please refer to evaluation for details): ADL's;IADL's;Leisure;Play;Social Participation    Body Structure / Function / Physical Skills ADL;Edema;UE functional use;IADL    Rehab Potential Good    Clinical Decision Making Limited treatment options, no task modification necessary    Comorbidities Affecting Occupational Performance: May have comorbidities impacting occupational performance    Modification or Assistance to Complete Evaluation  No modification of tasks or assist necessary to complete eval    OT Frequency 3x / week    OT Duration 6 weeks    OT Treatment/Interventions Self-care/ADL training;Manual lymph drainage;Compression bandaging;Therapeutic exercise;Manual Therapy;Patient/family education    Consulted and Agree with Plan of Care Patient             Patient will benefit from skilled therapeutic  intervention in order to improve the following deficits and impairments:   Body Structure / Function / Physical Skills: ADL, Edema, UE functional use, IADL       Visit Diagnosis: Postmastectomy lymphedema syndrome    Problem List Patient Active Problem List   Diagnosis Date Noted   Cellulitis 06/04/2020   Fever 06/04/2020   Lymphedema 06/04/2020    Rosalyn Gess, OTR/L,CLT 05/04/2022, 4:57 PM  [facility] [address] [place], [address] Phone: [phone]   Fax:  [phone]  Name: JAID QUIRION MRN: [id number] Date of Birth: 07-11-1966

## 2022-05-06 ENCOUNTER — Ambulatory Visit: Payer: Managed Care, Other (non HMO) | Attending: Internal Medicine | Admitting: Occupational Therapy

## 2022-05-06 DIAGNOSIS — I972 Postmastectomy lymphedema syndrome: Secondary | ICD-10-CM | POA: Insufficient documentation

## 2022-05-06 NOTE — Therapy (Signed)
[facility] [address] [place], [address] Phone: [phone]   Fax:  [phone]  Occupational Therapy Treatment  Patient Details  Name: Sue Wong MRN: [id number] Date of Birth: 27-Apr-1966 Referring Provider (OT): DR Barrie Folk Date: 05/06/2022   OT End of Session - 05/06/22 1238     Visit Number 5    Number of Visits 12    Date for OT Re-Evaluation 06/06/22    OT Start Time 0830    OT Stop Time 0908    OT Time Calculation (min) 38 min    Activity Tolerance Patient tolerated treatment well    Behavior During Therapy [facility] for tasks assessed/performed             Past Medical History:  Diagnosis Date   Arthritis    Breast cancer ([place])    GERD (gastroesophageal reflux disease)    Psoriasis    [place] spotted fever     Past Surgical History:  Procedure Laterality Date   BREAST SURGERY Bilateral 1/9 and 12/27/16    There were no vitals filed for this visit.   Subjective Assessment - 05/06/22 1237     Subjective  It slid down again I do not know if it is  me -  I did a lot of trying on clothes the day and I also mowed like 7 acres.    Pertinent History Sue Wong is seen for annual exam and history of right-sided breast cancer with metastasis to the axillary node, status post bilateral mastectomy with radiation, chemotherapy. Followed by oncology; on antiestrogen therapy through that office. Residual right arm lymphedema, and she is interested in physical therapy for this. She has really struggled with her weight; she had a lot of social stressors which have contributed, but is interested in trying medication to see if she can bring this down and ask questions about what she might be able to use. Has history of scalp psoriasis, this has been a little bit more of an issue recently. She also has some changes on her abdomen postradiation therapy and wonders if the dermatologist  could help her with this. She has had trouble with infections in the right arm affected by lymphedema; she tries to get Keflex on hand for this and is out. She describes disturbed sleep, not restful, poor concentration/memory issues. . She has not been back to gynecology. She has not had a colonoscopy, but does not feel like she would be able to have this done currently due to other time constraints. She has had some allergy symptoms increasing as well, some questions about what to do for this. She has Flonase but has not been using it consistently. Refer to OT    Patient Stated Goals I want my arm smaller - everybody ask me about my arm -and do not want infection again    Currently in Pain? No/denies                 LYMPHEDEMA/ONCOLOGY QUESTIONNAIRE - 05/06/22 0001       Right Upper Extremity Lymphedema   15 cm Proximal to Olecranon Process 37.5 cm    10 cm Proximal to Olecranon Process 37 cm    Olecranon Process 32 cm    15 cm Proximal to Ulnar Styloid Process 32.5 cm    10 cm Proximal to Ulnar Styloid Process 29.4 cm    Just Proximal to Ulnar Styloid Process 21 cm  Across Hand at [employer] 20 cm              Patient arrived with bandages on right upper extremity.  Bandages slid down again over fingers and down upper arm to proximal elbow.  Bandages removed.  See circumference measurements  Patient is skincare  Apply Eucerin lotion and hydrocortisone ointment on volar elbow Kinesiotape with 3 fingers and anchor at elbow on dorsal forearm.  On lateral upper arm 3 fingers with anchor as shoulder. Followed by applying stockinette, clean compression glove  Did apply comprex foam to dorsal hand to build up to decrease sliding in combination  As well as circular complex foam on forearm  15 cm Artiflex from elbow to upper arm Short stretch  6 cm wrist <> hand to forearm, 3 laps through hand figure eights  8 cm Wrist <> hand to  elbow figure eights type second rotation on  dorsal hand 10 cm from middle forearm to upper arm  This date due to Tubigrip E Topper over upper arm bandages to see if can decrease sliding   Patient to keep bandages next visit COnt with AROM for wrist, hand , elbow and shoulder 3 x day while in bandaging                OT Education - 05/06/22 1238     Education provided Yes    Education Details Progress and continuing bandages, precautions plan of care    Person(s) Educated Patient    Methods Explanation;Demonstration;Tactile cues;Verbal cues;Handout    Comprehension Verbal cues required;Returned demonstration;Verbalized understanding                 OT Long Term Goals - 04/25/22 1857       OT LONG TERM GOAL #1   Title Pt to decrease circumference of R UE more than 1 cm in upper arm and elbow and 2 cm at forearm to be measure for compression garments    Baseline R UE increase 2.5 cm in upper arm, 2.6 cm elbow and forearm 4.4 cm - pump not fitted- lost her night compressoin - day time old    Time 3    Period Weeks    Status New    Target Date 05/16/22      OT LONG TERM GOAL #2   Title Pt to be independent in wearing correct compression garmtns and use pump to maintain her R UE circumferecne to prevent infection in future    Baseline had few infections the last few yrs -and compresion day time -old and thin -lost her night time compression garment    Time 6    Period Weeks    Status New    Target Date 06/06/22                   Plan - 05/06/22 1239     Clinical Impression Statement Sue Wong this date return for OT eval with history of right-sided breast cancer with metastasis to the axillary node, status post bilateral mastectomy with radiation, chemotherapy. She has R UE lymphedema and was seen for tx by this OT in 2018 - she was taking care of aging parents that past away the last few months. She went thru menopause and gain about 20lbs since seen in 2018. She did use some her pump but  feel it do not fit correctly and did replace some of her compression but thinner sleeves.  Patient reports this date that [employer] rep  did get in contact with her and will get back out with her to refit pump and sleeves.  Patient continue to have issues with preventing bandages from sliding down.--right upper extremity date decongest as several areas but less than what OT will expect.  Patient hand is much smaller than forearm and wrist and even with circular comprex on forearm and comprex foam on dorsal hand in combination with Artiflex 15 cm at upper arm and short stretch bandage it slid down.  It appears this time short stretch bandages slid and not the base.  Applied this date Kinesiotape with 3 fingers to anchor on dorsal forearm and then lateral upper arm.  Also taped 8 cm short stretch at hand.  And applied Tubigrip E Topper over bandages at upper arm.  We will see if changes can keep bandages from sliding down.. Patient to continue with range of motion and use of right upper extremity with bandages on.. Pt can benefit from skilled OT services decrease circumference to as close as L UE, fit with correct compression day time and night time compression garments..    OT Occupational Profile and History Problem Focused Assessment - Including review of records relating to presenting problem    Occupational performance deficits (Please refer to evaluation for details): ADL's;IADL's;Leisure;Play;Social Participation    Body Structure / Function / Physical Skills ADL;Edema;UE functional use;IADL    Rehab Potential Good    Clinical Decision Making Limited treatment options, no task modification necessary    Comorbidities Affecting Occupational Performance: May have comorbidities impacting occupational performance    Modification or Assistance to Complete Evaluation  No modification of tasks or assist necessary to complete eval    OT Frequency 3x / week    OT Duration 6 weeks    OT Treatment/Interventions  Self-care/ADL training;Manual lymph drainage;Compression bandaging;Therapeutic exercise;Manual Therapy;Patient/family education    Consulted and Agree with Plan of Care Patient             Patient will benefit from skilled therapeutic intervention in order to improve the following deficits and impairments:   Body Structure / Function / Physical Skills: ADL, Edema, UE functional use, IADL       Visit Diagnosis: Postmastectomy lymphedema syndrome    Problem List Patient Active Problem List   Diagnosis Date Noted   Cellulitis 06/04/2020   Fever 06/04/2020   Lymphedema 06/04/2020    Rosalyn Gess, OTR/L,CLT 05/06/2022, 12:45 PM  [facility] [address] [place], [address] Phone: [phone]   Fax:  [phone]  Name: Sue Wong MRN: [id number] Date of Birth: October 08, 1966

## 2022-05-09 ENCOUNTER — Ambulatory Visit: Payer: Managed Care, Other (non HMO) | Admitting: Occupational Therapy

## 2022-05-09 DIAGNOSIS — I972 Postmastectomy lymphedema syndrome: Secondary | ICD-10-CM

## 2022-05-09 NOTE — Therapy (Signed)
[facility] [address] [place], [address] Phone: [phone]   Fax:  [phone]  Occupational Therapy Treatment  Patient Details  Name: Sue Wong MRN: [id number] Date of Birth: 25-Jun-1966 Referring Provider (OT): DR Barrie Folk Date: 05/09/2022   OT End of Session - 05/09/22 1244     Visit Number 6    Number of Visits 12    Date for OT Re-Evaluation 06/06/22    OT Start Time 1116    OT Stop Time 1148    OT Time Calculation (min) 32 min    Activity Tolerance Patient tolerated treatment well    Behavior During Therapy [facility] for tasks assessed/performed             Past Medical History:  Diagnosis Date   Arthritis    Breast cancer ([place])    GERD (gastroesophageal reflux disease)    Psoriasis    Rocky [place] spotted fever     Past Surgical History:  Procedure Laterality Date   BREAST SURGERY Bilateral 1/9 and 12/27/16    There were no vitals filed for this visit.   Subjective Assessment - 05/09/22 1244     Subjective  It is tight on the bandages slow better this weekend but I did take it off to take a shower Saturday and this morning also like to get off take a shower and rebandage it.    Pertinent History Sue Wong is seen for annual exam and history of right-sided breast cancer with metastasis to the axillary node, status post bilateral mastectomy with radiation, chemotherapy. Followed by oncology; on antiestrogen therapy through that office. Residual right arm lymphedema, and she is interested in physical therapy for this. She has really struggled with her weight; she had a lot of social stressors which have contributed, but is interested in trying medication to see if she can bring this down and ask questions about what she might be able to use. Has history of scalp psoriasis, this has been a little bit more of an issue recently. She also has some changes on her abdomen  postradiation therapy and wonders if the dermatologist could help her with this. She has had trouble with infections in the right arm affected by lymphedema; she tries to get Keflex on hand for this and is out. She describes disturbed sleep, not restful, poor concentration/memory issues. . She has not been back to gynecology. She has not had a colonoscopy, but does not feel like she would be able to have this done currently due to other time constraints. She has had some allergy symptoms increasing as well, some questions about what to do for this. She has Flonase but has not been using it consistently. Refer to OT    Patient Stated Goals I want my arm smaller - everybody ask me about my arm -and do not want infection again    Currently in Pain? No/denies                 LYMPHEDEMA/ONCOLOGY QUESTIONNAIRE - 05/09/22 0001       Right Upper Extremity Lymphedema   15 cm Proximal to Olecranon Process 37.8 cm    10 cm Proximal to Olecranon Process 37 cm    Olecranon Process 33.3 cm    15 cm Proximal to Ulnar Styloid Process 32.5 cm    10 cm Proximal to Ulnar Styloid Process 29.4 cm    Just Proximal to Ulnar Styloid Process  21.4 cm    Across Hand at [employer] 20.2 cm               Patient arrived with bandages on right upper extremity.  Patient reports she took it off Saturday and then again this morning to shower and rebandaged it because she was out in the yard.  See circumference measurements  Circumferences stayed the same than last week. Patient do report that he did appear that he did not slide down as much as last week. Patient done skincare at home Apply Eucerin lotion and hydrocortisone ointment on volar elbow Followed by applying stockinette, clean compression glove  Did apply circular comprex foam from hand to proximal forearm Rosidal foam from elbow to upper arm. Short stretch  6 cm wrist <> hand to forearm, 3 laps through hand figure eights  8 cm Wrist <> hand to   elbow figure eights type second rotation on dorsal hand 10 cm from middle forearm to upper arm  This date due to Tubigrip E Topper over upper arm bandages to see if can decrease sliding Also provide Tubigrip E for over bandaging when she is out in the garden to see if that will help keep it clean and not sliding.   [place] the [place] the [place] of the week recommended for patient to take her pump to do twice a day and then keep bandages on in between and will reassess on Monday.              OT Education - 05/09/22 1244     Education provided Yes    Education Details Progress and continuing bandages, precautions plan of care    Person(s) Educated Patient    Methods Explanation;Demonstration;Tactile cues;Verbal cues;Handout    Comprehension Verbal cues required;Returned demonstration;Verbalized understanding                 OT Long Term Goals - 04/25/22 1857       OT LONG TERM GOAL #1   Title Pt to decrease circumference of R UE more than 1 cm in upper arm and elbow and 2 cm at forearm to be measure for compression garments    Baseline R UE increase 2.5 cm in upper arm, 2.6 cm elbow and forearm 4.4 cm - pump not fitted- lost her night compressoin - day time old    Time 3    Period Weeks    Status New    Target Date 05/16/22      OT LONG TERM GOAL #2   Title Pt to be independent in wearing correct compression garmtns and use pump to maintain her R UE circumferecne to prevent infection in future    Baseline had few infections the last few yrs -and compresion day time -old and thin -lost her night time compression garment    Time 6    Period Weeks    Status New    Target Date 06/06/22                   Plan - 05/09/22 1245     Clinical Impression Statement Sue Wong this date return for OT eval with history of right-sided breast cancer with metastasis to the axillary node, status post bilateral mastectomy with radiation, chemotherapy. She  has R UE lymphedema and was seen for tx by this OT in 2018 - she was taking care of aging parents that past away the last few months. She went thru menopause  and gain about 20lbs since seen in 2018. She did use some her pump but feel it do not fit correctly and did replace some of her compression but thinner sleeves.  Patient reports  [employer] rep did get in contact with her and will get back out with her to refit pump and sleeves. Plan to do that next week.   Patient continue to have issues with preventing bandages from sliding down.--right upper extremity date decongest as several areas but less than what OT will expect.  Patient hand is much smaller than forearm and wrist and even with circular comprex on forearm and comprex foam on dorsal hand in combination with Artiflex 15 cm at upper arm and short stretch bandage it slid down.  Made some changes to bandages last time- pt report did stay in place little better- but she shower and redone bandags Sat and this am.  Very active out side in yard.  Patient's measurement stable the same than last week.  Patient going out of town on vacation.  Recommended for patient to take her pump do morning and evening with bandaging in between and we will regroup and reassess on Monday. . Patient to continue with range of motion and use of right upper extremity with bandages on.. Pt can benefit from skilled OT services decrease circumference to as close as L UE, fit with correct compression day time and night time compression garments..    OT Occupational Profile and History Problem Focused Assessment - Including review of records relating to presenting problem    Occupational performance deficits (Please refer to evaluation for details): ADL's;IADL's;Leisure;Play;Social Participation    Body Structure / Function / Physical Skills ADL;Edema;UE functional use;IADL    Rehab Potential Good    Clinical Decision Making Limited treatment options, no task modification necessary     Comorbidities Affecting Occupational Performance: May have comorbidities impacting occupational performance    Modification or Assistance to Complete Evaluation  No modification of tasks or assist necessary to complete eval    OT Frequency 3x / week    OT Duration 6 weeks    OT Treatment/Interventions Self-care/ADL training;Manual lymph drainage;Compression bandaging;Therapeutic exercise;Manual Therapy;Patient/family education    Consulted and Agree with Plan of Care Patient             Patient will benefit from skilled therapeutic intervention in order to improve the following deficits and impairments:   Body Structure / Function / Physical Skills: ADL, Edema, UE functional use, IADL       Visit Diagnosis: Postmastectomy lymphedema syndrome    Problem List Patient Active Problem List   Diagnosis Date Noted   Cellulitis 06/04/2020   Fever 06/04/2020   Lymphedema 06/04/2020    Rosalyn Gess, OTR/L,CLT 05/09/2022, 12:49 PM  [facility] [address], [place], [address] Phone: [phone]   Fax:  [phone]  Name: Sue Wong MRN: [id number] Date of Birth: 1965/12/31

## 2022-05-10 ENCOUNTER — Ambulatory Visit: Payer: Managed Care, Other (non HMO) | Admitting: Occupational Therapy

## 2022-05-16 ENCOUNTER — Ambulatory Visit: Payer: Managed Care, Other (non HMO) | Admitting: Occupational Therapy

## 2022-05-16 DIAGNOSIS — I972 Postmastectomy lymphedema syndrome: Secondary | ICD-10-CM | POA: Diagnosis not present

## 2022-05-16 NOTE — Therapy (Signed)
[facility] [address] [place], [address] Phone: [phone]   Fax:  [phone]  Occupational Therapy Treatment  Patient Details  Name: Sue Wong MRN: [id number] Date of Birth: 1966/02/17 Referring Provider (OT): DR Barrie Folk Date: 05/16/2022   OT End of Session - 05/16/22 1746     Visit Number 7    Number of Visits 12    Date for OT Re-Evaluation 06/06/22    OT Start Time 1510    OT Stop Time 1548    OT Time Calculation (min) 38 min    Activity Tolerance Patient tolerated treatment well    Behavior During Therapy [facility] for tasks assessed/performed             Past Medical History:  Diagnosis Date   Arthritis    Breast cancer ([place])    GERD (gastroesophageal reflux disease)    Psoriasis    [place] spotted fever     Past Surgical History:  Procedure Laterality Date   BREAST SURGERY Bilateral 1/9 and 12/27/16    There were no vitals filed for this visit.   Subjective Assessment - 05/16/22 1745     Subjective  I was out of town so I try to use my pump at night and then kept the bandage with like some of that soft fabric in 2-3 bandages.  I think I did okay with just maintaining it    Pertinent History Sue Wong is seen for annual exam and history of right-sided breast cancer with metastasis to the axillary node, status post bilateral mastectomy with radiation, chemotherapy. Followed by oncology; on antiestrogen therapy through that office. Residual right arm lymphedema, and she is interested in physical therapy for this. She has really struggled with her weight; she had a lot of social stressors which have contributed, but is interested in trying medication to see if she can bring this down and ask questions about what she might be able to use. Has history of scalp psoriasis, this has been a little bit more of an issue recently. She also has some changes on her abdomen  postradiation therapy and wonders if the dermatologist could help her with this. She has had trouble with infections in the right arm affected by lymphedema; she tries to get Keflex on hand for this and is out. She describes disturbed sleep, not restful, poor concentration/memory issues. . She has not been back to gynecology. She has not had a colonoscopy, but does not feel like she would be able to have this done currently due to other time constraints. She has had some allergy symptoms increasing as well, some questions about what to do for this. She has Flonase but has not been using it consistently. Refer to OT    Patient Stated Goals I want my arm smaller - everybody ask me about my arm -and do not want infection again    Currently in Pain? No/denies                 LYMPHEDEMA/ONCOLOGY QUESTIONNAIRE - 05/16/22 0001       Right Upper Extremity Lymphedema   15 cm Proximal to Olecranon Process 37.7 cm    10 cm Proximal to Olecranon Process 37.4 cm    Olecranon Process 33 cm    15 cm Proximal to Ulnar Styloid Process 32.6 cm    10 cm Proximal to Ulnar Styloid Process 29 cm    Just Proximal  to Ulnar Styloid Process 21.5 cm    Across Hand at [employer] 20.5 cm                Patient arrived with bandages on right upper extremity.  Pt did at vacation pump at night time and bandage.  See circumference measurements  Circumferences stayed the same than last week. Patient report she has lymphedema in her thoracic under the site and even into her chest and collarbone. Discussed with patient her pump situation again.  Rep from [facility] did contact her but it appears that they only do phone or over the computer consultations. She will look into it otherwise OT will reach out again to [place]. Patient to focus me on pumping thoracic to twice a day and redo her bandaging to see if can decongest if we do pumping twice a day in combination with bandaging.  Patient done skincare  at home Apply Eucerin lotion and hydrocortisone ointment on volar elbow Followed by applying stockinette, clean compression glove  Did apply circular comprex foam from hand to proximal forearm Rosidal foam from elbow to upper arm. Short stretch  6 cm wrist <> hand to forearm, 3 laps through hand figure eights  8 cm Wrist <> hand to  elbow figure eights type second rotation on dorsal hand 10 cm from middle forearm to upper arm  This date due to [place] over upper arm bandages t                 OT Education - 05/16/22 1746     Education provided Yes    Education Details Progress and continuing bandages, precautions plan of care    Person(s) Educated Patient    Methods Explanation;Demonstration;Tactile cues;Verbal cues;Handout    Comprehension Verbal cues required;Returned demonstration;Verbalized understanding                 OT Long Term Goals - 04/25/22 1857       OT LONG TERM GOAL #1   Title Pt to decrease circumference of R UE more than 1 cm in upper arm and elbow and 2 cm at forearm to be measure for compression garments    Baseline R UE increase 2.5 cm in upper arm, 2.6 cm elbow and forearm 4.4 cm - pump not fitted- lost her night compressoin - day time old    Time 3    Period Weeks    Status New    Target Date 05/16/22      OT LONG TERM GOAL #2   Title Pt to be independent in wearing correct compression garmtns and use pump to maintain her R UE circumferecne to prevent infection in future    Baseline had few infections the last few yrs -and compresion day time -old and thin -lost her night time compression garment    Time 6    Period Weeks    Status New    Target Date 06/06/22                   Plan - 05/16/22 1747     Clinical Impression Statement Sue Wong this date return for OT eval with history of right-sided breast cancer with metastasis to the axillary node, status post bilateral mastectomy with radiation,  chemotherapy. She has R UE lymphedema and was seen for tx by this OT in 2018 - she was taking care of aging parents that past away the last few months. She went thru menopause and gain  about 20lbs since seen in 2018. She did use some her pump but feel it do not fit correctly and did replace some of her compression but thinner sleeves.  Patient reports  [employer] rep did get in contact with her and  but her model is old and they do phone or over computer consult.  Pt deongesting slower that OT expect and has trouble keeping bandages from sliding. Pt was out of town for about week. This date change for pt to do bandages 2 x - am and pm and use her pump then too. SHe do report she has lymphedema in her thoracic. Pt to focus on that to with her pumping 2 x day -and wil reach out to Rep about her pump again. Very active out side in yard.  Patient's measurement stable  over the vacation last week. Patient to continue with range of motion and use of right upper extremity with bandages on.. Pt can benefit from skilled OT services decrease circumference to as close as L UE, fit with correct compression day time and night time compression garments..    OT Occupational Profile and History Problem Focused Assessment - Including review of records relating to presenting problem    Occupational performance deficits (Please refer to evaluation for details): ADL's;IADL's;Leisure;Play;Social Participation    Body Structure / Function / Physical Skills ADL;Edema;UE functional use;IADL    Rehab Potential Good    Clinical Decision Making Limited treatment options, no task modification necessary    Comorbidities Affecting Occupational Performance: May have comorbidities impacting occupational performance    Modification or Assistance to Complete Evaluation  No modification of tasks or assist necessary to complete eval    OT Frequency 3x / week    OT Duration 6 weeks    OT Treatment/Interventions Self-care/ADL training;Manual  lymph drainage;Compression bandaging;Therapeutic exercise;Manual Therapy;Patient/family education    Consulted and Agree with Plan of Care Patient             Patient will benefit from skilled therapeutic intervention in order to improve the following deficits and impairments:   Body Structure / Function / Physical Skills: ADL, Edema, UE functional use, IADL       Visit Diagnosis: Postmastectomy lymphedema syndrome    Problem List Patient Active Problem List   Diagnosis Date Noted   Cellulitis 06/04/2020   Fever 06/04/2020   Lymphedema 06/04/2020    Sue Wong, Sue Wong,Sue Wong 05/16/2022, 5:53 PM  [facility] [address] [place], [address] Phone: [phone]   Fax:  [phone]  Name: Sue Wong MRN: [id number] Date of Birth: 1966-05-02

## 2022-05-18 ENCOUNTER — Ambulatory Visit: Payer: Managed Care, Other (non HMO) | Admitting: Occupational Therapy

## 2022-05-18 DIAGNOSIS — I972 Postmastectomy lymphedema syndrome: Secondary | ICD-10-CM

## 2022-05-18 NOTE — Therapy (Signed)
[facility] [address] [place], [address] Phone: [phone]   Fax:  [phone]  Occupational Therapy Treatment  Patient Details  Name: Sue Wong MRN: [id number] Date of Birth: [birth year] Referring Provider (OT): DR Barrie Folk Date: 05/18/2022   OT End of Session - 05/18/22 1607     Visit Number 8    Number of Visits 12    Date for OT Re-Evaluation 06/06/22    OT Start Time 1608    OT Stop Time 1639    OT Time Calculation (min) 31 min    Activity Tolerance Patient tolerated treatment well    Behavior During Therapy [facility] for tasks assessed/performed             Past Medical History:  Diagnosis Date   Arthritis    Breast cancer ([place])    GERD (gastroesophageal reflux disease)    Psoriasis    [place] spotted fever     Past Surgical History:  Procedure Laterality Date   BREAST SURGERY Bilateral 1/9 and 12/27/16    There were no vitals filed for this visit.   Subjective Assessment - 05/18/22 1606     Pertinent History Sue Wong is seen for annual exam and history of right-sided breast cancer with metastasis to the axillary node, status post bilateral mastectomy with radiation, chemotherapy. Followed by oncology; on antiestrogen therapy through that office. Residual right arm lymphedema, and she is interested in physical therapy for this. She has really struggled with her weight; she had a lot of social stressors which have contributed, but is interested in trying medication to see if she can bring this down and ask questions about what she might be able to use. Has history of scalp psoriasis, this has been a little bit more of an issue recently. She also has some changes on her abdomen postradiation therapy and wonders if the dermatologist could help her with this. She has had trouble with infections in the right arm affected by lymphedema; she tries to get Keflex on hand  for this and is out. She describes disturbed sleep, not restful, poor concentration/memory issues. . She has not been back to gynecology. She has not had a colonoscopy, but does not feel like she would be able to have this done currently due to other time constraints. She has had some allergy symptoms increasing as well, some questions about what to do for this. She has Flonase but has not been using it consistently. Refer to OT    Patient Stated Goals I want my arm smaller - everybody ask me about my arm -and do not want infection again    Currently in Pain? No/denies                 LYMPHEDEMA/ONCOLOGY QUESTIONNAIRE - 05/18/22 0001       Right Upper Extremity Lymphedema   15 cm Proximal to Olecranon Process 37 cm    10 cm Proximal to Olecranon Process 37.5 cm    Olecranon Process 33 cm    15 cm Proximal to Ulnar Styloid Process 32.5 cm    10 cm Proximal to Ulnar Styloid Process 29.4 cm    Just Proximal to Ulnar Styloid Process 21.5 cm    Across Hand at [employer] 20.4 cm                Patient arrived with bandages off.    See circumference measurements  Circumferences stayed the same last few times . Patient report she has lymphedema in her thoracic under arm and even into her chest and collarbone. Discussed with patient her pump situation again.  Rep from [facility] did contact her but it appears that they only do phone or over the computer consultations. She will look into it otherwise OT will reach out again to [place]. Patient reports since 2 days ago she pump twice a day as well as done her bandages.  Patient arrived with no bandages on route fourth that she took it off about an hour 2 hours ago and was on the phone and could not replace it.    Patient done skincare at home   applying stockinette, clean compression glove  Then used to read sleeve OptiFlo brace over stockinette Short stretch  6 cm wrist <> hand to forearm, 3 laps through hand figure  eights  8 cm Wrist <> hand to  elbow figure eights type second rotation on dorsal hand 10 cm from middle forearm to upper arm   Patient to keep this on as much as he can and taking it off twice a day for pumping session and trying to include thoracic               OT Education - 05/18/22 1607     Education provided Yes    Education Details Progress and continuing bandages, precautions plan of care    Person(s) Educated Patient    Methods Explanation;Demonstration;Tactile cues;Verbal cues;Handout    Comprehension Verbal cues required;Returned demonstration;Verbalized understanding                 OT Long Term Goals - 04/25/22 1857       OT LONG TERM GOAL #1   Title Pt to decrease circumference of R UE more than 1 cm in upper arm and elbow and 2 cm at forearm to be measure for compression garments    Baseline R UE increase 2.5 cm in upper arm, 2.6 cm elbow and forearm 4.4 cm - pump not fitted- lost her night compressoin - day time old    Time 3    Period Weeks    Status New    Target Date 05/16/22      OT LONG TERM GOAL #2   Title Pt to be independent in wearing correct compression garmtns and use pump to maintain her R UE circumferecne to prevent infection in future    Baseline had few infections the last few yrs -and compresion day time -old and thin -lost her night time compression garment    Time 6    Period Weeks    Status New    Target Date 06/06/22                   Plan - 05/18/22 1607     Clinical Impression Statement [name] this date return for OT eval with history of right-sided breast cancer with metastasis to the axillary node, status post bilateral mastectomy with radiation, chemotherapy. She has R UE lymphedema and was seen for tx by this OT in 2018 - she was taking care of aging parents that past away the last few months. She went thru menopause and gain about 20lbs since seen in 2018. She did use some her pump but feel it do  not fit correctly and did replace some of her compression but thinner sleeves.  Patient reports  [employer] rep did get in contact with her and  but her model is old and they do phone or over computer consult.  Pt deongesting slower that OT expect and has trouble keeping bandages from sliding. Pt was out of town last week. Change 2 days ago  for pt to do bandages 2 x - am and pm and use her pump then too. SHe do report she has lymphedema in her thoracic.  She does not know where her nighttime sleeve is that she used in the past.  She does think she has a Jovi pack breast pad that she will try and find and start wearing for thoracic lymphedema.  This date used my demo read sleeve with L Velcro on right arm.  Patient used stockinette but based on and did 3 layer short stretch bandage for patient to wear as much as he can in combination with pumping twice a day will reassess in 2 days.  Pt to focus on that to with her pumping 2 x day -and wil reach out to Rep about her pump again. Very active out side in yard.  Patient's measurement stable  over the vacation last week. Patient to continue with range of motion and use of right upper extremity with bandages on.. Pt can benefit from skilled OT services decrease circumference to as close as L UE, fit with correct compression day time and night time compression garments..    OT Occupational Profile and History Problem Focused Assessment - Including review of records relating to presenting problem    Occupational performance deficits (Please refer to evaluation for details): ADL's;IADL's;Leisure;Play;Social Participation    Body Structure / Function / Physical Skills ADL;Edema;UE functional use;IADL    Rehab Potential Good    Clinical Decision Making Limited treatment options, no task modification necessary    Comorbidities Affecting Occupational Performance: May have comorbidities impacting occupational performance    Modification or Assistance to Complete Evaluation   No modification of tasks or assist necessary to complete eval    OT Frequency 3x / week    OT Duration 6 weeks    OT Treatment/Interventions Self-care/ADL training;Manual lymph drainage;Compression bandaging;Therapeutic exercise;Manual Therapy;Patient/family education    Consulted and Agree with Plan of Care Patient             Patient will benefit from skilled therapeutic intervention in order to improve the following deficits and impairments:   Body Structure / Function / Physical Skills: ADL, Edema, UE functional use, IADL       Visit Diagnosis: Postmastectomy lymphedema syndrome    Problem List Patient Active Problem List   Diagnosis Date Noted   Cellulitis 06/04/2020   Fever 06/04/2020   Lymphedema 06/04/2020    Rosalyn Gess, OTR/L,CLT 05/18/2022, 5:08 PM  [facility] [address] [place], [address] Phone: [phone]   Fax:  [phone]  Name: MIDA CORY MRN: [id number] Date of Birth: Jun 26, 1966

## 2022-05-20 ENCOUNTER — Ambulatory Visit: Payer: Managed Care, Other (non HMO) | Admitting: Occupational Therapy

## 2022-05-20 DIAGNOSIS — I972 Postmastectomy lymphedema syndrome: Secondary | ICD-10-CM

## 2022-05-20 NOTE — Therapy (Signed)
[facility] [address] [place], [address] Phone: [phone]   Fax:  [phone]  Occupational Therapy Treatment  Patient Details  Name: Sue Wong MRN: [id number] Date of Birth: September 29, 1966 Referring Provider (OT): DR Barrie Folk Date: 05/20/2022   OT End of Session - 05/20/22 1221     Visit Number 9    Number of Visits 12    Date for OT Re-Evaluation 06/06/22    OT Start Time 1115    OT Stop Time 1206    OT Time Calculation (min) 51 min    Activity Tolerance Patient tolerated treatment well    Behavior During Therapy [name] for tasks assessed/performed             Past Medical History:  Diagnosis Date   Arthritis    Breast cancer ([place])    GERD (gastroesophageal reflux disease)    Psoriasis    Rocky Mountain spotted fever     Past Surgical History:  Procedure Laterality Date   BREAST SURGERY Bilateral 1/9 and 12/27/16    There were no vitals filed for this visit.   Subjective Assessment - 05/20/22 1216     Subjective  I did do my pump for hour last night - but when I take my bandages off to shower I can feel my arm filling up    Pertinent History Sue Wong is seen for annual exam and history of right-sided breast cancer with metastasis to the axillary node, status post bilateral mastectomy with radiation, chemotherapy. Followed by oncology; on antiestrogen therapy through that office. Residual right arm lymphedema, and she is interested in physical therapy for this. She has really struggled with her weight; she had a lot of social stressors which have contributed, but is interested in trying medication to see if she can bring this down and ask questions about what she might be able to use. Has history of scalp psoriasis, this has been a little bit more of an issue recently. She also has some changes on her abdomen postradiation therapy and wonders if the dermatologist could  help her with this. She has had trouble with infections in the right arm affected by lymphedema; she tries to get Keflex on hand for this and is out. She describes disturbed sleep, not restful, poor concentration/memory issues. . She has not been back to gynecology. She has not had a colonoscopy, but does not feel like she would be able to have this done currently due to other time constraints. She has had some allergy symptoms increasing as well, some questions about what to do for this. She has Flonase but has not been using it consistently. Refer to OT    Patient Stated Goals I want my arm smaller - everybody ask me about my arm -and do not want infection again    Currently in Pain? No/denies                 LYMPHEDEMA/ONCOLOGY QUESTIONNAIRE - 05/20/22 0001       Right Upper Extremity Lymphedema   15 cm Proximal to Olecranon Process 37.5 cm    10 cm Proximal to Olecranon Process 36.5 cm    Olecranon Process 32 cm    15 cm Proximal to Ulnar Styloid Process 32.4 cm    10 cm Proximal to Ulnar Styloid Process 29 cm    Just Proximal to Ulnar Styloid Process 22.5 cm  Patient arrived with demo read sleeve on right upper extremity wrist with 3 short stretch bandages.   Measurements taken patient actually decreased at elbow and mid upper arm by 1 cm.   But forearm and wrist was about the same with 1 cm increase at wrist.   I reviewed this date with patient her self bandaging doing correctly with #6 3 times back-and-forth over the hand and webspace.   #8 bandage patient to start at the wrist go back 1 time over and through the hand and webspace and then circular up the forearm.   End and reinforced for patient to start #10 bandage mid forearm going up but not pulling too tight at the proximal upper arm causing a tourniquet.   OT thinks with this changes in review of bandaging that patient has to do between her pump use should see improvement now.    Since beginning  of this week to use her pump twice a day as well as doing her bandages.    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  Assist this date her Juzo over-the-counter circular compression sleeves from before.  There was 1 that was fitting okay patient to use that in combination with her gauntlet or 1 or 2 bandages to keep compression on right upper extremity in between her compression bandaging and pump.    Patient planned to go by [employer] and maybe get a over-the-counter compression sleeve that has a little more containment Medi  Harmony was recommended for patient to be able to use at the wedding as well as when she is out and about at the dental office working. IF she cannot wear her compression bandaging.  Patient will call me Monday to let me know her schedule.              OT Education - 05/20/22 1221     Education provided Yes    Education Details Progress and continuing bandages, precautions plan of care    Person(s) Educated Patient    Methods Explanation;Demonstration;Tactile cues;Verbal cues;Handout    Comprehension Verbal cues required;Returned demonstration;Verbalized understanding                 OT Long Term Goals - 04/25/22 1857       OT LONG TERM GOAL #1   Title Pt to decrease circumference of R UE more than 1 cm in upper arm and elbow and 2 cm at forearm to be measure for compression garments    Baseline R UE increase 2.5 cm in upper arm, 2.6 cm elbow and forearm 4.4 cm - pump not fitted- lost her night compressoin - day time old    Time 3    Period Weeks    Status New    Target Date 05/16/22      OT LONG TERM GOAL #2   Title Pt to be independent in wearing correct compression garmtns and use pump to maintain her R UE circumferecne to prevent infection in future    Baseline had few infections the last few yrs -and compresion day time -old and thin -lost her night time compression garment    Time 6    Period Weeks     Status New    Target Date 06/06/22                   Plan - 05/20/22 1222     Clinical Impression Statement Sue Wong this date return  for OT eval with history of right-sided breast cancer with metastasis to the axillary node, status post bilateral mastectomy with radiation, chemotherapy. She has R UE lymphedema and was seen for tx by this OT in 2018 - she was taking care of aging parents that past away the last few months. She went thru menopause and gain about 20lbs since seen in 2018. She did use some her pump but feel it do not fit correctly and did replace some of her compression but thinner sleeves.  Patient reports  [employer] rep did get in contact with her and  but her model is old and they do phone or over computer consult. THis OT reach out again to REp and they wil be in contact with pt in person.  Pt deongesting slower that OT expect and has trouble keeping bandages from sliding. Pt was out of town last week. Change this week for pt to do bandages 2 x - am and pm and use her pump then too. SHe do report she has lymphedema in her thoracic.  She does not know where her nighttime sleeve is that she used in the past.  She does think she has a Jovi pack breast pad that she will try and find and start wearing for thoracic lymphedema.  This date used my demo Reid sleeve with Velcro on right arm.  Patient used stockinette  based on and did 3 layer short stretch bandage for patient to wear as much as he can in combination with pumping twice a day. Pt upper arm decrease 1cm - reviewed with pt her bandaging and needed 3 corrections to make sure pt has gradient pressur.  Pt to focus on that to with her pumping 2 x day. Very active out side in yard.   Pt helping at Dental office next week - assess her old day sleeve to use with 1-2 bandages over to maintain progress. Patient to continue with range of motion and use of right upper extremity with bandages on.. Pt can benefit from skilled OT  services decrease circumference to as close as L UE, fit with correct compression day time and night time compression garments..    OT Occupational Profile and History Problem Focused Assessment - Including review of records relating to presenting problem    Occupational performance deficits (Please refer to evaluation for details): ADL's;IADL's;Leisure;Play;Social Participation    Body Structure / Function / Physical Skills ADL;Edema;UE functional use;IADL    Rehab Potential Good    Clinical Decision Making Limited treatment options, no task modification necessary    Comorbidities Affecting Occupational Performance: May have comorbidities impacting occupational performance    Modification or Assistance to Complete Evaluation  No modification of tasks or assist necessary to complete eval    OT Frequency 3x / week    OT Duration 6 weeks    OT Treatment/Interventions Self-care/ADL training;Manual lymph drainage;Compression bandaging;Therapeutic exercise;Manual Therapy;Patient/family education    Consulted and Agree with Plan of Care Patient             Patient will benefit from skilled therapeutic intervention in order to improve the following deficits and impairments:   Body Structure / Function / Physical Skills: ADL, Edema, UE functional use, IADL       Visit Diagnosis: Postmastectomy lymphedema syndrome    Problem List Patient Active Problem List   Diagnosis Date Noted   Cellulitis 06/04/2020   Fever 06/04/2020   Lymphedema 06/04/2020    Rosalyn Gess, OTR/L,CLT 05/20/2022, 12:27 PM  [facility] [address] [place], [address] Phone: [phone]   Fax:  [phone]  Name: Sue Wong MRN: [id number] Date of Birth: Jul 23, 1966

## 2022-06-01 ENCOUNTER — Ambulatory Visit: Payer: Managed Care, Other (non HMO) | Admitting: Occupational Therapy

## 2022-06-06 ENCOUNTER — Encounter: Payer: Managed Care, Other (non HMO) | Admitting: Occupational Therapy

## 2022-06-06 ENCOUNTER — Ambulatory Visit: Payer: BLUE CROSS/BLUE SHIELD | Attending: Internal Medicine | Admitting: Occupational Therapy

## 2022-06-06 DIAGNOSIS — I972 Postmastectomy lymphedema syndrome: Secondary | ICD-10-CM | POA: Insufficient documentation

## 2022-06-06 NOTE — Therapy (Signed)
[facility] [address], [place], [address] Phone: [phone]   Fax:  [phone]  Occupational Therapy Treatment  Patient Details  Name: Sue Wong MRN: [id number] Date of Birth: Aug 31, 1966 Referring Provider (OT): DR Barrie Folk Date: 06/06/2022   OT End of Session - 06/06/22 0949     Visit Number 10    Number of Visits 16    Date for OT Re-Evaluation 08/29/22    OT Start Time 0949    OT Stop Time 1025    OT Time Calculation (min) 36 min    Activity Tolerance Patient tolerated treatment well    Behavior During Therapy [facility] for tasks assessed/performed             Past Medical History:  Diagnosis Date   Arthritis    Breast cancer ([place])    GERD (gastroesophageal reflux disease)    Psoriasis    Rocky Mountain spotted fever     Past Surgical History:  Procedure Laterality Date   BREAST SURGERY Bilateral 1/9 and 12/27/16    There were no vitals filed for this visit.   Subjective Assessment - 06/06/22 0948     Subjective  I work about 3 days/week but is at 9-hour days.  I tried my old sleeve and then also the one that I picked up.  But I think is too tight at the wrist and my hand just swells up in it.  The sleeve is just not holding it.  I did not hear back from the pulm people yet.  And then I wrapped over the blue night sleeve most of the time after a pump at night.  I still feel like my arm is smaller than it was when we started.    Pertinent History Sue Wong is seen for annual exam and history of right-sided breast cancer with metastasis to the axillary node, status post bilateral mastectomy with radiation, chemotherapy. Followed by oncology; on antiestrogen therapy through that office. Residual right arm lymphedema, and she is interested in physical therapy for this. She has really struggled with her weight; she had a lot of social stressors which have contributed, but is  interested in trying medication to see if she can bring this down and ask questions about what she might be able to use. Has history of scalp psoriasis, this has been a little bit more of an issue recently. She also has some changes on her abdomen postradiation therapy and wonders if the dermatologist could help her with this. She has had trouble with infections in the right arm affected by lymphedema; she tries to get Keflex on hand for this and is out. She describes disturbed sleep, not restful, poor concentration/memory issues. . She has not been back to gynecology. She has not had a colonoscopy, but does not feel like she would be able to have this done currently due to other time constraints. She has had some allergy symptoms increasing as well, some questions about what to do for this. She has Flonase but has not been using it consistently. Refer to OT    Patient Stated Goals I want my arm smaller - everybody ask me about my arm -and do not want infection again                 LYMPHEDEMA/ONCOLOGY QUESTIONNAIRE - 06/06/22 0001       Right Upper Extremity Lymphedema   15 cm Proximal to Olecranon  Process 38 cm    10 cm Proximal to Olecranon Process 37.5 cm    Olecranon Process 33 cm    15 cm Proximal to Ulnar Styloid Process 33.5 cm    10 cm Proximal to Ulnar Styloid Process 30 cm    Just Proximal to Ulnar Styloid Process 21.8 cm    Across Hand at [employer] 20 cm                 Patient arrived with demo read sleeve on right upper extremity wrist with 3 short stretch bandages.   Measurements taken patient actually decreased at hand and wrist but increased about a centimeter in forearm, elbow and mid upper arm.  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 Patient tried to wear her old over-the-counter sleeve and also got MEdi Harmony sleeve to wear -but reports her hand swells up and is not containing her well at work. Wrap of  Flexit[employer] also did not get out to her to read assess her pump. OT contacted 2 times [employer] already.  Provided her with contact information of OT's contact for rep for Flex[employer].  Discussed with patient's other options at this stage for compression because of inability to keep bandages on right upper extremity.  Patient planning to lose weight.  Patient working 3 times a week.  As well as very active in the yard and outside. Recommend at this stage is CircAid garment for patient to use for a few weeks and follow-up with me for reassessment in 3 weeks. Patient in agreement and will contact [employer] to get assess for CircAid garments.         OT Education - 06/06/22 [phone]     Education provided Yes    Education Details Progress and continuing bandages, precautions plan of care    Person(s) Educated Patient    Methods Explanation;Demonstration;Tactile cues;Verbal cues;Handout    Comprehension Verbal cues required;Returned demonstration;Verbalized understanding                 OT Long Term Goals - 06/06/22 1226       OT LONG TERM GOAL #1   Title Pt to decrease circumference of R UE more than 1 cm in upper arm and elbow and 2 cm at forearm to be measure for compression garments    Baseline R UE increase 2.5 cm in upper arm, 2.6 cm elbow and forearm 4.4 cm - NOW patient on able to maintain bandages.  Working 3 days a week.  Pump not fitting well.-Recommend at this time CircAid's with her work schedule as well as planning to lose weight and she needs to contact the rep about Flexitouch pump to be looked at.    Time 9    Period Weeks    Status On-going    Target Date 08/08/22      OT LONG TERM GOAL #2   Title Pt to be independent in wearing correct compression garmtns and use pump to maintain her R UE circumferecne to prevent infection in future    Baseline had few infections the last few yrs -and compresion day time -old and thin -lost her night time compression  garment NOW recommend at this stage the CircAid for patient while working 3 days a week very active in the yard summer as well as planning to lose weight.    Time 6    Period Weeks    Status On-going  Target Date 07/18/22      OT LONG TERM GOAL #3   Status Deferred                   Plan - 06/06/22 0949     Clinical Impression Statement [name] this date return for OT eval with history of right-sided breast cancer with metastasis to the axillary node, status post bilateral mastectomy with radiation, chemotherapy. She has R UE lymphedema and was seen for tx by this OT in 2018 - she was taking care of aging parents that past away the last few months. She went thru menopause and gain about 20lbs since seen in 2018. She did use some her pump but feel it do not fit correctly and did replace some of her compression but thinner sleeves.   Patient to reach out to to [employer] pump rep that OT discussed issues with see when they can get out to her to check on her pump.  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  Patient also very active in the yard and trying to lose weight.  Discussed with patient different options because she does not feel patient's arm decongesting with her schedule as well as inability to maintain bandages on and pump use-  at this time OT recommend getting fitted with CircAid garments and patient in agreement it will be the best for her to wear and patient to follow-up with me in 3 weeks.   Pt can benefit from skilled OT services decrease circumference to as close as R UE, fit with correct compression day time and night time compression garments..    OT Occupational Profile and History Problem Focused Assessment - Including review of records relating to presenting problem    Occupational performance deficits (Please refer to evaluation for details):  ADL's;IADL's;Leisure;Play;Social Participation    Body Structure / Function / Physical Skills ADL;Edema;UE functional use;IADL    Rehab Potential Good    Clinical Decision Making Limited treatment options, no task modification necessary    Comorbidities Affecting Occupational Performance: May have comorbidities impacting occupational performance    Modification or Assistance to Complete Evaluation  No modification of tasks or assist necessary to complete eval    OT Frequency Biweekly    OT Duration 6 weeks    OT Treatment/Interventions Self-care/ADL training;Manual lymph drainage;Compression bandaging;Therapeutic exercise;Manual Therapy;Patient/family education    Consulted and Agree with Plan of Care Patient             Patient will benefit from skilled therapeutic intervention in order to improve the following deficits and impairments:   Body Structure / Function / Physical Skills: ADL, Edema, UE functional use, IADL       Visit Diagnosis: Postmastectomy lymphedema syndrome - Plan: Ot plan of care cert/re-cert    Problem List Patient Active Problem List   Diagnosis Date Noted   Cellulitis 06/04/2020   Fever 06/04/2020   Lymphedema 06/04/2020    Rosalyn Gess, OTR/L,CLT 06/06/2022, 12:30 PM  [facility] [address] [place], [address] Phone: [phone]   Fax:  [phone]  Name: MARCUS GROLL MRN: [id number] Date of Birth: 02/25/1966

## 2022-06-09 ENCOUNTER — Ambulatory Visit: Payer: BLUE CROSS/BLUE SHIELD | Admitting: Occupational Therapy

## 2022-10-13 ENCOUNTER — Ambulatory Visit: Payer: Managed Care, Other (non HMO) | Admitting: Dermatology

## 2023-04-26 ENCOUNTER — Emergency Department: Payer: BC Managed Care – PPO

## 2023-04-26 ENCOUNTER — Other Ambulatory Visit: Payer: Self-pay

## 2023-04-26 ENCOUNTER — Inpatient Hospital Stay
Admission: EM | Admit: 2023-04-26 | Discharge: 2023-04-30 | DRG: 872 | Disposition: A | Discharge status: Home / Self Care | Payer: BC Managed Care – PPO | Attending: Hospitalist | Admitting: Hospitalist

## 2023-04-26 DIAGNOSIS — I890 Lymphedema, not elsewhere classified: Secondary | ICD-10-CM | POA: Diagnosis present

## 2023-04-26 DIAGNOSIS — Z803 Family history of malignant neoplasm of breast: Secondary | ICD-10-CM

## 2023-04-26 DIAGNOSIS — Z885 Allergy status to narcotic agent status: Secondary | ICD-10-CM

## 2023-04-26 DIAGNOSIS — R6520 Severe sepsis without septic shock: Secondary | ICD-10-CM | POA: Diagnosis not present

## 2023-04-26 DIAGNOSIS — L4050 Arthropathic psoriasis, unspecified: Secondary | ICD-10-CM | POA: Diagnosis present

## 2023-04-26 DIAGNOSIS — L03313 Cellulitis of chest wall: Secondary | ICD-10-CM | POA: Diagnosis present

## 2023-04-26 DIAGNOSIS — N61 Mastitis without abscess: Secondary | ICD-10-CM | POA: Diagnosis present

## 2023-04-26 DIAGNOSIS — Z833 Family history of diabetes mellitus: Secondary | ICD-10-CM

## 2023-04-26 DIAGNOSIS — Z8042 Family history of malignant neoplasm of prostate: Secondary | ICD-10-CM | POA: Diagnosis not present

## 2023-04-26 DIAGNOSIS — Z881 Allergy status to other antibiotic agents status: Secondary | ICD-10-CM | POA: Diagnosis not present

## 2023-04-26 DIAGNOSIS — A419 Sepsis, unspecified organism: Principal | ICD-10-CM | POA: Diagnosis present

## 2023-04-26 DIAGNOSIS — Z79899 Other long term (current) drug therapy: Secondary | ICD-10-CM

## 2023-04-26 DIAGNOSIS — L0390 Cellulitis, unspecified: Secondary | ICD-10-CM | POA: Diagnosis not present

## 2023-04-26 DIAGNOSIS — K219 Gastro-esophageal reflux disease without esophagitis: Secondary | ICD-10-CM | POA: Diagnosis present

## 2023-04-26 DIAGNOSIS — Z853 Personal history of malignant neoplasm of breast: Secondary | ICD-10-CM | POA: Diagnosis not present

## 2023-04-26 DIAGNOSIS — Z9013 Acquired absence of bilateral breasts and nipples: Secondary | ICD-10-CM | POA: Diagnosis not present

## 2023-04-26 DIAGNOSIS — Z9882 Breast implant status: Secondary | ICD-10-CM | POA: Diagnosis not present

## 2023-04-26 DIAGNOSIS — M1990 Unspecified osteoarthritis, unspecified site: Secondary | ICD-10-CM | POA: Diagnosis present

## 2023-04-26 DIAGNOSIS — E872 Acidosis, unspecified: Secondary | ICD-10-CM | POA: Diagnosis present

## 2023-04-26 DIAGNOSIS — Z8249 Family history of ischemic heart disease and other diseases of the circulatory system: Secondary | ICD-10-CM

## 2023-04-26 LAB — COMPREHENSIVE METABOLIC PANEL
ALT: 23 U/L (ref 0–44)
AST: 26 U/L (ref 15–41)
Albumin: 4.4 g/dL (ref 3.5–5.0)
Alkaline Phosphatase: 91 U/L (ref 38–126)
Anion gap: 8 (ref 5–15)
BUN: 17 mg/dL (ref 6–20)
CO2: 24 mmol/L (ref 22–32)
Calcium: 9.1 mg/dL (ref 8.9–10.3)
Chloride: 102 mmol/L (ref 98–111)
Creatinine, Ser: 1.05 mg/dL — ABNORMAL HIGH (ref 0.44–1.00)
GFR, Estimated: >60 mL/min (ref >60)
Glucose, Bld: 130 mg/dL — ABNORMAL HIGH (ref 70–99)
Potassium: 4.1 mmol/L (ref 3.5–5.1)
Sodium: 134 mmol/L — ABNORMAL LOW (ref 135–145)
Total Bilirubin: 0.6 mg/dL (ref 0.3–1.2)
Total Protein: 8.2 g/dL — ABNORMAL HIGH (ref 6.5–8.1)

## 2023-04-26 LAB — CBC
HCT: 41.8 % (ref 36.0–46.0)
Hemoglobin: 13.6 g/dL (ref 12.0–15.0)
MCH: 32 pg (ref 26.0–34.0)
MCHC: 32.5 g/dL (ref 30.0–36.0)
MCV: 98.4 fL (ref 80.0–100.0)
Platelets: 302 10*3/uL (ref 150–400)
RBC: 4.25 MIL/uL (ref 3.87–5.11)
RDW: 13 % (ref 11.5–15.5)
WBC: 17.2 10*3/uL — ABNORMAL HIGH (ref 4.0–10.5)
nRBC: 0 % (ref 0.0–0.2)

## 2023-04-26 LAB — LACTIC ACID, PLASMA
Lactic Acid, Venous: 2 mmol/L (ref 0.5–1.9)
Lactic Acid, Venous: 2.5 mmol/L (ref 0.5–1.9)
Lactic Acid, Venous: 3.3 mmol/L (ref 0.5–1.9)

## 2023-04-26 MED ORDER — ENOXAPARIN SODIUM 40 MG/0.4ML IJ SOSY
40.0000 mg | PREFILLED_SYRINGE | INTRAMUSCULAR | Status: DC
  Administered 2023-04-26 – 2023-04-29 (×4): 40 mg via SUBCUTANEOUS
  Filled 2023-04-26 (×4): qty 0.4

## 2023-04-26 MED ORDER — SODIUM CHLORIDE 0.9 % IV BOLUS
500.0000 mL | Freq: Once | INTRAVENOUS | Status: AC
  Administered 2023-04-26: 500 mL via INTRAVENOUS

## 2023-04-26 MED ORDER — GUAIFENESIN 100 MG/5ML PO LIQD
5.0000 mL | ORAL | Status: DC | PRN
  Administered 2023-04-27: 5 mL via ORAL
  Filled 2023-04-26 (×2): qty 10

## 2023-04-26 MED ORDER — IBUPROFEN 400 MG PO TABS
400.0000 mg | ORAL_TABLET | Freq: Four times a day (QID) | ORAL | Status: DC | PRN
  Administered 2023-04-26: 400 mg via ORAL
  Filled 2023-04-26: qty 1

## 2023-04-26 MED ORDER — ACETAMINOPHEN 325 MG PO TABS
650.0000 mg | ORAL_TABLET | Freq: Four times a day (QID) | ORAL | Status: DC | PRN
  Administered 2023-04-27 – 2023-04-30 (×3): 650 mg via ORAL
  Filled 2023-04-26 (×3): qty 2

## 2023-04-26 MED ORDER — VANCOMYCIN HCL 750 MG/150ML IV SOLN: 750.0000 mg | INTRAVENOUS | Status: DC

## 2023-04-26 MED ORDER — SODIUM CHLORIDE 0.9 % IV SOLN: INTRAVENOUS | Status: AC

## 2023-04-26 MED ORDER — VANCOMYCIN HCL IN DEXTROSE 1-5 GM/200ML-% IV SOLN
1000.0000 mg | Freq: Once | INTRAVENOUS | Status: DC
  Filled 2023-04-26: qty 200

## 2023-04-26 MED ORDER — ACETAMINOPHEN 500 MG PO TABS
1000.0000 mg | ORAL_TABLET | Freq: Once | ORAL | Status: AC
  Administered 2023-04-26: 1000 mg via ORAL
  Filled 2023-04-26: qty 2

## 2023-04-26 MED ORDER — TRAZODONE HCL 50 MG PO TABS
50.0000 mg | ORAL_TABLET | Freq: Every evening | ORAL | Status: DC | PRN
  Administered 2023-04-27: 50 mg via ORAL
  Filled 2023-04-26: qty 1

## 2023-04-26 MED ORDER — HYDRALAZINE HCL 20 MG/ML IJ SOLN: 10.0000 mg | INTRAMUSCULAR | Status: DC | PRN

## 2023-04-26 MED ORDER — SODIUM CHLORIDE 0.9 % IV BOLUS
1000.0000 mL | Freq: Once | INTRAVENOUS | Status: AC
  Administered 2023-04-26: 1000 mL via INTRAVENOUS

## 2023-04-26 MED ORDER — ONDANSETRON HCL 4 MG/2ML IJ SOLN
4.0000 mg | Freq: Four times a day (QID) | INTRAMUSCULAR | Status: DC | PRN
  Filled 2023-04-26: qty 2

## 2023-04-26 MED ORDER — SENNOSIDES-DOCUSATE SODIUM 8.6-50 MG PO TABS: 1.0000 | ORAL_TABLET | Freq: Every evening | ORAL | Status: DC | PRN

## 2023-04-26 MED ORDER — KETOROLAC TROMETHAMINE 15 MG/ML IJ SOLN
15.0000 mg | Freq: Once | INTRAMUSCULAR | Status: AC
  Administered 2023-04-26: 15 mg via INTRAVENOUS
  Filled 2023-04-26: qty 1

## 2023-04-26 MED ORDER — METRONIDAZOLE 500 MG/100ML IV SOLN
500.0000 mg | Freq: Once | INTRAVENOUS | Status: AC
  Administered 2023-04-26: 500 mg via INTRAVENOUS
  Filled 2023-04-26: qty 100

## 2023-04-26 MED ORDER — DIPHENHYDRAMINE HCL 50 MG/ML IJ SOLN
50.0000 mg | Freq: Once | INTRAMUSCULAR | Status: AC
  Administered 2023-04-26: 50 mg via INTRAVENOUS
  Filled 2023-04-26: qty 1

## 2023-04-26 MED ORDER — VANCOMYCIN HCL 1500 MG/300ML IV SOLN
1500.0000 mg | Freq: Once | INTRAVENOUS | Status: AC
  Administered 2023-04-26: 1500 mg via INTRAVENOUS
  Filled 2023-04-26: qty 300

## 2023-04-26 MED ORDER — IPRATROPIUM-ALBUTEROL 0.5-2.5 (3) MG/3ML IN SOLN: 3.0000 mL | RESPIRATORY_TRACT | Status: DC | PRN

## 2023-04-26 MED ORDER — METOPROLOL TARTRATE 5 MG/5ML IV SOLN: 5.0000 mg | INTRAVENOUS | Status: DC | PRN

## 2023-04-26 MED ORDER — SODIUM CHLORIDE 0.9 % IV SOLN
2.0000 g | Freq: Once | INTRAVENOUS | Status: AC
  Administered 2023-04-26: 2 g via INTRAVENOUS
  Filled 2023-04-26: qty 12.5

## 2023-04-26 NOTE — Consult Note (Signed)
PHARMACY -  BRIEF ANTIBIOTIC NOTE   Pharmacy has received consult(s) for vancomycin and cefepime from an ED provider. Patient is also ordered metronidazole. The patient's profile has been reviewed for ht/wt/allergies/indication/available labs.    One time order(s) placed for  --Vancomycin 1 g IV --Cefepime 2 g IV  Further antibiotics/pharmacy consults should be ordered by admitting physician if indicated.                       Thank you, Tressie Ellis 04/26/2023  4:04 PM

## 2023-04-26 NOTE — Progress Notes (Addendum)
Pharmacy Antibiotic Note  Sue Wong is a 57 y.o. female admitted on 04/26/2023 with cellulitis.  Pharmacy has been consulted for vancomycin dosing.  Scr 1.05 slightly elevated. Baseline Scr 0.8.   Plan: Vancomycin 1500 mg x 1 loading dose  Vancomycin 750 mg every 24 hours Goal AUC 400-550  Estimated AUC 491.3, Cmin 12.7 Used Vd 0.5, Scr 1.05, IBW   Follow up renal function in AM.  Height: 5' (152.4 cm) Weight: 76.2 kg (168 lb) IBW/kg (Calculated) : 45.5  Temp (24hrs), Avg:100.3 F (37.9 C), Min:100.3 F (37.9 C), Max:100.3 F (37.9 C)  Recent Labs  Lab 04/26/23 1500 04/26/23 1624  WBC 17.2*  --   CREATININE 1.05*  --   LATICACIDVEN 2.0* 2.5*    Estimated Creatinine Clearance: 54.6 mL/min (A) (by C-G formula based on SCr of 1.05 mg/dL (H)).    Allergies  Allergen Reactions   Codeine Itching    Severe itching    Oxycodone Itching    Severe itching    Antimicrobials this admission: cefepime 5/22 >>  vancomycin 5/22 >>  Metronidazole 5/22 >>  Dose adjustments this admission: N/a  Microbiology results: 5/22 BCx: in process  Thank you for allowing pharmacy to be a part of this patient's care.     Elliot Gurney, PharmD, BCPS Clinical Pharmacist  04/26/2023 5:56 PM

## 2023-04-26 NOTE — Progress Notes (Signed)
       CROSS COVER NOTE  NAME: PRESCOTT NARES MRN: [id number] DOB : Oct 18, 1966    HPI/Events of Note   Report:patient reported severe itching after vancomycin infusion started  On review of chart:patient post mastectomy secondary to breast cancer developed cellulitis of right breast. She was admitted with severe sepsis secondary to bilateral/chest wall cellulitis    Assessment and  Interventions   Assessment: Erythema breast upper abdomen and back now knew per patient but itching has spread from back to head arm and legs. Mild peticheal redness now noted on left arm. None seen on legs  Plan: Discussed with pharmacist. Plan to discontinue, list as allergy and add other MRSA coverage if BCID confirms need Benadryl 50 mg x1      Donnie Mesa NP [facility] Cross Cover 7pm-7am - check amion for availability Pager [phone]

## 2023-04-26 NOTE — ED Provider Triage Note (Signed)
Emergency Medicine Provider Triage Evaluation Note  Sue Wong , a 57 y.o. female  was evaluated in triage.  Pt complains of fever, extreme fatigue yesterday and today awakened with right breast erythema that has since spread to abdomen and back. Breast cancer survivor and has lymphedema at baseline in the right arm.  Physical Exam  BP 130/79 (BP Location: Left Arm)   Pulse (!) 124   Temp 100.3 F (37.9 C) (Oral)   Resp 16   Ht 5' (1.524 m)   Wt 76.2 kg   SpO2 100%   BMI 32.81 kg/m  Gen:   Awake, no distress   Resp:  Normal effort  MSK:   Moves extremities without difficulty  Other:  Erythema noted over right breast, abdomen, and back.  Medical Decision Making  Medically screening exam initiated at 2:59 PM.  Appropriate orders placed.  Sue Wong was informed that the remainder of the evaluation will be completed by another provider, this initial triage assessment does not replace that evaluation, and the importance of remaining in the ED until their evaluation is complete.  Sepsis workup started.   Chinita Pester, FNP 04/26/23 1505

## 2023-04-26 NOTE — ED Provider Notes (Signed)
[facility] Provider Note    Event Date/Time   First MD Initiated Contact with Patient 04/26/23 1546     (approximate)   History   Chief Complaint: Cellulitis   HPI  Sue Wong is a 57 y.o. female with a past history of breast cancer status post double mastectomy, chemo, radiation with resulting lymphedema who comes to the ED complaining of rapidly expanding red rash over the right chest wall, extending to the left and around to her back.  No shortness of breath.  Does endorse chills.  No vomiting or diarrhea.  Reports that she had a similar presentation with cellulitis of her chest wall in the past.  She also notes that the rash seems to be centered around an apparent insect bite on her right lower chest wall.  No recent tick bites     Physical Exam   Triage Vital Signs: ED Triage Vitals  Enc Vitals Group     BP 04/26/23 1453 130/79     Pulse Rate 04/26/23 1453 (!) 124     Resp 04/26/23 1453 16     Temp 04/26/23 1453 100.3 F (37.9 C)     Temp Source 04/26/23 1453 Oral     SpO2 04/26/23 1453 100 %     Weight 04/26/23 1454 168 lb (76.2 kg)     Height 04/26/23 1454 5' (1.524 m)     Head Circumference --      Peak Flow --      Pain Score 04/26/23 1457 0     Pain Loc --      Pain Edu? --      Excl. in GC? --     Most recent vital signs: Vitals:   04/27/23 1954 04/27/23 2115  BP:  (!) 117/56  Pulse:  (!) 107  Resp:  18  Temp:  99.4 F (37.4 C)  SpO2: 98% 97%    General: Awake, no distress.  CV:  Good peripheral perfusion.  Tachycardia heart rate 120.  Normal distal pulses Resp:  Normal effort.  Clear to auscultation bilaterally, no wheezing or stridor Abd:  No distention.  Soft nontender Other:  Chest wall shows indurated erythematous irregularly demarcated rash extending from right inferior chest wall around to the back, nearly circumferential around the entire thorax.  Not urticarial.  At the apparent center of the rash on the  right inferior chest wall, there is a small skin puncture site which does not look like a snake bite.  No attached tick.  No target lesion. Oropharynx is clear.  No intraoral swelling.   ED Results / Procedures / Treatments   Labs (all labs ordered are listed, but only abnormal results are displayed) Labs Reviewed  CBC - Abnormal; Notable for the following components:      Result Value   WBC 17.2 (*)    All other components within normal limits  COMPREHENSIVE METABOLIC PANEL - Abnormal; Notable for the following components:   Sodium 134 (*)    Glucose, Bld 130 (*)    Creatinine, Ser 1.05 (*)    Total Protein 8.2 (*)    All other components within normal limits  LACTIC ACID, PLASMA - Abnormal; Notable for the following components:   Lactic Acid, Venous 2.0 (*)    All other components within normal limits  LACTIC ACID, PLASMA - Abnormal; Notable for the following components:   Lactic Acid, Venous 2.5 (*)    All other components within normal limits  BASIC METABOLIC PANEL - Abnormal; Notable for the following components:   Chloride 113 (*)    CO2 20 (*)    Glucose, Bld 125 (*)    Calcium 7.6 (*)    Anion gap 4 (*)    All other components within normal limits  CBC - Abnormal; Notable for the following components:   WBC 10.8 (*)    RBC 3.40 (*)    Hemoglobin 11.1 (*)    HCT 34.1 (*)    MCV 100.3 (*)    All other components within normal limits  LACTIC ACID, PLASMA - Abnormal; Notable for the following components:   Lactic Acid, Venous 3.3 (*)    All other components within normal limits  BRAIN NATRIURETIC PEPTIDE - Abnormal; Notable for the following components:   B Natriuretic Peptide 161.9 (*)    All other components within normal limits  CULTURE, BLOOD (SINGLE)  CULTURE, BLOOD (SINGLE)  MAGNESIUM  LACTIC ACID, PLASMA  HIV ANTIBODY (ROUTINE TESTING W REFLEX)  BASIC METABOLIC PANEL  CBC  MAGNESIUM     EKG    RADIOLOGY CT chest interpreted by me, negative for  subcutaneous air.  No intrathoracic abnormality.  Radiology report reviewed   PROCEDURES:  .Critical Care  Performed by: Sharman Cheek, MD Authorized by: Sharman Cheek, MD   Critical care provider statement:    Critical care time (minutes):  35   Critical care time was exclusive of:  Separately billable procedures and treating other patients   Critical care was necessary to treat or prevent imminent or life-threatening deterioration of the following conditions:  Sepsis   Critical care was time spent personally by me on the following activities:  Development of treatment plan with patient or surrogate, discussions with consultants, evaluation of patient's response to treatment, examination of patient, obtaining history from patient or surrogate, ordering and performing treatments and interventions, ordering and review of laboratory studies, ordering and review of radiographic studies, pulse oximetry, re-evaluation of patient's condition and review of old charts   Care discussed with: admitting provider   Comments:        .1-3 Lead EKG Interpretation  Performed by: Sharman Cheek, MD Authorized by: Sharman Cheek, MD     Interpretation: abnormal     ECG rate:  129   ECG rate assessment: tachycardic     Rhythm: sinus tachycardia     Ectopy: none     Conduction: normal      MEDICATIONS ORDERED IN ED: Medications  metoprolol tartrate (LOPRESSOR) injection 5 mg (has no administration in time range)  hydrALAZINE (APRESOLINE) injection 10 mg (has no administration in time range)  ipratropium-albuterol (DUONEB) 0.5-2.5 (3) MG/3ML nebulizer solution 3 mL (has no administration in time range)  ondansetron (ZOFRAN) injection 4 mg (has no administration in time range)  acetaminophen (TYLENOL) tablet 650 mg (650 mg Oral Given 04/27/23 1933)  traZODone (DESYREL) tablet 50 mg (50 mg Oral Given 04/27/23 2152)  senna-docusate (Senokot-S) tablet 1 tablet (has no administration in  time range)  guaiFENesin (ROBITUSSIN) 100 MG/5ML liquid 5 mL (5 mLs Oral Given 04/27/23 1415)  0.9 %  sodium chloride infusion (0 mLs Intravenous Stopped 04/27/23 1431)  enoxaparin (LOVENOX) injection 40 mg (40 mg Subcutaneous Given 04/27/23 2152)  ibuprofen (ADVIL) tablet 400 mg (400 mg Oral Given 04/26/23 1850)  cefTRIAXone (ROCEPHIN) 2 g in sodium chloride 0.9 % 100 mL IVPB (0 g Intravenous Stopped 04/27/23 1923)  dextromethorphan-guaiFENesin (MUCINEX DM) 30-600 MG per 12 hr tablet 1 tablet (1 tablet  Oral Given 04/27/23 2152)  ipratropium-albuterol (DUONEB) 0.5-2.5 (3) MG/3ML nebulizer solution 3 mL (3 mLs Nebulization Given 04/27/23 1954)  ketorolac (TORADOL) 15 MG/ML injection 15 mg (15 mg Intravenous Given 04/26/23 1620)  sodium chloride 0.9 % bolus 1,000 mL (0 mLs Intravenous Stopped 04/26/23 1745)  ceFEPIme (MAXIPIME) 2 g in sodium chloride 0.9 % 100 mL IVPB (0 g Intravenous Stopped 04/26/23 1711)  metroNIDAZOLE (FLAGYL) IVPB 500 mg (0 mg Intravenous Stopped 04/26/23 1841)  acetaminophen (TYLENOL) tablet 1,000 mg (1,000 mg Oral Given 04/26/23 1708)  sodium chloride 0.9 % bolus 1,000 mL (1,000 mLs Intravenous New Bag/Given 04/26/23 1744)  sodium chloride 0.9 % bolus 500 mL (0 mLs Intravenous Stopped 04/26/23 1847)  vancomycin (VANCOREADY) IVPB 1500 mg/300 mL (0 mg Intravenous Stopped 04/26/23 2055)  diphenhydrAMINE (BENADRYL) injection 50 mg (50 mg Intravenous Given 04/26/23 2030)  potassium chloride SA (KLOR-CON M) CR tablet 40 mEq (40 mEq Oral Given 04/27/23 0916)  diphenhydrAMINE (BENADRYL) 50 MG/ML injection (  Given 04/27/23 1400)     IMPRESSION / MDM / ASSESSMENT AND PLAN / ED COURSE  I reviewed the triage vital signs and the nursing notes.  DDx: Cellulitis, sepsis, necrotizing fasciitis AKI, dehydration.  Doubt ACS PE or anaphylaxis.  Patient's presentation is most consistent with acute presentation with potential threat to life or bodily function.  Patient presents with erythematous rash  over the thorax consistent with cellulitis.  She is febrile and tachycardic, concerning for sepsis.  With her rapid onset and progression of the rash, CT obtained to ensure there is no subcutaneous emphysema.  This was negative, doubt necrotizing fasciitis.  Patient given vancomycin Flagyl cefepime for initial antibiotic coverage.  Case discussed with the hospitalist for further management.       FINAL CLINICAL IMPRESSION(S) / ED DIAGNOSES   Final diagnoses:  Cellulitis of chest wall  Sepsis, due to unspecified organism, unspecified whether acute organ dysfunction present [facility])     Rx / DC Orders   ED Discharge Orders     None        Note:  This document was prepared using Dragon voice recognition software and may include unintentional dictation errors.   Sharman Cheek, MD 04/27/23 2226

## 2023-04-26 NOTE — Progress Notes (Addendum)
Pt has IVBP vancomycin running and starts complaining of itching on arm and and abdomen. NP Jon Billings made aware,  Will continue to monitor.  Update 2018: NP Jon Billings ordered to stopped IVPB vanc and this time and ordered Benadryl 50 mg intravenous once. Will continue to monitor.

## 2023-04-26 NOTE — ED Triage Notes (Signed)
Pt to ED for cellulitis to right breast to back. Reports fever last night. Denies pain or itching.

## 2023-04-26 NOTE — H&P (Signed)
History and Physical    Alexianna Celli Willard ZOX:[id number] DOB: Mar 06, 1966 DOA: 04/26/2023  PCP: Lynnea Ferrier, MD Patient coming from: PCP office  Chief Complaint: Breast cellulitis  HPI: Sue Wong is a 57 y.o. female with medical history significant of breast cancer status postmastectomy with residual right upper extremity lymphedema comes to the hospital with complaints of erythema which is rapidly spreading from her right breast.  Patient states this morning she woke up with some erythema of her right breast which slowly extended towards upper abdomen, towards the left breast and her back with subjective fevers and chills at home.  Due to this she went to go see her PCP.  She received a shot of Rocephin and was given doxycycline.  She was also advised to come to the ER for further management.  In the ED patient was noted to be septic with evidence of fever, tachycardia, leukocytosis and elevated lactic acid.  CT of the chest did not show any acute intrathoracic pathology. Started on Broad spectrum Abx and admitted.    Review of Systems: As per HPI otherwise 10 point review of systems negative.  Review of Systems Otherwise negative except as per HPI, including: General: Denies fever, chills, night sweats or unintended weight loss. Resp: Denies cough, wheezing, shortness of breath. Cardiac: Denies chest pain, palpitations, orthopnea, paroxysmal nocturnal dyspnea. GI: Denies abdominal pain, nausea, vomiting, diarrhea or constipation GU: Denies dysuria, frequency, hesitancy or incontinence MS: Denies muscle aches, joint pain or swelling Neuro: Denies headache, neurologic deficits (focal weakness, numbness, tingling), abnormal gait Psych: Denies anxiety, depression, SI/HI/AVH Skin: Erythema of right chest wall ID: Denies sick contacts, exotic exposures, travel  Past Medical History:  Diagnosis Date   Arthritis    Breast cancer (HCC)    GERD (gastroesophageal reflux disease)     Psoriasis    [place] spotted fever     Past Surgical History:  Procedure Laterality Date   BREAST SURGERY Bilateral 1/9 and 12/27/16    SOCIAL HISTORY:  reports that she has never smoked. She has never used smokeless tobacco. She reports that she does not drink alcohol and does not use drugs.  Allergies  Allergen Reactions   Codeine Itching    Severe itching    Oxycodone Itching    Severe itching    FAMILY HISTORY: Family History  Problem Relation Age of Onset   Hypertension Mother    Diabetes Father    Prostate cancer Father    Breast cancer Sister    Diabetes Sister      Prior to Admission medications   Medication Sig Start Date End Date Taking? Authorizing Provider  Calcium Carbonate-Vitamin D3 (CALCIUM 600-D) 600-400 MG-UNIT TABS Take 1 tablet by mouth daily.    [provider]  fluticasone (FLONASE) 50 MCG/ACT nasal spray Place 1 spray into both nostrils daily. 06/07/20   Pokhrel, Rebekah [place], MD  letrozole (FEMARA) 2.5 MG tablet Take 2.5 mg by mouth every evening.    [provider]  pantoprazole (PROTONIX) 40 MG tablet Take 40 mg by mouth daily as needed (acid reflux symptoms).    [provider]  venlafaxine XR (EFFEXOR-XR) 75 MG 24 hr capsule Take 75 mg by mouth every evening.    [provider]    Physical Exam: Vitals:   04/26/23 1453 04/26/23 1454 04/26/23 1600  BP: 130/79  139/68  Pulse: (!) 124  (!) 128  Resp: 16  16  Temp: 100.3 F (37.9 C)  TempSrc: Oral    SpO2: 100%  99%  Weight:  76.2 kg   Height:  5' (1.524 m)       Constitutional: NAD, calm, comfortable Eyes: PERRL, lids and conjunctivae normal ENMT: Mucous membranes are moist. Posterior pharynx clear of any exudate or lesions.Normal dentition.  Neck: normal, supple, no masses, no thyromegaly Respiratory: clear to auscultation bilaterally, no wheezing, no crackles. Normal respiratory effort. No accessory muscle use.  Cardiovascular: Regular rate  and rhythm, no murmurs / rubs / gallops. No extremity edema. 2+ pedal pulses. No carotid bruits.  Abdomen: no tenderness, no masses palpated. No hepatosplenomegaly. Bowel sounds positive.  Musculoskeletal: no clubbing / cyanosis. No joint deformity upper and lower extremities. Good ROM, no contractures. Normal muscle tone.  Skin: Bilateral breast erythema extending upper abdomen and back area.  Erythema is more on the right breast over the left.  No obvious area of fluctuance noted. Neurologic: CN 2-12 grossly intact. Sensation intact, DTR normal. Strength 5/5 in all 4.  Psychiatric: Normal judgment and insight. Alert and oriented x 3. Normal mood.   Patient's RN in the room during my examination  Labs on Admission: I have personally reviewed following labs and imaging studies  CBC: Recent Labs  Lab 04/26/23 1500  WBC 17.2*  HGB 13.6  HCT 41.8  MCV 98.4  PLT 302   Basic Metabolic Panel: Recent Labs  Lab 04/26/23 1500  NA 134*  K 4.1  CL 102  CO2 24  GLUCOSE 130*  BUN 17  CREATININE 1.05*  CALCIUM 9.1   GFR: Estimated Creatinine Clearance: 54.6 mL/min (A) (by C-G formula based on SCr of 1.05 mg/dL (H)). Liver Function Tests: Recent Labs  Lab 04/26/23 1500  AST 26  ALT 23  ALKPHOS 91  BILITOT 0.6  PROT 8.2*  ALBUMIN 4.4   No results for input(s): "LIPASE", "AMYLASE" in the last 168 hours. No results for input(s): "AMMONIA" in the last 168 hours. Coagulation Profile: No results for input(s): "INR", "PROTIME" in the last 168 hours. Cardiac Enzymes: No results for input(s): "CKTOTAL", "CKMB", "CKMBINDEX", "TROPONINI" in the last 168 hours. BNP (last 3 results) No results for input(s): "PROBNP" in the last 8760 hours. HbA1C: No results for input(s): "HGBA1C" in the last 72 hours. CBG: No results for input(s): "GLUCAP" in the last 168 hours. Lipid Profile: No results for input(s): "CHOL", "HDL", "LDLCALC", "TRIG", "CHOLHDL", "LDLDIRECT" in the last 72  hours. Thyroid Function Tests: No results for input(s): "TSH", "T4TOTAL", "FREET4", "T3FREE", "THYROIDAB" in the last 72 hours. Anemia Panel: No results for input(s): "VITAMINB12", "FOLATE", "FERRITIN", "TIBC", "IRON", "RETICCTPCT" in the last 72 hours. Urine analysis: No results found for: "COLORURINE", "APPEARANCEUR", "LABSPEC", "PHURINE", "GLUCOSEU", "HGBUR", "BILIRUBINUR", "KETONESUR", "PROTEINUR", "UROBILINOGEN", "NITRITE", "LEUKOCYTESUR" Sepsis Labs: !!!!!!!!!!!!!!!!!!!!!!!!!!!!!!!!!!!!!!!!!!!! @LABRCNTIP (procalcitonin:4,lacticidven:4) )No results found for this or any previous visit (from the past 240 hour(s)).   Radiological Exams on Admission: CT Chest Wo Contrast  Result Date: 04/26/2023 CLINICAL DATA:  Chest wall pain cellulitis to right breast EXAM: CT CHEST WITHOUT CONTRAST TECHNIQUE: Multidetector CT imaging of the chest was performed following the standard protocol without IV contrast. RADIATION DOSE REDUCTION: This exam was performed according to the departmental dose-optimization program which includes automated exposure control, adjustment of the mA and/or kV according to patient size and/or use of iterative reconstruction technique. COMPARISON:  None Available. FINDINGS: Cardiovascular: Limited evaluation without intravenous contrast. Nonaneurysmal aorta. Normal cardiac size. No pericardial effusion Mediastinum/Nodes: No enlarged mediastinal or axillary lymph nodes. Thyroid gland, trachea, and esophagus demonstrate no  significant findings. Lungs/Pleura: Mild fibrosis at the right apex and subpleural right anterior lung likely due to post radiation changes. No acute airspace disease, pleural effusion or pneumothorax. Upper Abdomen: No acute abnormality. Musculoskeletal: Bilateral mastectomy with reconstruction. No acute osseous abnormality. IMPRESSION: 1. No CT evidence for acute intrathoracic abnormality. Electronically Signed   By: Jasmine Pang M.D.   On: 04/26/2023 17:16      All images have been reviewed by me personally.    Assessment/Plan Principal Problem:   Sepsis (HCC) Active Problems:   Cellulitis   Lymphedema   Severe sepsis secondary to bilateral breast/chest wall cellulitis, right greater than left Lactic acidosis - Patient currently has severe sepsis which seems to be improving.  Sepsis protocol initiated.  Follow culture data, empirically start patient on IV vancomycin.  IV fluids, trend lactate.  Supportive care.  CT scan does not show any deeper infection at this time.  History of breast cancer - Status postmastectomy with residual right upper extremity lymphedema.  Has breast implants in place  Only meds she will be takes at home currently has Tylenol for osteoarthritis/psoriatic arthritis and as needed     DVT prophylaxis: Lovenox Code Status: Full Family Communication: Mother Consults called: None Admission status: Inpatient admit  Status is: Inpatient Remains inpatient appropriate because: Severe sepsis requiring IV antibiotics   Time Spent: 65 minutes.  >50% of the time was devoted to discussing the patients care, assessment, plan and disposition with other care givers along with counseling the patient about the risks and benefits of treatment.    Xenia Nile Joline Maxcy MD [facility]  If 7PM-7AM, please contact night-coverage   04/26/2023, 5:47 PM

## 2023-04-27 ENCOUNTER — Inpatient Hospital Stay: Payer: BC Managed Care – PPO

## 2023-04-27 ENCOUNTER — Encounter: Payer: Self-pay | Admitting: Internal Medicine

## 2023-04-27 DIAGNOSIS — A419 Sepsis, unspecified organism: Secondary | ICD-10-CM | POA: Diagnosis not present

## 2023-04-27 DIAGNOSIS — L0390 Cellulitis, unspecified: Secondary | ICD-10-CM

## 2023-04-27 LAB — BASIC METABOLIC PANEL
Anion gap: 4 — ABNORMAL LOW (ref 5–15)
BUN: 14 mg/dL (ref 6–20)
CO2: 20 mmol/L — ABNORMAL LOW (ref 22–32)
Calcium: 7.6 mg/dL — ABNORMAL LOW (ref 8.9–10.3)
Chloride: 113 mmol/L — ABNORMAL HIGH (ref 98–111)
Creatinine, Ser: 0.88 mg/dL (ref 0.44–1.00)
GFR, Estimated: >60 mL/min (ref >60)
Glucose, Bld: 125 mg/dL — ABNORMAL HIGH (ref 70–99)
Potassium: 3.6 mmol/L (ref 3.5–5.1)
Sodium: 137 mmol/L (ref 135–145)

## 2023-04-27 LAB — CBC
HCT: 34.1 % — ABNORMAL LOW (ref 36.0–46.0)
Hemoglobin: 11.1 g/dL — ABNORMAL LOW (ref 12.0–15.0)
MCH: 32.6 pg (ref 26.0–34.0)
MCHC: 32.6 g/dL (ref 30.0–36.0)
MCV: 100.3 fL — ABNORMAL HIGH (ref 80.0–100.0)
Platelets: 216 10*3/uL (ref 150–400)
RBC: 3.4 MIL/uL — ABNORMAL LOW (ref 3.87–5.11)
RDW: 13.2 % (ref 11.5–15.5)
WBC: 10.8 10*3/uL — ABNORMAL HIGH (ref 4.0–10.5)
nRBC: 0 % (ref 0.0–0.2)

## 2023-04-27 LAB — HIV ANTIBODY (ROUTINE TESTING W REFLEX): HIV Screen 4th Generation wRfx: NONREACTIVE

## 2023-04-27 LAB — CULTURE, BLOOD (SINGLE): Culture: NO GROWTH

## 2023-04-27 LAB — BRAIN NATRIURETIC PEPTIDE: B Natriuretic Peptide: 161.9 pg/mL — ABNORMAL HIGH (ref 0.0–100.0)

## 2023-04-27 LAB — MAGNESIUM: Magnesium: 2.1 mg/dL (ref 1.7–2.4)

## 2023-04-27 LAB — LACTIC ACID, PLASMA: Lactic Acid, Venous: 1.9 mmol/L (ref 0.5–1.9)

## 2023-04-27 MED ORDER — SODIUM CHLORIDE 0.9 % IV SOLN
2.0000 g | INTRAVENOUS | Status: DC
  Administered 2023-04-27 – 2023-04-29 (×3): 2 g via INTRAVENOUS
  Filled 2023-04-27 (×5): qty 20

## 2023-04-27 MED ORDER — DIPHENHYDRAMINE HCL 50 MG/ML IJ SOLN
INTRAMUSCULAR | Status: AC
  Filled 2023-04-27: qty 1

## 2023-04-27 MED ORDER — POTASSIUM CHLORIDE CRYS ER 20 MEQ PO TBCR
40.0000 meq | EXTENDED_RELEASE_TABLET | Freq: Once | ORAL | Status: AC
  Administered 2023-04-27: 40 meq via ORAL
  Filled 2023-04-27: qty 2

## 2023-04-27 MED ORDER — DM-GUAIFENESIN ER 30-600 MG PO TB12
1.0000 | ORAL_TABLET | Freq: Two times a day (BID) | ORAL | Status: DC
  Administered 2023-04-27 – 2023-04-30 (×7): 1 via ORAL
  Filled 2023-04-27 (×7): qty 1

## 2023-04-27 MED ORDER — SODIUM CHLORIDE 0.9 % IV SOLN
1.0000 g | INTRAVENOUS | Status: DC
  Filled 2023-04-27: qty 10

## 2023-04-27 MED ORDER — IPRATROPIUM-ALBUTEROL 0.5-2.5 (3) MG/3ML IN SOLN
3.0000 mL | Freq: Two times a day (BID) | RESPIRATORY_TRACT | Status: DC
  Administered 2023-04-27 – 2023-04-28 (×2): 3 mL via RESPIRATORY_TRACT
  Filled 2023-04-27 (×2): qty 3

## 2023-04-27 NOTE — Progress Notes (Signed)
PROGRESS NOTE    Sue Wong  ZHY:[id number] DOB: Oct 14, 1966 DOA: 04/26/2023 PCP: Lynnea Ferrier, MD   Brief Narrative:  57 y.o. female with medical history significant of breast cancer status postmastectomy with residual right upper extremity lymphedema admitted to the hospital for bilateral chest wall cellulitis.  CT of the chest did not show any intrathoracic pathology.  She does have previous history of breast implants   Assessment & Plan:  Principal Problem:   Sepsis (HCC) Active Problems:   Cellulitis   Lymphedema    Severe sepsis secondary to bilateral breast/chest wall cellulitis, right greater than left Lactic acidosis - Sepsis physiology is improving.  Concerns about reaction to vancomycin overnight.  Currently on IV Rocephin.  Continue IV fluids.  CT chest does not show any deeper infection.  Continue to follow culture data   History of breast cancer - Status postmastectomy with residual right upper extremity lymphedema.  Has breast implants in place   Only meds she will be takes at home currently has Tylenol for osteoarthritis/psoriatic arthritis and as needed     DVT prophylaxis: Lovenox Code Status: Full code Family Communication:   Continue hospital stay for IV antibiotics Hopefully home in 24-48 hours      Diet Orders (From admission, onward)     Start     Ordered   04/26/23 1740  Diet regular Room service appropriate? Yes; Fluid consistency: Thin  Diet effective now       Question Answer Comment  Room service appropriate? Yes   Fluid consistency: Thin      04/26/23 1740            Subjective:  Still has erythema of her bilateral chest wall but appears to have improved on the left side and right back  Examination:  General exam: Appears calm and comfortable  Respiratory system: Clear to auscultation. Respiratory effort normal. Cardiovascular system: S1 & S2 heard, RRR. No JVD, murmurs, rubs, gallops or clicks. No pedal  edema. Gastrointestinal system: Abdomen is nondistended, soft and nontender. No organomegaly or masses felt. Normal bowel sounds heard. Central nervous system: Alert and oriented. No focal neurological deficits. Extremities: Symmetric 5 x 5 power. Skin: Bilateral erythema of the chest wall especially over her right breast going towards right axilla and her right upper back.  There is also some erythema of her upper abdomen but improving.  No obvious evidence of fluctuant mass Psychiatry: Judgement and insight appear normal. Mood & affect appropriate. Charge RN in the room with me during my examination  Objective: Vitals:   04/26/23 2000 04/27/23 0021 04/27/23 0142 04/27/23 0502  BP: (!) 105/57 119/72 121/68 122/72  Pulse: (!) 108 93 (!) 106 (!) 104  Resp: 18 18 18 20   Temp: 98.2 F (36.8 C) 98.1 F (36.7 C) 98.5 F (36.9 C) 98.5 F (36.9 C)  TempSrc:      SpO2: 98% 99% 99% 97%  Weight:      Height:        Intake/Output Summary (Last 24 hours) at 04/27/2023 0753 Last data filed at 04/27/2023 0300 Gross per 24 hour  Intake 592.04 ml  Output 2 ml  Net 590.04 ml   Filed Weights   04/26/23 1454  Weight: 76.2 kg    Scheduled Meds:  enoxaparin (LOVENOX) injection  40 mg Subcutaneous Q24H   potassium chloride  40 mEq Oral Once   Continuous Infusions:  sodium chloride 75 mL/hr at 04/26/23 2035   cefTRIAXone (ROCEPHIN)  IV  Nutritional status     Body mass index is 32.81 kg/m.  Data Reviewed:   CBC: Recent Labs  Lab 04/26/23 1500 04/27/23 0541  WBC 17.2* 10.8*  HGB 13.6 11.1*  HCT 41.8 34.1*  MCV 98.4 100.3*  PLT 302 216   Basic Metabolic Panel: Recent Labs  Lab 04/26/23 1500 04/27/23 0541  NA 134* 137  K 4.1 3.6  CL 102 113*  CO2 24 20*  GLUCOSE 130* 125*  BUN 17 14  CREATININE 1.05* 0.88  CALCIUM 9.1 7.6*  MG  --  2.1   GFR: Estimated Creatinine Clearance: 65.1 mL/min (by C-G formula based on SCr of 0.88 mg/dL). Liver Function Tests: Recent  Labs  Lab 04/26/23 1500  AST 26  ALT 23  ALKPHOS 91  BILITOT 0.6  PROT 8.2*  ALBUMIN 4.4   No results for input(s): "LIPASE", "AMYLASE" in the last 168 hours. No results for input(s): "AMMONIA" in the last 168 hours. Coagulation Profile: No results for input(s): "INR", "PROTIME" in the last 168 hours. Cardiac Enzymes: No results for input(s): "CKTOTAL", "CKMB", "CKMBINDEX", "TROPONINI" in the last 168 hours. BNP (last 3 results) No results for input(s): "PROBNP" in the last 8760 hours. HbA1C: No results for input(s): "HGBA1C" in the last 72 hours. CBG: No results for input(s): "GLUCAP" in the last 168 hours. Lipid Profile: No results for input(s): "CHOL", "HDL", "LDLCALC", "TRIG", "CHOLHDL", "LDLDIRECT" in the last 72 hours. Thyroid Function Tests: No results for input(s): "TSH", "T4TOTAL", "FREET4", "T3FREE", "THYROIDAB" in the last 72 hours. Anemia Panel: No results for input(s): "VITAMINB12", "FOLATE", "FERRITIN", "TIBC", "IRON", "RETICCTPCT" in the last 72 hours. Sepsis Labs: Recent Labs  Lab 04/26/23 1500 04/26/23 1624 04/26/23 2049 04/27/23 0058  LATICACIDVEN 2.0* 2.5* 3.3* 1.9    Recent Results (from the past 240 hour(s))  Blood culture (single)     Status: None (Preliminary result)   Collection Time: 04/26/23  3:00 PM   Specimen: BLOOD  Result Value Ref Range Status   Specimen Description BLOOD BLOOD LEFT ARM  Final   Special Requests   Final    BOTTLES DRAWN AEROBIC AND ANAEROBIC Blood Culture adequate volume   Culture   Final    NO GROWTH < 24 HOURS Performed at [facility], [address] [place], [place] [address]    Report Status PENDING  Incomplete  Culture, blood (single)     Status: None (Preliminary result)   Collection Time: 04/26/23  4:24 PM   Specimen: BLOOD  Result Value Ref Range Status   Specimen Description BLOOD BLOOD LEFT ARM  Final   Special Requests   Final    BOTTLES DRAWN AEROBIC AND ANAEROBIC Blood Culture results may  not be optimal due to an inadequate volume of blood received in culture bottles   Culture   Final    NO GROWTH < 24 HOURS Performed at [facility], [address], [place], [place] [address]    Report Status PENDING  Incomplete         Radiology Studies: CT Chest Wo Contrast  Result Date: 04/26/2023 CLINICAL DATA:  Chest wall pain cellulitis to right breast EXAM: CT CHEST WITHOUT CONTRAST TECHNIQUE: Multidetector CT imaging of the chest was performed following the standard protocol without IV contrast. RADIATION DOSE REDUCTION: This exam was performed according to the departmental dose-optimization program which includes automated exposure control, adjustment of the mA and/or kV according to patient size and/or use of iterative reconstruction technique. COMPARISON:  None Available. FINDINGS: Cardiovascular:  Limited evaluation without intravenous contrast. Nonaneurysmal aorta. Normal cardiac size. No pericardial effusion Mediastinum/Nodes: No enlarged mediastinal or axillary lymph nodes. Thyroid gland, trachea, and esophagus demonstrate no significant findings. Lungs/Pleura: Mild fibrosis at the right apex and subpleural right anterior lung likely due to post radiation changes. No acute airspace disease, pleural effusion or pneumothorax. Upper Abdomen: No acute abnormality. Musculoskeletal: Bilateral mastectomy with reconstruction. No acute osseous abnormality. IMPRESSION: 1. No CT evidence for acute intrathoracic abnormality. Electronically Signed   By: Jasmine Pang M.D.   On: 04/26/2023 17:16           LOS: 1 day   Time spent= 35 mins    Lehi Phifer Joline Maxcy, MD [facility]  If 7PM-7AM, please contact night-coverage  04/27/2023, 7:53 AM

## 2023-04-27 NOTE — Consult Note (Addendum)
PHARMACY CONSULT NOTE - FOLLOW UP  Pharmacy Consult for Electrolyte Monitoring and Replacement   Recent Labs: Potassium (mmol/L)  Date Value  04/27/2023 3.6  10/15/2013 3.9   Magnesium (mg/dL)  Date Value  16/09/9603 2.1   Calcium (mg/dL)  Date Value  54/08/8118 7.6 (L)   Calcium, Total (mg/dL)  Date Value  14/78/2956 9.4   Albumin (g/dL)  Date Value  21/30/8657 4.4   Sodium (mmol/L)  Date Value  04/27/2023 137  10/15/2013 140     Assessment: 57 y.o. female with medical history significant of breast cancer status postmastectomy with residual right upper extremity lymphedema comes to the hospital with complaints of erythema which is rapidly spreading from her right breast.   Diet: Thin.   Goal of Therapy:  WNL  Plan:  Medical team ordered Kcl 40 mEq x 1.  F/u with AM labs. Monitor Ca. Trending down.   Ronnald Ramp ,PharmD Clinical Pharmacist 04/27/2023 7:57 AM

## 2023-04-27 NOTE — Progress Notes (Addendum)
Pt lacti acid came up at 3.3 from  2.5. NP Jon Billings made aware. Awaiting result for lactic acid Will continue to monitor.  Update 0058: Lactic resulted at 1.9. NP Jon Billings made aware and no new order place. Will continue to monitor.

## 2023-04-27 NOTE — Plan of Care (Addendum)
Rash/inflammation assessed and outlined per shift assessment.  Outlines placed on neck, bilat chest, ABD, RT arm and Back.  Please assess Q/shift for changes and inform Dr. If patient has any difficulty with swallowing or breathing.   Dr. POC  - Blood cultures drawn 5/22 lunchtime (If remain Neg after 48 hours (5/24 lunchtime) and swelling shows improvement from Rocephin), possible DC home on Keflex late 5/24.   1400 - Patient stating she cannot take deep breath without coughing. Really seems to be ok to me, but out of precaution - since admitted with allergy reaction and current swelling/redness at base of throat,  I did give IV benadryl. She's stating she just feels overly congested - so possibly/hopefully has nothing to do with her current diagnosis ?? Also gave available Guaiphenesin PRN. Dr. Informed. DR. Theressa Millard: Mucinex, Nebs and cxr ordered. Also gave verbal to stop IV fluids.

## 2023-04-28 DIAGNOSIS — L0390 Cellulitis, unspecified: Secondary | ICD-10-CM | POA: Diagnosis not present

## 2023-04-28 DIAGNOSIS — A419 Sepsis, unspecified organism: Secondary | ICD-10-CM | POA: Diagnosis not present

## 2023-04-28 LAB — BASIC METABOLIC PANEL
Anion gap: 6 (ref 5–15)
BUN: 11 mg/dL (ref 6–20)
CO2: 24 mmol/L (ref 22–32)
Calcium: 8.1 mg/dL — ABNORMAL LOW (ref 8.9–10.3)
Chloride: 111 mmol/L (ref 98–111)
Creatinine, Ser: 0.95 mg/dL (ref 0.44–1.00)
GFR, Estimated: >60 mL/min (ref >60)
Glucose, Bld: 94 mg/dL (ref 70–99)
Potassium: 4 mmol/L (ref 3.5–5.1)
Sodium: 141 mmol/L (ref 135–145)

## 2023-04-28 LAB — CBC
HCT: 35.5 % — ABNORMAL LOW (ref 36.0–46.0)
Hemoglobin: 11.6 g/dL — ABNORMAL LOW (ref 12.0–15.0)
MCH: 31.9 pg (ref 26.0–34.0)
MCHC: 32.7 g/dL (ref 30.0–36.0)
MCV: 97.5 fL (ref 80.0–100.0)
Platelets: 242 10*3/uL (ref 150–400)
RBC: 3.64 MIL/uL — ABNORMAL LOW (ref 3.87–5.11)
RDW: 13.2 % (ref 11.5–15.5)
WBC: 7.8 10*3/uL (ref 4.0–10.5)
nRBC: 0 % (ref 0.0–0.2)

## 2023-04-28 LAB — CULTURE, BLOOD (SINGLE)

## 2023-04-28 LAB — MAGNESIUM: Magnesium: 2.2 mg/dL (ref 1.7–2.4)

## 2023-04-28 MED ORDER — TRAMADOL HCL 50 MG PO TABS
50.0000 mg | ORAL_TABLET | Freq: Four times a day (QID) | ORAL | Status: DC | PRN
  Administered 2023-04-28 – 2023-04-29 (×4): 100 mg via ORAL
  Filled 2023-04-28 (×4): qty 2

## 2023-04-28 MED ORDER — CYCLOBENZAPRINE HCL 10 MG PO TABS
10.0000 mg | ORAL_TABLET | Freq: Once | ORAL | Status: AC
  Administered 2023-04-28: 10 mg via ORAL
  Filled 2023-04-28: qty 1

## 2023-04-28 NOTE — Progress Notes (Signed)
  PROGRESS NOTE    Sue Wong  ZOX:[id number] DOB: [birth year] DOA: 04/26/2023 PCP: Lynnea Ferrier, MD  116A/[id number]-AA  LOS: 2 days   Brief hospital course:   Assessment & Plan: 57 y.o. female with medical history significant of breast cancer status postmastectomy with residual right upper extremity lymphedema admitted to the hospital for bilateral chest wall cellulitis.  CT of the chest did not show any intrathoracic pathology.  She does have previous history of breast implants    Severe sepsis  --tachycardia, leukocytosis, elevated lactic acid, source cellulitis  bilateral breast/chest wall cellulitis, right greater than left --CT chest does not show any deeper infection. --started on vanc and ceftriaxone and flagyl.  Concerns about reaction to vancomycin overnight.   --cont ceftriaxone    History of right breast cancer - Status post bilateral mastectomy with residual right upper extremity lymphedema.  Has breast implants in place   DVT prophylaxis: Lovenox SQ Code Status: Full code  Family Communication: mother updated at bedside today Level of care: Med-Surg Dispo:   The patient is from: home Anticipated d/c is to: home Anticipated d/c date is: 2-3 days   Subjective and Interval History:  Pt reported pain under right breast.  Redness improved.   Objective: Vitals:   04/28/23 0509 04/28/23 0736 04/28/23 0807 04/28/23 1645  BP: 117/77  114/64 (!) 110/55  Pulse: 86  (!) 102 93  Resp: 18  16 16   Temp: 98 F (36.7 C)  98.8 F (37.1 C) 98.1 F (36.7 C)  TempSrc: Oral     SpO2: 100% 96% 97% 96%  Weight:      Height:        Intake/Output Summary (Last 24 hours) at 04/28/2023 1838 Last data filed at 04/28/2023 1116 Gross per 24 hour  Intake 220 ml  Output --  Net 220 ml   Filed Weights   04/26/23 1454  Weight: 76.2 kg    Examination:   Constitutional: NAD, AAOx3 HEENT: conjunctivae and lids normal, EOMI CV: No cyanosis.   RESP: normal respiratory  effort, on RA Extremities: No effusions, edema in BLE SKIN: warm, dry. Erythema over right breast receded some from the marked boarder. Neuro: II - XII grossly intact.   Psych: Normal mood and affect.  Appropriate judgement and reason   Data Reviewed: I have personally reviewed labs and imaging studies  Time spent: 35 minutes  Darlin Priestly, MD [facility] If 7PM-7AM, please contact night-coverage 04/28/2023, 6:38 PM

## 2023-04-28 NOTE — Consult Note (Signed)
PHARMACY CONSULT NOTE - FOLLOW UP  Pharmacy Consult for Electrolyte Monitoring and Replacement   Recent Labs: Potassium (mmol/L)  Date Value  04/28/2023 4.0  10/15/2013 3.9   Magnesium (mg/dL)  Date Value  16/09/9603 2.2   Calcium (mg/dL)  Date Value  54/08/8118 8.1 (L)   Calcium, Total (mg/dL)  Date Value  14/78/2956 9.4   Albumin (g/dL)  Date Value  21/30/8657 4.4   Sodium (mmol/L)  Date Value  04/28/2023 141  10/15/2013 140     Assessment: 57 y.o. female with medical history significant of breast cancer status postmastectomy with residual right upper extremity lymphedema comes to the hospital with complaints of erythema which is rapidly spreading from her right breast.   Diet: Thin.   Goal of Therapy:  WNL  Plan:  No electrolyte replacement at this time. F/u with AM labs.   Angelique Blonder ,PharmD Clinical Pharmacist 04/28/2023 10:02 AM

## 2023-04-29 DIAGNOSIS — A419 Sepsis, unspecified organism: Secondary | ICD-10-CM | POA: Diagnosis not present

## 2023-04-29 DIAGNOSIS — L0390 Cellulitis, unspecified: Secondary | ICD-10-CM | POA: Diagnosis not present

## 2023-04-29 LAB — BASIC METABOLIC PANEL
Anion gap: 7 (ref 5–15)
BUN: 12 mg/dL (ref 6–20)
CO2: 24 mmol/L (ref 22–32)
Calcium: 8.6 mg/dL — ABNORMAL LOW (ref 8.9–10.3)
Chloride: 108 mmol/L (ref 98–111)
Creatinine, Ser: 0.93 mg/dL (ref 0.44–1.00)
GFR, Estimated: >60 mL/min (ref >60)
Glucose, Bld: 106 mg/dL — ABNORMAL HIGH (ref 70–99)
Potassium: 4.1 mmol/L (ref 3.5–5.1)
Sodium: 139 mmol/L (ref 135–145)

## 2023-04-29 LAB — CBC
HCT: 37.9 % (ref 36.0–46.0)
Hemoglobin: 12 g/dL (ref 12.0–15.0)
MCH: 31.7 pg (ref 26.0–34.0)
MCHC: 31.7 g/dL (ref 30.0–36.0)
MCV: 100.3 fL — ABNORMAL HIGH (ref 80.0–100.0)
Platelets: 269 10*3/uL (ref 150–400)
RBC: 3.78 MIL/uL — ABNORMAL LOW (ref 3.87–5.11)
RDW: 12.9 % (ref 11.5–15.5)
WBC: 5.7 10*3/uL (ref 4.0–10.5)
nRBC: 0 % (ref 0.0–0.2)

## 2023-04-29 LAB — CULTURE, BLOOD (SINGLE): Culture: NO GROWTH

## 2023-04-29 LAB — MAGNESIUM: Magnesium: 2.1 mg/dL (ref 1.7–2.4)

## 2023-04-29 MED ORDER — CYCLOBENZAPRINE HCL 10 MG PO TABS
10.0000 mg | ORAL_TABLET | Freq: Three times a day (TID) | ORAL | Status: DC | PRN
  Administered 2023-04-29 – 2023-04-30 (×2): 10 mg via ORAL
  Filled 2023-04-29 (×3): qty 1

## 2023-04-29 NOTE — Progress Notes (Signed)
  PROGRESS NOTE    Sue Wong  ZOX:[id number] DOB: May 18, 1966 DOA: 04/26/2023 PCP: Lynnea Ferrier, MD  116A/[id number]-AA  LOS: 3 days   Brief hospital course:   Assessment & Plan: 57 y.o. female with medical history significant of breast cancer status postmastectomy with residual right upper extremity lymphedema admitted to the hospital for bilateral chest wall cellulitis.  CT of the chest did not show any intrathoracic pathology.  She does have previous history of breast implants    Severe sepsis  --tachycardia, leukocytosis, elevated lactic acid, source cellulitis  bilateral breast/chest wall cellulitis, right greater than left --CT chest does not show any deeper infection. --started on vanc and ceftriaxone and flagyl.  Concerns about reaction to vancomycin overnight, vanc d/c'ed. Plan: --cont ceftriaxone (cellulitis is improving)   History of right breast cancer - Status post bilateral mastectomy with residual right upper extremity lymphedema.  Has breast implants in place   DVT prophylaxis: Lovenox SQ Code Status: Full code  Family Communication:  Level of care: Med-Surg Dispo:   The patient is from: home Anticipated d/c is to: home Anticipated d/c date is: 1-2 days   Subjective and Interval History:  Still some pain under the right breast.  Redness improved.   Objective: Vitals:   04/28/23 1645 04/28/23 1936 04/29/23 0622 04/29/23 0726  BP: (!) 110/55 110/87 120/76 120/80  Pulse: 93 98 82 82  Resp: 16 18 18 18   Temp: 98.1 F (36.7 C) 98.5 F (36.9 C) 98.7 F (37.1 C) 98.3 F (36.8 C)  TempSrc:      SpO2: 96% 97% 96% 95%  Weight:      Height:        Intake/Output Summary (Last 24 hours) at 04/29/2023 1538 Last data filed at 04/28/2023 1904 Gross per 24 hour  Intake 0 ml  Output --  Net 0 ml   Filed Weights   04/26/23 1454  Weight: 76.2 kg    Examination:   Constitutional: NAD, AAOx3 HEENT: conjunctivae and lids normal, EOMI CV: No  cyanosis.   RESP: normal respiratory effort, on RA Extremities: No effusions, edema in BLE SKIN: warm, dry.  Erythema over right breast improved Neuro: II - XII grossly intact.   Psych: Normal mood and affect.  Appropriate judgement and reason   Data Reviewed: I have personally reviewed labs and imaging studies  Time spent: 35 minutes  Darlin Priestly, MD [facility] If 7PM-7AM, please contact night-coverage 04/29/2023, 3:38 PM

## 2023-04-29 NOTE — Consult Note (Signed)
PHARMACY CONSULT NOTE  Pharmacy Consult for Electrolyte Monitoring and Replacement   Recent Labs: Potassium (mmol/L)  Date Value  04/29/2023 4.1  10/15/2013 3.9   Magnesium (mg/dL)  Date Value  16/09/9603 2.1   Calcium (mg/dL)  Date Value  54/08/8118 8.6 (L)   Calcium, Total (mg/dL)  Date Value  14/78/2956 9.4   Albumin (g/dL)  Date Value  21/30/8657 4.4   Sodium (mmol/L)  Date Value  04/29/2023 139  10/15/2013 140   Assessment: 57 y.o. female with medical history significant of breast cancer status postmastectomy with residual right upper extremity lymphedema comes to the hospital with complaints of erythema which is rapidly spreading from her right breast.   Diet: Regular  Goal of Therapy:  Within normal limits  Plan:  --No electrolyte replacement indicated at this time --Follow-up electrolytes with AM labs tomorrow  Tressie Ellis 04/29/2023 7:34 AM

## 2023-04-30 DIAGNOSIS — A419 Sepsis, unspecified organism: Secondary | ICD-10-CM | POA: Diagnosis not present

## 2023-04-30 DIAGNOSIS — L0390 Cellulitis, unspecified: Secondary | ICD-10-CM | POA: Diagnosis not present

## 2023-04-30 LAB — CBC
HCT: 39.4 % (ref 36.0–46.0)
Hemoglobin: 12.8 g/dL (ref 12.0–15.0)
MCH: 31.8 pg (ref 26.0–34.0)
MCHC: 32.5 g/dL (ref 30.0–36.0)
MCV: 98 fL (ref 80.0–100.0)
Platelets: 312 10*3/uL (ref 150–400)
RBC: 4.02 MIL/uL (ref 3.87–5.11)
RDW: 12.5 % (ref 11.5–15.5)
WBC: 5.6 10*3/uL (ref 4.0–10.5)
nRBC: 0 % (ref 0.0–0.2)

## 2023-04-30 LAB — BASIC METABOLIC PANEL
Anion gap: 7 (ref 5–15)
BUN: 14 mg/dL (ref 6–20)
CO2: 27 mmol/L (ref 22–32)
Calcium: 8.3 mg/dL — ABNORMAL LOW (ref 8.9–10.3)
Chloride: 101 mmol/L (ref 98–111)
Creatinine, Ser: 0.93 mg/dL (ref 0.44–1.00)
GFR, Estimated: >60 mL/min (ref >60)
Glucose, Bld: 98 mg/dL (ref 70–99)
Potassium: 3.9 mmol/L (ref 3.5–5.1)
Sodium: 135 mmol/L (ref 135–145)

## 2023-04-30 LAB — CULTURE, BLOOD (SINGLE): Special Requests: ADEQUATE

## 2023-04-30 LAB — MAGNESIUM: Magnesium: 2.1 mg/dL (ref 1.7–2.4)

## 2023-04-30 MED ORDER — CEFADROXIL 500 MG PO CAPS: 500.0000 mg | ORAL_CAPSULE | Freq: Two times a day (BID) | ORAL | 0 refills | Status: DC

## 2023-04-30 MED ORDER — CEFTRIAXONE SODIUM 1 G IJ SOLR
2.0000 g | Freq: Once | INTRAMUSCULAR | Status: AC
  Administered 2023-04-30: 2 g via INTRAMUSCULAR
  Filled 2023-04-30: qty 20

## 2023-04-30 NOTE — Consult Note (Signed)
PHARMACY CONSULT NOTE  Pharmacy Consult for Electrolyte Monitoring and Replacement   Recent Labs: Potassium (mmol/L)  Date Value  04/30/2023 3.9  10/15/2013 3.9   Magnesium (mg/dL)  Date Value  16/09/9603 2.1   Calcium (mg/dL)  Date Value  54/08/8118 8.3 (L)   Calcium, Total (mg/dL)  Date Value  14/78/2956 9.4   Albumin (g/dL)  Date Value  21/30/8657 4.4   Sodium (mmol/L)  Date Value  04/30/2023 135  10/15/2013 140   Assessment: 57 y.o. female with medical history significant of breast cancer status postmastectomy with residual right upper extremity lymphedema comes to the hospital with complaints of erythema which is rapidly spreading from her right breast.   Diet: Regular  Goal of Therapy:  Within normal limits  Plan:  --No electrolyte replacement indicated at this time. Electrolytes have been stable and not requiring replacement. Will discontinue electrolyte consult at this time. Defer further ordering of labs and electrolyte replacement to primary team --Pharmacy will continue to follow along peripherally  Tressie Ellis 04/30/2023 7:21 AM

## 2023-04-30 NOTE — Progress Notes (Signed)
A consult was placed to the IV Nurse for new IV access;  pt limited to left arm only due to lymphedema; attempted x 2 in left arm, using ultrasound; unable to thread catheters; pt has had chemo in the past; veins do not compress easily;  RN and MD aware; MD to see pt .   No further attempts made at this time.

## 2023-04-30 NOTE — Progress Notes (Signed)
Patient does not have a legal guardian  

## 2023-04-30 NOTE — Discharge Summary (Signed)
Physician Discharge Summary   Sue Wong  female DOB: 23-Jan-1966  BJY:[id number]  PCP: Lynnea Ferrier, MD  Admit date: 04/26/2023 Discharge date: 04/30/2023  Admitted From: home Disposition:  home CODE STATUS: Full code  Discharge Instructions     Discharge instructions   Complete by: As directed    You have received 5 days of IV antibiotic for cellulitis.  Please finish 5 more days of oral cefadroxil at home starting tomorrow 05/01/23.   Dr. Darlin Priestly [facility] Course:  For full details, please see H&P, progress notes, consult notes and ancillary notes.  Briefly,  Sue Wong is a 57 y.o. female with medical history significant of breast cancer status post mastectomy with resulting right upper extremity lymphedema admitted to the hospital for bilateral chest wall cellulitis.  CT of the chest did not show any intrathoracic pathology.  She does have breast implants placed after mastectomy.   Severe sepsis  --tachycardia, leukocytosis, elevated lactic acid, source cellulitis   bilateral breast/chest wall cellulitis, right greater than left --CT chest does not show any deeper infection. --started on vanc and ceftriaxone and flagyl.  Concerns about reaction to vancomycin overnight, vanc d/c'ed. --received 5 days of ceftriaxone with improvement.  Pt was discharged on 5 more days of cefadroxil.    Unless noted above, medications under "STOP" list are ones pt was not taking PTA.  Discharge Diagnoses:  Principal Problem:   Sepsis (HCC) Active Problems:   Cellulitis   Lymphedema   30 Day Unplanned Readmission Risk Score    Flowsheet Row ED to Hosp-Admission (Current) from 04/26/2023 in [facility] 1C MEDICAL TELEMETRY  30 Day Unplanned Readmission Risk Score (%) 8.49 Filed at 04/30/2023 1200       This score is the patient's risk of an unplanned readmission within 30 days of being discharged (0 -100%). The score is based on  dignosis, age, lab data, medications, orders, and past utilization.   Low:  0-14.9   Medium: 15-21.9   High: 22-29.9   Extreme: 30 and above         Discharge Instructions:  Allergies as of 04/30/2023       Reactions   Codeine Itching   Severe itching    Oxycodone Itching   Severe itching   Vancomycin Itching, Rash   Not vancomycin infusion reaction syndrome, concern for true allergy per MD        Medication List     STOP taking these medications    Calcium 600-D 600-400 MG-UNIT Tabs Generic drug: Calcium Carbonate-Vitamin D3   fluticasone 50 MCG/ACT nasal spray Commonly known as: FLONASE   letrozole 2.5 MG tablet Commonly known as: FEMARA   venlafaxine XR 75 MG 24 hr capsule Commonly known as: EFFEXOR-XR       TAKE these medications    cefadroxil 500 MG capsule Commonly known as: DURICEF Take 1 capsule (500 mg total) by mouth 2 (two) times daily for 5 days. Start taking on: May 01, 2023   cyanocobalamin 1000 MCG tablet Commonly known as: VITAMIN B12 Take 1,000 mcg by mouth daily.   pantoprazole 40 MG tablet Commonly known as: PROTONIX Take 40 mg by mouth daily as needed (acid reflux symptoms).         Follow-up Information     Curtis Sites III, MD Follow up in 1 week(s).   Specialty: Internal Medicine Contact information: [address] [facility]  [place] [address] [phone]                 Allergies  Allergen Reactions   Codeine Itching    Severe itching    Oxycodone Itching    Severe itching   Vancomycin Itching and Rash    Not vancomycin infusion reaction syndrome, concern for true allergy per MD     The results of significant diagnostics from this hospitalization (including imaging, microbiology, ancillary and laboratory) are listed below for reference.   Consultations:   Procedures/Studies: DG Chest Port 1 View  Result Date: 04/27/2023 CLINICAL DATA:  [id number] Dyspnea [id number] EXAM: PORTABLE  CHEST - 1 VIEW COMPARISON:  10/15/2013 FINDINGS: Lower lung volumes with patchy airspace opacities in the lung bases left greater than right. Heart size and mediastinal contours are within normal limits. No effusion. Visualized bones unremarkable. IMPRESSION: Patchy bibasilar airspace disease, left greater than right. Electronically Signed   By: Corlis Leak M.D.   On: 04/27/2023 14:52   CT Chest Wo Contrast  Result Date: 04/26/2023 CLINICAL DATA:  Chest wall pain cellulitis to right breast EXAM: CT CHEST WITHOUT CONTRAST TECHNIQUE: Multidetector CT imaging of the chest was performed following the standard protocol without IV contrast. RADIATION DOSE REDUCTION: This exam was performed according to the departmental dose-optimization program which includes automated exposure control, adjustment of the mA and/or kV according to patient size and/or use of iterative reconstruction technique. COMPARISON:  None Available. FINDINGS: Cardiovascular: Limited evaluation without intravenous contrast. Nonaneurysmal aorta. Normal cardiac size. No pericardial effusion Mediastinum/Nodes: No enlarged mediastinal or axillary lymph nodes. Thyroid gland, trachea, and esophagus demonstrate no significant findings. Lungs/Pleura: Mild fibrosis at the right apex and subpleural right anterior lung likely due to post radiation changes. No acute airspace disease, pleural effusion or pneumothorax. Upper Abdomen: No acute abnormality. Musculoskeletal: Bilateral mastectomy with reconstruction. No acute osseous abnormality. IMPRESSION: 1. No CT evidence for acute intrathoracic abnormality. Electronically Signed   By: Jasmine Pang M.D.   On: 04/26/2023 17:16      Labs: BNP (last 3 results) Recent Labs    04/27/23 0541  BNP 161.9*   Basic Metabolic Panel: Recent Labs  Lab 04/26/23 1500 04/27/23 0541 04/28/23 0525 04/29/23 0500 04/30/23 0500  NA 134* 137 141 139 135  K 4.1 3.6 4.0 4.1 3.9  CL 102 113* 111 108 101  CO2 24 20*  24 24 27   GLUCOSE 130* 125* 94 106* 98  BUN 17 14 11 12 14   CREATININE 1.05* 0.88 0.95 0.93 0.93  CALCIUM 9.1 7.6* 8.1* 8.6* 8.3*  MG  --  2.1 2.2 2.1 2.1   Liver Function Tests: Recent Labs  Lab 04/26/23 1500  AST 26  ALT 23  ALKPHOS 91  BILITOT 0.6  PROT 8.2*  ALBUMIN 4.4   No results for input(s): "LIPASE", "AMYLASE" in the last 168 hours. No results for input(s): "AMMONIA" in the last 168 hours. CBC: Recent Labs  Lab 04/26/23 1500 04/27/23 0541 04/28/23 0525 04/29/23 0500 04/30/23 0500  WBC 17.2* 10.8* 7.8 5.7 5.6  HGB 13.6 11.1* 11.6* 12.0 12.8  HCT 41.8 34.1* 35.5* 37.9 39.4  MCV 98.4 100.3* 97.5 100.3* 98.0  PLT 302 216 242 269 312   Cardiac Enzymes: No results for input(s): "CKTOTAL", "CKMB", "CKMBINDEX", "TROPONINI" in the last 168 hours. BNP: Invalid input(s): "POCBNP" CBG: No results for input(s): "GLUCAP" in the last 168 hours. D-Dimer No results for input(s): "DDIMER" in the last 72 hours. Hgb A1c No results for input(s): "  HGBA1C" in the last 72 hours. Lipid Profile No results for input(s): "CHOL", "HDL", "LDLCALC", "TRIG", "CHOLHDL", "LDLDIRECT" in the last 72 hours. Thyroid function studies No results for input(s): "TSH", "T4TOTAL", "T3FREE", "THYROIDAB" in the last 72 hours.  Invalid input(s): "FREET3" Anemia work up No results for input(s): "VITAMINB12", "FOLATE", "FERRITIN", "TIBC", "IRON", "RETICCTPCT" in the last 72 hours. Urinalysis No results found for: "COLORURINE", "APPEARANCEUR", "LABSPEC", "PHURINE", "GLUCOSEU", "HGBUR", "BILIRUBINUR", "KETONESUR", "PROTEINUR", "UROBILINOGEN", "NITRITE", "LEUKOCYTESUR" Sepsis Labs Recent Labs  Lab 04/27/23 0541 04/28/23 0525 04/29/23 0500 04/30/23 0500  WBC 10.8* 7.8 5.7 5.6   Microbiology Recent Results (from the past 240 hour(s))  Blood culture (single)     Status: None (Preliminary result)   Collection Time: 04/26/23  3:00 PM   Specimen: BLOOD  Result Value Ref Range Status   Specimen  Description BLOOD BLOOD LEFT ARM  Final   Special Requests   Final    BOTTLES DRAWN AEROBIC AND ANAEROBIC Blood Culture adequate volume   Culture   Final    NO GROWTH 4 DAYS Performed at [facility], [address] [place], [place] [address]    Report Status PENDING  Incomplete  Culture, blood (single)     Status: None (Preliminary result)   Collection Time: 04/26/23  4:24 PM   Specimen: BLOOD  Result Value Ref Range Status   Specimen Description BLOOD BLOOD LEFT ARM  Final   Special Requests   Final    BOTTLES DRAWN AEROBIC AND ANAEROBIC Blood Culture results may not be optimal due to an inadequate volume of blood received in culture bottles   Culture   Final    NO GROWTH 4 DAYS Performed at [facility], [address] [place], [place] [address]    Report Status PENDING  Incomplete     Total time spend on discharging this patient, including the last patient exam, discussing the hospital stay, instructions for ongoing care as it relates to all pertinent caregivers, as well as preparing the medical discharge records, prescriptions, and/or referrals as applicable, is 40 minutes.    Darlin Priestly, MD  [facility] 04/30/2023, 12:17 PM

## 2023-05-01 ENCOUNTER — Other Ambulatory Visit: Payer: Self-pay | Admitting: Hospitalist

## 2023-05-01 LAB — CULTURE, BLOOD (SINGLE)

## 2023-05-01 MED ORDER — CEFADROXIL 500 MG PO CAPS: 500.0000 mg | ORAL_CAPSULE | Freq: Two times a day (BID) | ORAL | 0 refills | Status: AC

## 2024-01-08 ENCOUNTER — Emergency Department
Admission: EM | Admit: 2024-01-08 | Discharge: 2024-01-08 | Discharge status: Against Medical Advice | Payer: BC Managed Care – PPO

## 2024-01-08 NOTE — ED Notes (Signed)
Patient sent over via [facility] due to ongoing cellulitis of right arm, on Doxycycline x 4 days without any improvement.  Patient A/Ox4, NAD noted at this time.

## 2024-05-20 ENCOUNTER — Other Ambulatory Visit: Payer: Self-pay

## 2024-05-20 ENCOUNTER — Encounter: Payer: Self-pay | Admitting: Rehabilitation

## 2024-05-20 ENCOUNTER — Ambulatory Visit: Attending: Internal Medicine | Admitting: Rehabilitation

## 2024-05-20 DIAGNOSIS — Z9013 Acquired absence of bilateral breasts and nipples: Secondary | ICD-10-CM | POA: Insufficient documentation

## 2024-05-20 DIAGNOSIS — I972 Postmastectomy lymphedema syndrome: Secondary | ICD-10-CM | POA: Diagnosis not present

## 2024-05-20 DIAGNOSIS — M25612 Stiffness of left shoulder, not elsewhere classified: Secondary | ICD-10-CM | POA: Diagnosis not present

## 2024-05-20 DIAGNOSIS — M25611 Stiffness of right shoulder, not elsewhere classified: Secondary | ICD-10-CM

## 2024-05-20 DIAGNOSIS — I890 Lymphedema, not elsewhere classified: Secondary | ICD-10-CM | POA: Diagnosis present

## 2024-05-20 NOTE — Therapy (Unsigned)
 OUTPATIENT PHYSICAL THERAPY  UPPER EXTREMITY ONCOLOGY EVALUATION  Patient Name: Sue Wong MRN: [id number] DOB:August 07, 1966, 58 y.o., female Today's Date: 05/21/2024  END OF SESSION:  PT End of Session - 05/21/24 0948     Visit Number 1    Number of Visits 25    Date for PT Re-Evaluation 07/30/24    Authorization Type needed    PT Start Time 1205    PT Stop Time 1304    PT Time Calculation (min) 59 min    Activity Tolerance Patient tolerated treatment well    Behavior During Therapy [facility] for tasks assessed/performed          Past Medical History:  Diagnosis Date   Arthritis    Breast cancer (HCC)    GERD (gastroesophageal reflux disease)    Psoriasis    [place] spotted fever    Past Surgical History:  Procedure Laterality Date   BREAST SURGERY Bilateral 1/9 and 12/27/16   Patient Active Problem List   Diagnosis Date Noted   Sepsis (HCC) 04/26/2023   Cellulitis 06/04/2020   Fever 06/04/2020   Lymphedema 06/04/2020    REFERRING PROVIDER: Eddy Goodell III, MD  REFERRING DIAG: I89.0  THERAPY DIAG:  Postmastectomy lymphedema syndrome  Stiffness of left shoulder, not elsewhere classified  ONSET DATE: 2018  Rationale for Evaluation and Treatment: Rehabilitation  SUBJECTIVE:                                                                                                                                                                                           SUBJECTIVE STATEMENT: Tried wrapping but found it too challenging to maintain and even painful.  I have a night garment but it stops at my hand.  I outgrew my pump and need to get a new one.    PERTINENT HISTORY: Hx of Rt breast cancer metastasized to LN with bil mastectomy 12/13/2016 and then complete lymphadenectomy 12/27/16,  radiation and chemotherapy, hx of recurrent cellulitis Rt UE, psoriatic arthritis  PAIN:  Are you having pain? No not right now.  Some days my forearm will hurt  NPRS scale: up  to 8/10 Pain location: Rt forearm  Pain orientation: Right  PAIN TYPE: aching and burning Pain description: intermittent  Aggravating factors: activity Relieving factors: moving around.    PRECAUTIONS: recurrent Rt arm cellulitis   RED FLAGS: None   WEIGHT BEARING RESTRICTIONS: No  FALLS:  Has patient fallen in last 6 months? No  LIVING ENVIRONMENT: Lives with: lives with their family  OCCUPATION: Not working now.  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  LEISURE:   HAND DOMINANCE: right   PRIOR LEVEL OF FUNCTION: Independent  PATIENT GOALS: try to treat this arm   OBJECTIVE: Note: Objective measures were completed at Evaluation unless otherwise noted.  COGNITION: Overall cognitive status: Within functional limits for tasks assessed   PALPATION: +1 pitting Rt dorsal hand and forearm, no pitting in the upper arm, tightness in the Rt axilla and pectoralis region  OBSERVATIONS / OTHER ASSESSMENTS: Rt arm larger with adipose appearance. Post radiation changes to Rt axilla   UPPER EXTREMITY AROM/PROM:  A/PROM RIGHT   eval   Shoulder extension   Shoulder flexion 145  Shoulder abduction   Shoulder internal rotation   Shoulder external rotation     (Blank rows = not tested)  A/PROM LEFT   eval  Shoulder extension   Shoulder flexion 160  Shoulder abduction   Shoulder internal rotation   Shoulder external rotation     (Blank rows = not tested)  LYMPHEDEMA ASSESSMENTS:   LANDMARK RIGHT  Previous visits with OT 05/20/24  At axilla     15 cm proximal to olecranon process 38 39.7  10 cm proximal to olecranon process 37.5 40.5  Olecranon process 33 37.5  15 cm proximal to ulnar styloid process 33.5 37.9  10 cm proximal to ulnar styloid process 30 34.1  Just proximal to ulnar styloid process 21.8 21.2  Across hand at thumb web space 20 21.5  At base of 2nd digit  7.5  (Blank rows = not tested)  LANDMARK LEFT  eval  At axilla    15  cm proximal to olecranon process 38.4  10 cm proximal to olecranon process 37.3  Olecranon process 32  15 cm proximal to ulnar styloid process 30.2  10 cm proximal to ulnar styloid process 27.3  Just proximal to ulnar styloid process 20.2  Across hand at thumb web space 20.1  At base of 2nd digit 7.3  (Blank rows = not tested)  Lt: Rt: Difference of:  8.78ml length                                                                                                                             TREATMENT DATE:  05/20/24 Eval performed - discussed treatment options Roslynn Coombes, PTA was available to cut foam at the end of the session while PT scheduled follow up visits.     PATIENT EDUCATION:  Education details: per today's note Person educated: Patient Education method: Explanation Education comprehension: verbalized understanding  HOME EXERCISE PROGRAM:   ASSESSMENT:  CLINICAL IMPRESSION: Patient is a 58 y.o. female who was seen today for physical therapy evaluation and treatment for her chronic Rt UE lymphedema and recurrent cellulitis.  She has had sporadic treatment since onset in 2018-2019 including bandaging, circular knit sleeves, flexitouch pump, and night garments, but with no long term success due to pain, discomfort, and lack of knowledge of condition.  Decided with patient that we  will start at the beginning and perform full CDT to see how much reduction we can get with plan for switch to flat knit garments.  She is unable to work due to swelling and would like to be able to do more activities like pickleball without swelling too much.    OBJECTIVE IMPAIRMENTS: decreased knowledge of condition, decreased knowledge of use of DME, decreased mobility, decreased ROM, increased edema, and increased fascial restrictions.   ACTIVITY LIMITATIONS: carrying and lifting  PARTICIPATION LIMITATIONS: meal prep, cleaning, community activity, and  occupation  PERSONAL FACTORS: Time since onset of injury/illness/exacerbation and 1-2 comorbidities: complete ALND, radiation hx are also affecting patient's functional outcome.   REHAB POTENTIAL: Excellent  CLINICAL DECISION MAKING: Stable/uncomplicated  EVALUATION COMPLEXITY: Low  GOALS: Goals reviewed with patient? Yes  SHORT TERM GOALS: Target date: 06/28/24  Pt will be ind with self compression bandaging if able Baseline: Goal status: INITIAL  2.  Pt will be ind with self MLD for the Rt UE Baseline:  Goal status: INITIAL  3.  Pt will be educated on cellulitis and lymphedema risk reduction  Baseline:  Goal status: INITIAL   LONG TERM GOALS: Target date: 07/30/24  Pt will be measured for or obtain correct flat knit garments for long term maintenance  Baseline:  Goal status: INITIAL  2.  Pt will be ind with self MLD or pursue a new pump for long term maintenance Baseline:  Goal status: INITIAL    PLAN:  PT FREQUENCY: 2-3x per week   PT DURATION: 10 weeks  PLANNED INTERVENTIONS: 97164- PT Re-evaluation, 97110-Therapeutic exercises, 97530- Therapeutic activity, 97112- Neuromuscular re-education, 97535- Self Care, [address]- Manual therapy, Patient/Family education, Balance training, Joint mobilization, Therapeutic exercises, Therapeutic activity, Neuromuscular re-education, Gait training, and Self Care  PLAN FOR NEXT SESSION: start Rt UE CDT, educate on best practices for cellulitis reduction, teach remedial exercise, include some stretching as able.   Encarnacion Harris, PT 05/21/2024, 9:49 AM

## 2024-05-24 ENCOUNTER — Ambulatory Visit

## 2024-05-24 DIAGNOSIS — I972 Postmastectomy lymphedema syndrome: Secondary | ICD-10-CM

## 2024-05-24 DIAGNOSIS — M25612 Stiffness of left shoulder, not elsewhere classified: Secondary | ICD-10-CM

## 2024-05-24 DIAGNOSIS — M25611 Stiffness of right shoulder, not elsewhere classified: Secondary | ICD-10-CM

## 2024-05-24 NOTE — Therapy (Signed)
 OUTPATIENT PHYSICAL THERAPY  UPPER EXTREMITY ONCOLOGY TREATMENT  Patient Name: Sue Wong MRN: [id number] DOB:1966-09-25, 58 y.o., female Today's Date: 05/24/2024  END OF SESSION:  PT End of Session - 05/24/24 1012     Visit Number 2    Number of Visits 25    Date for PT Re-Evaluation 07/30/24    Authorization Type needed    PT Start Time 1005    PT Stop Time 1104    PT Time Calculation (min) 59 min    Activity Tolerance Patient tolerated treatment well    Behavior During Therapy [facility] for tasks assessed/performed          Past Medical History:  Diagnosis Date   Arthritis    Breast cancer (HCC)    GERD (gastroesophageal reflux disease)    Psoriasis    [place] spotted fever    Past Surgical History:  Procedure Laterality Date   BREAST SURGERY Bilateral 1/9 and 12/27/16   Patient Active Problem List   Diagnosis Date Noted   Sepsis (HCC) 04/26/2023   Cellulitis 06/04/2020   Fever 06/04/2020   Lymphedema 06/04/2020    REFERRING PROVIDER: Eddy Goodell III, MD  REFERRING DIAG: I89.0  THERAPY DIAG:  Postmastectomy lymphedema syndrome  Stiffness of left shoulder, not elsewhere classified  ONSET DATE: 2018  Rationale for Evaluation and Treatment: Rehabilitation  SUBJECTIVE:                                                                                                                                                                                           SUBJECTIVE STATEMENT: I'm ready to get going with getting my arm smaller!   PERTINENT HISTORY: Hx of Rt breast cancer metastasized to LN with bil mastectomy 12/13/2016 and then complete lymphadenectomy 12/27/16,  radiation and chemotherapy, hx of recurrent cellulitis Rt UE, psoriatic arthritis  PAIN:  Are you having pain? No not right now.     PRECAUTIONS: recurrent Rt arm cellulitis   RED FLAGS: None   WEIGHT BEARING RESTRICTIONS: No  FALLS:  Has patient fallen in last 6 months? No  LIVING  ENVIRONMENT: Lives with: lives with their family  OCCUPATION: Not working now.  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    LEISURE:   HAND DOMINANCE: right   PRIOR LEVEL OF FUNCTION: Independent  PATIENT GOALS: try to treat this arm   OBJECTIVE: Note: Objective measures were completed at Evaluation unless otherwise noted.  COGNITION: Overall cognitive status: Within functional limits for tasks assessed   PALPATION: +1 pitting Rt dorsal hand and forearm, no pitting in the upper arm, tightness in  the Rt axilla and pectoralis region  OBSERVATIONS / OTHER ASSESSMENTS: Rt arm larger with adipose appearance. Post radiation changes to Rt axilla   UPPER EXTREMITY AROM/PROM:  A/PROM RIGHT   eval   Shoulder extension   Shoulder flexion 145  Shoulder abduction   Shoulder internal rotation   Shoulder external rotation     (Blank rows = not tested)  A/PROM LEFT   eval  Shoulder extension   Shoulder flexion 160  Shoulder abduction   Shoulder internal rotation   Shoulder external rotation     (Blank rows = not tested)  LYMPHEDEMA ASSESSMENTS:   LANDMARK RIGHT  Previous visits with OT 05/20/24  At axilla     15 cm proximal to olecranon process 38 39.7  10 cm proximal to olecranon process 37.5 40.5  Olecranon process 33 37.5  15 cm proximal to ulnar styloid process 33.5 37.9  10 cm proximal to ulnar styloid process 30 34.1  Just proximal to ulnar styloid process 21.8 21.2  Across hand at thumb web space 20 21.5  At base of 2nd digit  7.5  (Blank rows = not tested)  LANDMARK LEFT  eval  At axilla    15 cm proximal to olecranon process 38.4  10 cm proximal to olecranon process 37.3  Olecranon process 32  15 cm proximal to ulnar styloid process 30.2  10 cm proximal to ulnar styloid process 27.3  Just proximal to ulnar styloid process 20.2  Across hand at thumb web space 20.1  At base of 2nd digit 7.3  (Blank rows = not tested)  Lt: Rt:  Difference of:  8.77ml length                                                                                                                             TREATMENT DATE:  05/24/24: Self Care Spent time at beginning of session educating pt on basics of anatomy of the lymphatic system and basic principles of MLD. Once bandages on also instructed her in remedial exercises (open/close fist, elbow flex/ext and shoulder A/ROM) to perform while wearing bandages to allow for improved lymphatic flow.  Manual Therapy MLD to Rt UE: Short neck, superficial and deep abdominals, Lt axillary and pectoral nodes, anterior intact thorax sequence, anterior inter-axillary anastomosis, Rt inguinal nodes, Rt axillo-inguinal anastomosis, then Rt UE working from proximal to distal  (lateral upper arm, inner to outer and lateral again; antecubital fossa, ant/post forearm and dorsal wrist/hand and fingers) and retracing all steps back to anastomosis and lymph nodes.  Compression Bandaging to Rt UE: Cocoa butter, TG soft, artiflex to elbow, 1/4 gray foam to lower and upper arm fixated with Idealbinde, Molelast to fingers 1-4, artiflex to hand, then short stretch compression bandages as follows: 1- 6 cm to hand/wrist with 1/2 gray foam to dorsal hand/wrist, 1- 10 cm spiral with X at elbow, 1- 12 cm herring bone fashion from wrist to upper arm, then  last 12 cm sprial from wrist to axilla and folded TG soft over top of bandage.   05/20/24 Eval performed - discussed treatment options Roslynn Coombes, PTA was available to cut foam at the end of the session while PT scheduled follow up visits.     PATIENT EDUCATION:  Education details: per today's note Person educated: Patient Education method: Explanation Education comprehension: verbalized understanding  HOME EXERCISE PROGRAM:   ASSESSMENT:  CLINICAL IMPRESSION: First session of CDT to Rt UE. Pt was instructed in anatomy of lymphatic system and  principles of MLD so as to know what to expect from treatment. She reports bandage felt comfortable once complete and she is to try to leave it on until it slides down her arm due to reductions, possibly Sunday and knows to bring everything back to her next appt. Pt was also instructed that if she experiences any pain or tingling, as she has when she's been wrapped before elsewhere, that if remedial exercises don't relieve pain, then she needs to remove them. Pt verbalized good understanding.    OBJECTIVE IMPAIRMENTS: decreased knowledge of condition, decreased knowledge of use of DME, decreased mobility, decreased ROM, increased edema, and increased fascial restrictions.   ACTIVITY LIMITATIONS: carrying and lifting  PARTICIPATION LIMITATIONS: meal prep, cleaning, community activity, and occupation  PERSONAL FACTORS: Time since onset of injury/illness/exacerbation and 1-2 comorbidities: complete ALND, radiation hx are also affecting patient's functional outcome.   REHAB POTENTIAL: Excellent  CLINICAL DECISION MAKING: Stable/uncomplicated  EVALUATION COMPLEXITY: Low  GOALS: Goals reviewed with patient? Yes  SHORT TERM GOALS: Target date: 06/28/24  Pt will be ind with self compression bandaging if able Baseline: Goal status: INITIAL  2.  Pt will be ind with self MLD for the Rt UE Baseline:  Goal status: INITIAL  3.  Pt will be educated on cellulitis and lymphedema risk reduction  Baseline:  Goal status: INITIAL   LONG TERM GOALS: Target date: 07/30/24  Pt will be measured for or obtain correct flat knit garments for long term maintenance  Baseline:  Goal status: INITIAL  2.  Pt will be ind with self MLD or pursue a new pump for long term maintenance Baseline:  Goal status: INITIAL    PLAN:  PT FREQUENCY: 2-3x per week   PT DURATION: 10 weeks  PLANNED INTERVENTIONS: 97164- PT Re-evaluation, 97110-Therapeutic exercises, 97530- Therapeutic activity, 97112- Neuromuscular  re-education, 97535- Self Care, [id number]- Manual therapy, Patient/Family education, Balance training, Joint mobilization, Therapeutic exercises, Therapeutic activity, Neuromuscular re-education, Gait training, and Self Care  PLAN FOR NEXT SESSION: How did she tolerate first bandage? Cont Rt UE CDT, educate on best practices for cellulitis reduction, include some stretching as able.   Denyce Flank, PTA 05/24/2024, 12:46 PM

## 2024-05-27 ENCOUNTER — Ambulatory Visit: Payer: Self-pay

## 2024-05-27 DIAGNOSIS — M25612 Stiffness of left shoulder, not elsewhere classified: Secondary | ICD-10-CM

## 2024-05-27 DIAGNOSIS — M25611 Stiffness of right shoulder, not elsewhere classified: Secondary | ICD-10-CM

## 2024-05-27 DIAGNOSIS — I972 Postmastectomy lymphedema syndrome: Secondary | ICD-10-CM | POA: Diagnosis not present

## 2024-05-27 NOTE — Therapy (Signed)
 OUTPATIENT PHYSICAL THERAPY  UPPER EXTREMITY ONCOLOGY TREATMENT  Patient Name: Sue Wong MRN: [id number] DOB:[birth year], 58 y.o., female Today's Date: 05/27/2024  END OF SESSION:  PT End of Session - 05/27/24 1308     Visit Number 3    Number of Visits 25    Date for PT Re-Evaluation 07/30/24    Authorization Type [employer] Approved 13 visits-05/20/2024-08/18/2024    Authorization - Visit Number 2    Authorization - Number of Visits 13    PT Start Time 1206    PT Stop Time 1303    PT Time Calculation (min) 57 min    Activity Tolerance Patient tolerated treatment well    Behavior During Therapy WFL for tasks assessed/performed          Past Medical History:  Diagnosis Date   Arthritis    Breast cancer (HCC)    GERD (gastroesophageal reflux disease)    Psoriasis    [place] spotted fever    Past Surgical History:  Procedure Laterality Date   BREAST SURGERY Bilateral 1/9 and 12/27/16   Patient Active Problem List   Diagnosis Date Noted   Sepsis (HCC) 04/26/2023   Cellulitis 06/04/2020   Fever 06/04/2020   Lymphedema 06/04/2020    REFERRING PROVIDER: Ophelia Sage III, MD  REFERRING DIAG: I89.0  THERAPY DIAG:  Postmastectomy lymphedema syndrome  Stiffness of left shoulder, not elsewhere classified  ONSET DATE: 2018  Rationale for Evaluation and Treatment: Rehabilitation  SUBJECTIVE:                                                                                                                                                                                           SUBJECTIVE STATEMENT: The bandage was really comfortable. I was thankful it didn't hurt at all. The wrist foam started popping out shortly after our visit so I guess my hand was reducing pretty quickly.  PERTINENT HISTORY: Hx of Rt breast cancer metastasized to LN with bil mastectomy 12/13/2016 and then complete lymphadenectomy 12/27/16,  radiation and chemotherapy, hx of recurrent cellulitis  Rt UE, psoriatic arthritis  PAIN:  Are you having pain? No not right now.     PRECAUTIONS: recurrent Rt arm cellulitis   RED FLAGS: None   WEIGHT BEARING RESTRICTIONS: No  FALLS:  Has patient fallen in last 6 months? No  LIVING ENVIRONMENT: Lives with: lives with their family  OCCUPATION: Not working now.  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    LEISURE:   HAND DOMINANCE: right   PRIOR LEVEL OF FUNCTION: Independent  PATIENT GOALS: try to treat this arm  OBJECTIVE: Note: Objective measures were completed at Evaluation unless otherwise noted.  COGNITION: Overall cognitive status: Within functional limits for tasks assessed   PALPATION: +1 pitting Rt dorsal hand and forearm, no pitting in the upper arm, tightness in the Rt axilla and pectoralis region  OBSERVATIONS / OTHER ASSESSMENTS: Rt arm larger with adipose appearance. Post radiation changes to Rt axilla   UPPER EXTREMITY AROM/PROM:  A/PROM RIGHT   eval   Shoulder extension   Shoulder flexion 145  Shoulder abduction   Shoulder internal rotation   Shoulder external rotation     (Blank rows = not tested)  A/PROM LEFT   eval  Shoulder extension   Shoulder flexion 160  Shoulder abduction   Shoulder internal rotation   Shoulder external rotation     (Blank rows = not tested)  LYMPHEDEMA ASSESSMENTS:   LANDMARK RIGHT  Previous visits with OT 05/20/24  At axilla     15 cm proximal to olecranon process 38 39.7  10 cm proximal to olecranon process 37.5 40.5  Olecranon process 33 37.5  15 cm proximal to ulnar styloid process 33.5 37.9  10 cm proximal to ulnar styloid process 30 34.1  Just proximal to ulnar styloid process 21.8 21.2  Across hand at thumb web space 20 21.5  At base of 2nd digit  7.5  (Blank rows = not tested)  LANDMARK LEFT  eval  At axilla    15 cm proximal to olecranon process 38.4  10 cm proximal to olecranon process 37.3  Olecranon process 32  15 cm  proximal to ulnar styloid process 30.2  10 cm proximal to ulnar styloid process 27.3  Just proximal to ulnar styloid process 20.2  Across hand at thumb web space 20.1  At base of 2nd digit 7.3  (Blank rows = not tested)  Lt: Rt: Difference of:  8.61ml length                                                                                                                             TREATMENT DATE:  05/27/24: Manual Therapy MLD to Rt UE: Short neck, superficial and deep abdominals, Lt axillary and pectoral nodes, anterior intact thorax sequence, anterior inter-axillary anastomosis, Rt inguinal nodes, Rt axillo-inguinal anastomosis, then Rt UE working from proximal to distal  (lateral upper arm, inner to outer and lateral again; antecubital fossa, ant/post forearm and dorsal wrist/hand and fingers) and retracing all steps back to anastomosis and lymph nodes. P/ROM to Rt shoulder into flex, abd and D2 with scapular depression by therapist throughout STM to very tight lateral trunk/lats  Compression Bandaging to Rt UE: Cocoa butter, TG soft, artiflex to elbow, 1/4 gray foam to lower and upper arm fixated with Idealbinde, Molelast to fingers 1-4, artiflex to hand, then short stretch compression bandages as follows: 1- 6 cm to hand/wrist with 1/2 gray foam to dorsal hand/wrist, 1- 10 cm spiral with X at elbow, 1- 12  cm herring bone fashion from wrist to upper arm, then last 12 cm sprial from wrist to axilla and folded TG soft over top of bandage. Pt videorecorded this with her cell phone for further reinforcement at home.   05/24/24: Self Care Spent time at beginning of session educating pt on basics of anatomy of the lymphatic system and basic principles of MLD. Once bandages on also instructed her in remedial exercises (open/close fist, elbow flex/ext and shoulder A/ROM) to perform while wearing bandages to allow for improved lymphatic flow.  Manual Therapy MLD to Rt UE:  Short neck, superficial and deep abdominals, Lt axillary and pectoral nodes, anterior intact thorax sequence, anterior inter-axillary anastomosis, Rt inguinal nodes, Rt axillo-inguinal anastomosis, then Rt UE working from proximal to distal  (lateral upper arm, inner to outer and lateral again; antecubital fossa, ant/post forearm and dorsal wrist/hand and fingers) and retracing all steps back to anastomosis and lymph nodes.  Compression Bandaging to Rt UE: Cocoa butter, TG soft, artiflex to elbow, 1/4 gray foam to lower and upper arm fixated with Idealbinde, Molelast to fingers 1-4, artiflex to hand, then short stretch compression bandages as follows: 1- 6 cm to hand/wrist with 1/2 gray foam to dorsal hand/wrist, 1- 10 cm spiral with X at elbow, 1- 12 cm herring bone fashion from wrist to upper arm, then last 12 cm sprial from wrist to axilla and folded TG soft over top of bandage.   05/20/24 Eval performed - discussed treatment options Berwyn Knights, PTA was available to cut foam at the end of the session while PT scheduled follow up visits.     PATIENT EDUCATION:  Education details: per today's note Person educated: Patient Education method: Explanation Education comprehension: verbalized understanding  HOME EXERCISE PROGRAM:   ASSESSMENT:  CLINICAL IMPRESSION: Pt tolerated first bandage very well and reports no pain as she had experienced in the past. Her and her husband worked on [employer] over the weekend but will need further instruction. Continued with CDT of Rt UE and incorporated P/ROM and STM to decrease Rt upper quadrant tightness. Pt recorded therapist applying bandage for assist with her and husband at home until he is able to come to a session for further instruction. Pts forearm was mildly softer today upon palpation during MLD.    OBJECTIVE IMPAIRMENTS: decreased knowledge of condition, decreased knowledge of use of DME, decreased mobility, decreased ROM,  increased edema, and increased fascial restrictions.   ACTIVITY LIMITATIONS: carrying and lifting  PARTICIPATION LIMITATIONS: meal prep, cleaning, community activity, and occupation  PERSONAL FACTORS: Time since onset of injury/illness/exacerbation and 1-2 comorbidities: complete ALND, radiation hx are also affecting patient's functional outcome.   REHAB POTENTIAL: Excellent  CLINICAL DECISION MAKING: Stable/uncomplicated  EVALUATION COMPLEXITY: Low  GOALS: Goals reviewed with patient? Yes  SHORT TERM GOALS: Target date: 06/28/24  Pt will be ind with self compression bandaging if able Baseline: Goal status: INITIAL  2.  Pt will be ind with self MLD for the Rt UE Baseline:  Goal status: INITIAL  3.  Pt will be educated on cellulitis and lymphedema risk reduction  Baseline:  Goal status: INITIAL   LONG TERM GOALS: Target date: 07/30/24  Pt will be measured for or obtain correct flat knit garments for long term maintenance  Baseline:  Goal status: INITIAL  2.  Pt will be ind with self MLD or pursue a new pump for long term maintenance Baseline:  Goal status: INITIAL    PLAN:  PT FREQUENCY: 2-3x per  week   PT DURATION: 10 weeks  PLANNED INTERVENTIONS: 97164- PT Re-evaluation, 97110-Therapeutic exercises, 97530- Therapeutic activity, 97112- Neuromuscular re-education, 97535- Self Care, [id number]- Manual therapy, Patient/Family education, Balance training, Joint mobilization, Therapeutic exercises, Therapeutic activity, Neuromuscular re-education, Gait training, and Self Care  PLAN FOR NEXT SESSION: Cont Rt UE CDT, educate on best practices for cellulitis reduction, include some stretching as able; instruct husband when he can come in bandaging.  Aden Berwyn Caldron, PTA 05/27/2024, 1:18 PM

## 2024-05-29 ENCOUNTER — Ambulatory Visit: Payer: Self-pay

## 2024-05-29 DIAGNOSIS — M25612 Stiffness of left shoulder, not elsewhere classified: Secondary | ICD-10-CM

## 2024-05-29 DIAGNOSIS — M25611 Stiffness of right shoulder, not elsewhere classified: Secondary | ICD-10-CM

## 2024-05-29 DIAGNOSIS — I972 Postmastectomy lymphedema syndrome: Secondary | ICD-10-CM

## 2024-05-29 NOTE — Therapy (Signed)
 OUTPATIENT PHYSICAL THERAPY  UPPER EXTREMITY ONCOLOGY TREATMENT  Patient Name: Sue Wong MRN: [id number] DOB:[birth year], 58 y.o., female Today's Date: 05/29/2024  END OF SESSION:  PT End of Session - 05/29/24 1516     Visit Number 4    Number of Visits 25    Date for PT Re-Evaluation 07/30/24    Authorization Type [employer] Approved 13 visits-05/20/2024-08/18/2024    Authorization - Visit Number 3    Authorization - Number of Visits 13    PT Start Time 1504    PT Stop Time 1610    PT Time Calculation (min) 66 min    Activity Tolerance Patient tolerated treatment well    Behavior During Therapy WFL for tasks assessed/performed          Past Medical History:  Diagnosis Date   Arthritis    Breast cancer (HCC)    GERD (gastroesophageal reflux disease)    Psoriasis    [place] spotted fever    Past Surgical History:  Procedure Laterality Date   BREAST SURGERY Bilateral 1/9 and 12/27/16   Patient Active Problem List   Diagnosis Date Noted   Sepsis (HCC) 04/26/2023   Cellulitis 06/04/2020   Fever 06/04/2020   Lymphedema 06/04/2020    REFERRING PROVIDER: Ophelia Sage III, MD  REFERRING DIAG: I89.0  THERAPY DIAG:  Postmastectomy lymphedema syndrome  Stiffness of left shoulder, not elsewhere classified  ONSET DATE: 2018  Rationale for Evaluation and Treatment: Rehabilitation  SUBJECTIVE:                                                                                                                                                                                           SUBJECTIVE STATEMENT: I practiced wrapping my arm this morning so you can see how I did. It was tricky with the foam but I really like the feel of the foam on my arm under the bandages!  PERTINENT HISTORY: Hx of Rt breast cancer metastasized to LN with bil mastectomy 12/13/2016 and then complete lymphadenectomy 12/27/16,  radiation and chemotherapy, hx of recurrent cellulitis Rt UE, psoriatic  arthritis  PAIN:  Are you having pain? No not right now.     PRECAUTIONS: recurrent Rt arm cellulitis   RED FLAGS: None   WEIGHT BEARING RESTRICTIONS: No  FALLS:  Has patient fallen in last 6 months? No  LIVING ENVIRONMENT: Lives with: lives with their family  OCCUPATION: Not working now.  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    LEISURE:   HAND DOMINANCE: right   PRIOR LEVEL OF FUNCTION: Independent  PATIENT GOALS: try to treat  this arm   OBJECTIVE: Note: Objective measures were completed at Evaluation unless otherwise noted.  COGNITION: Overall cognitive status: Within functional limits for tasks assessed   PALPATION: +1 pitting Rt dorsal hand and forearm, no pitting in the upper arm, tightness in the Rt axilla and pectoralis region  OBSERVATIONS / OTHER ASSESSMENTS: Rt arm larger with adipose appearance. Post radiation changes to Rt axilla   UPPER EXTREMITY AROM/PROM:  A/PROM RIGHT   eval   Shoulder extension   Shoulder flexion 145  Shoulder abduction   Shoulder internal rotation   Shoulder external rotation     (Blank rows = not tested)  A/PROM LEFT   eval  Shoulder extension   Shoulder flexion 160  Shoulder abduction   Shoulder internal rotation   Shoulder external rotation     (Blank rows = not tested)  LYMPHEDEMA ASSESSMENTS:   LANDMARK RIGHT  Previous visits with OT 05/20/24 05/29/24  At axilla      15 cm proximal to olecranon process 38 39.7 36.8  10 cm proximal to olecranon process 37.5 40.5 38.3  Olecranon process 33 37.5 34.9  15 cm proximal to ulnar styloid process 33.5 37.9 35.9  10 cm proximal to ulnar styloid process 30 34.1 33.5  Just proximal to ulnar styloid process (at wrist crease) 21.8 21.2 20.8  Across hand at thumb web space 20 21.5 21.2  At base of 2nd digit  7.5 6.8  (Blank rows = not tested)  LANDMARK LEFT  eval  At axilla    15 cm proximal to olecranon process 38.4  10 cm proximal to  olecranon process 37.3  Olecranon process 32  15 cm proximal to ulnar styloid process 30.2  10 cm proximal to ulnar styloid process 27.3  Just proximal to ulnar styloid process 20.2  Across hand at thumb web space 20.1  At base of 2nd digit 7.3  (Blank rows = not tested)  Lt: Rt: Difference of:  8.93ml length                                                                                                                             TREATMENT DATE:  05/29/24: Manual Therapy Removed bandages and critiqued while removing to educate pt on how to improve her self bandaging techniques. Also re measured circumference which has reduced very well already since eval.  MLD to Rt UE: Short neck, superficial and deep abdominals, Lt axillary and pectoral nodes, anterior intact thorax sequence, anterior inter-axillary anastomosis, Rt inguinal nodes, Rt axillo-inguinal anastomosis, then Rt UE working from proximal to distal  (lateral upper arm, inner to outer and lateral again; antecubital fossa, ant/post forearm and dorsal wrist/hand and fingers) and retracing all steps back to anastomosis and lymph nodes. P/ROM to Rt shoulder into flex, abd and D2 with scapular depression by therapist throughout. While stretching pt informed her that since shtretching felt so good she should look into ordering over the door  pulleys for home and showed her this on [employer].  STM to very tight lateral trunk/lats , also to pect insertion.  Compression Bandaging to Rt UE: Cocoa butter, TG soft, artiflex to elbow, 1/4 gray foam to lower and upper arm fixated with Idealbinde, Molelast to fingers 1-4, artiflex to hand, then short stretch compression bandages as follows: 1- 6 cm to hand/wrist with 1/2 gray foam to dorsal hand/wrist, 1- 10 cm spiral with X at elbow, 1- 12 cm herring bone fashion from wrist to upper arm, then last 12 cm sprial from wrist to axilla and folded TG soft over top of bandage. Pt  videorecorded this with her cell phone for further reinforcement at home.  Therapeutic Exercises Scaption a few reps to instruct pt in proper technique and to focus on scapular depression  05/27/24: Manual Therapy MLD to Rt UE: Short neck, superficial and deep abdominals, Lt axillary and pectoral nodes, anterior intact thorax sequence, anterior inter-axillary anastomosis, Rt inguinal nodes, Rt axillo-inguinal anastomosis, then Rt UE working from proximal to distal  (lateral upper arm, inner to outer and lateral again; antecubital fossa, ant/post forearm and dorsal wrist/hand and fingers) and retracing all steps back to anastomosis and lymph nodes. P/ROM to Rt shoulder into flex, abd and D2 with scapular depression by therapist throughout STM to very tight lateral trunk/lats  Compression Bandaging to Rt UE: Cocoa butter, TG soft, artiflex to elbow, 1/4 gray foam to lower and upper arm fixated with Idealbinde, Molelast to fingers 1-4, artiflex to hand, then short stretch compression bandages as follows: 1- 6 cm to hand/wrist with 1/2 gray foam to dorsal hand/wrist, 1- 10 cm spiral with X at elbow, 1- 12 cm herring bone fashion from wrist to upper arm, then last 12 cm sprial from wrist to axilla and folded TG soft over top of bandage. Pt videorecorded this with her cell phone for further reinforcement at home.   05/24/24: Self Care Spent time at beginning of session educating pt on basics of anatomy of the lymphatic system and basic principles of MLD. Once bandages on also instructed her in remedial exercises (open/close fist, elbow flex/ext and shoulder A/ROM) to perform while wearing bandages to allow for improved lymphatic flow.  Manual Therapy MLD to Rt UE: Short neck, superficial and deep abdominals, Lt axillary and pectoral nodes, anterior intact thorax sequence, anterior inter-axillary anastomosis, Rt inguinal nodes, Rt axillo-inguinal anastomosis, then Rt UE working from proximal to distal   (lateral upper arm, inner to outer and lateral again; antecubital fossa, ant/post forearm and dorsal wrist/hand and fingers) and retracing all steps back to anastomosis and lymph nodes.  Compression Bandaging to Rt UE: Cocoa butter, TG soft, artiflex to elbow, 1/4 gray foam to lower and upper arm fixated with Idealbinde, Molelast to fingers 1-4, artiflex to hand, then short stretch compression bandages as follows: 1- 6 cm to hand/wrist with 1/2 gray foam to dorsal hand/wrist, 1- 10 cm spiral with X at elbow, 1- 12 cm herring bone fashion from wrist to upper arm, then last 12 cm sprial from wrist to axilla and folded TG soft over top of bandage.      PATIENT EDUCATION:  Education details: per today's note Person educated: Patient Education method: Explanation Education comprehension: verbalized understanding  HOME EXERCISE PROGRAM:   ASSESSMENT:  CLINICAL IMPRESSION: Pt is tolerating bandages with foam very well. She practiced self bandaging for first time with foam this morning. Overall bandage constructed correctly except she forgot to use Idealbinde to fixate foam.  Pt was able to verbalize good understanding of instructions. Then continued with CDT to Rt UE as she has already had good circumferential reductions. Also briefly showed her how to use pulleys correctly at end of session as pt is going to look into purchasing these for home use.    OBJECTIVE IMPAIRMENTS: decreased knowledge of condition, decreased knowledge of use of DME, decreased mobility, decreased ROM, increased edema, and increased fascial restrictions.   ACTIVITY LIMITATIONS: carrying and lifting  PARTICIPATION LIMITATIONS: meal prep, cleaning, community activity, and occupation  PERSONAL FACTORS: Time since onset of injury/illness/exacerbation and 1-2 comorbidities: complete ALND, radiation hx are also affecting patient's functional outcome.   REHAB POTENTIAL: Excellent  CLINICAL DECISION MAKING:  Stable/uncomplicated  EVALUATION COMPLEXITY: Low  GOALS: Goals reviewed with patient? Yes  SHORT TERM GOALS: Target date: 06/28/24  Pt will be ind with self compression bandaging if able Baseline: Goal status: INITIAL  2.  Pt will be ind with self MLD for the Rt UE Baseline:  Goal status: INITIAL  3.  Pt will be educated on cellulitis and lymphedema risk reduction  Baseline:  Goal status: INITIAL   LONG TERM GOALS: Target date: 07/30/24  Pt will be measured for or obtain correct flat knit garments for long term maintenance  Baseline:  Goal status: INITIAL  2.  Pt will be ind with self MLD or pursue a new pump for long term maintenance Baseline:  Goal status: INITIAL    PLAN:  PT FREQUENCY: 2-3x per week   PT DURATION: 10 weeks  PLANNED INTERVENTIONS: 97164- PT Re-evaluation, 97110-Therapeutic exercises, 97530- Therapeutic activity, 97112- Neuromuscular re-education, 97535- Self Care, 02859- Manual therapy, Patient/Family education, Balance training, Joint mobilization, Therapeutic exercises, Therapeutic activity, Neuromuscular re-education, Gait training, and Self Care  PLAN FOR NEXT SESSION: Cont Rt UE CDT, get pulleys? Instruct pt next in self bandaging; educate on best practices for cellulitis reduction, include some stretching as able; instruct husband when he can come in bandaging.  Aden Berwyn Caldron, PTA 05/29/2024, 4:39 PM

## 2024-05-31 ENCOUNTER — Ambulatory Visit: Payer: Self-pay | Admitting: Rehabilitation

## 2024-05-31 ENCOUNTER — Encounter: Payer: Self-pay | Admitting: Rehabilitation

## 2024-05-31 DIAGNOSIS — M25612 Stiffness of left shoulder, not elsewhere classified: Secondary | ICD-10-CM

## 2024-05-31 DIAGNOSIS — I972 Postmastectomy lymphedema syndrome: Secondary | ICD-10-CM | POA: Diagnosis not present

## 2024-05-31 DIAGNOSIS — M25611 Stiffness of right shoulder, not elsewhere classified: Secondary | ICD-10-CM

## 2024-05-31 NOTE — Therapy (Signed)
 OUTPATIENT PHYSICAL THERAPY  UPPER EXTREMITY ONCOLOGY TREATMENT  Patient Name: Sue Wong MRN: [id number] DOB:11-12-1966, 58 y.o., female Today's Date: 05/31/2024  END OF SESSION:  PT End of Session - 05/31/24 1007     Visit Number 5    Number of Visits 25    Date for PT Re-Evaluation 07/30/24    PT Start Time 1009    PT Stop Time 1056    PT Time Calculation (min) 47 min    Activity Tolerance Patient tolerated treatment well    Behavior During Therapy [facility] for tasks assessed/performed          Past Medical History:  Diagnosis Date   Arthritis    Breast cancer (HCC)    GERD (gastroesophageal reflux disease)    Psoriasis    [place] spotted fever    Past Surgical History:  Procedure Laterality Date   BREAST SURGERY Bilateral 1/9 and 12/27/16   Patient Active Problem List   Diagnosis Date Noted   Sepsis (HCC) 04/26/2023   Cellulitis 06/04/2020   Fever 06/04/2020   Lymphedema 06/04/2020    REFERRING PROVIDER: Ophelia Sage III, MD  REFERRING DIAG: I89.0  THERAPY DIAG:  Postmastectomy lymphedema syndrome  Stiffness of left shoulder, not elsewhere classified  ONSET DATE: 2018  Rationale for Evaluation and Treatment: Rehabilitation  SUBJECTIVE:                                                                                                                                                                                           SUBJECTIVE STATEMENT: I think I got it a little sore when I mowed the yard and I was wrapped.    PERTINENT HISTORY: Hx of Rt breast cancer metastasized to LN with bil mastectomy 12/13/2016 and then complete lymphadenectomy 12/27/16,  radiation and chemotherapy, hx of recurrent cellulitis Rt UE, psoriatic arthritis  PAIN:  Are you having pain? No not right now.     PRECAUTIONS: recurrent Rt arm cellulitis   RED FLAGS: None   WEIGHT BEARING RESTRICTIONS: No  FALLS:  Has patient fallen in last 6 months? No  LIVING  ENVIRONMENT: Lives with: lives with their family  OCCUPATION: Not working now.  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    LEISURE:   HAND DOMINANCE: right   PRIOR LEVEL OF FUNCTION: Independent  PATIENT GOALS: try to treat this arm   OBJECTIVE: Note: Objective measures were completed at Evaluation unless otherwise noted.  COGNITION: Overall cognitive status: Within functional limits for tasks assessed   PALPATION: +1 pitting Rt dorsal hand and forearm, no pitting in the upper arm,  tightness in the Rt axilla and pectoralis region  OBSERVATIONS / OTHER ASSESSMENTS: Rt arm larger with adipose appearance. Post radiation changes to Rt axilla   UPPER EXTREMITY AROM/PROM:  A/PROM RIGHT   eval   Shoulder extension   Shoulder flexion 145  Shoulder abduction   Shoulder internal rotation   Shoulder external rotation     (Blank rows = not tested)  A/PROM LEFT   eval  Shoulder extension   Shoulder flexion 160  Shoulder abduction   Shoulder internal rotation   Shoulder external rotation     (Blank rows = not tested)  LYMPHEDEMA ASSESSMENTS:   LANDMARK RIGHT  Previous visits with OT 05/20/24 05/29/24  At axilla      15 cm proximal to olecranon process 38 39.7 36.8  10 cm proximal to olecranon process 37.5 40.5 38.3  Olecranon process 33 37.5 34.9  15 cm proximal to ulnar styloid process 33.5 37.9 35.9  10 cm proximal to ulnar styloid process 30 34.1 33.5  Just proximal to ulnar styloid process (at wrist crease) 21.8 21.2 20.8  Across hand at thumb web space 20 21.5 21.2  At base of 2nd digit  7.5 6.8  (Blank rows = not tested)  LANDMARK LEFT  eval  At axilla    15 cm proximal to olecranon process 38.4  10 cm proximal to olecranon process 37.3  Olecranon process 32  15 cm proximal to ulnar styloid process 30.2  10 cm proximal to ulnar styloid process 27.3  Just proximal to ulnar styloid process 20.2  Across hand at thumb web space 20.1  At  base of 2nd digit 7.3  (Blank rows = not tested)  Lt: Rt: Difference of:  8.69ml length                                                                                                                             TREATMENT DATE:  05/31/24: Manual Therapy Removed bandages   MLD to Rt UE: Short neck, superficial and deep abdominals, Lt axillary and pectoral nodes, anterior intact thorax sequence, anterior inter-axillary anastomosis, Rt inguinal nodes, Rt axillo-inguinal anastomosis, then Rt UE working from proximal to distal  (lateral upper arm, inner to outer and lateral again; antecubital fossa, ant/post forearm and dorsal wrist/hand and fingers) and retracing all steps back to anastomosis and lymph nodes. P/ROM to Rt shoulder into flex, abd and D2   Compression Bandaging to Rt UE: Cocoa butter, TG soft, artiflex to elbow, 1/4 gray foam to lower and upper arm fixated with Idealbinde, Molelast to fingers 1-4, artiflex to hand, then short stretch compression bandages as follows: 1- 6 cm to hand/wrist with 1/2 gray foam to dorsal hand/wrist, 1- 10 cm spiral with X at elbow, 1- 12 cm herring bone fashion from wrist to upper arm, then last 12 cm sprial from wrist to axilla and folded TG soft over top of bandage. Pt videorecorded this with her cell  phone for further reinforcement at home.   05/29/24: Manual Therapy Removed bandages and critiqued while removing to educate pt on how to improve her self bandaging techniques. Also re measured circumference which has reduced very well already since eval.  MLD to Rt UE: Short neck, superficial and deep abdominals, Lt axillary and pectoral nodes, anterior intact thorax sequence, anterior inter-axillary anastomosis, Rt inguinal nodes, Rt axillo-inguinal anastomosis, then Rt UE working from proximal to distal  (lateral upper arm, inner to outer and lateral again; antecubital fossa, ant/post forearm and dorsal wrist/hand and fingers) and  retracing all steps back to anastomosis and lymph nodes. P/ROM to Rt shoulder into flex, abd and D2 with scapular depression by therapist throughout. While stretching pt informed her that since shtretching felt so good she should look into ordering over the door pulleys for home and showed her this on [employer].  STM to very tight lateral trunk/lats , also to pect insertion.  Compression Bandaging to Rt UE: Cocoa butter, TG soft, artiflex to elbow, 1/4 gray foam to lower and upper arm fixated with Idealbinde, Molelast to fingers 1-4, artiflex to hand, then short stretch compression bandages as follows: 1- 6 cm to hand/wrist with 1/2 gray foam to dorsal hand/wrist, 1- 10 cm spiral with X at elbow, 1- 12 cm herring bone fashion from wrist to upper arm, then last 12 cm sprial from wrist to axilla and folded TG soft over top of bandage. Pt videorecorded this with her cell phone for further reinforcement at home.  Therapeutic Exercises Scaption a few reps to instruct pt in proper technique and to focus on scapular depression  05/27/24: Manual Therapy MLD to Rt UE: Short neck, superficial and deep abdominals, Lt axillary and pectoral nodes, anterior intact thorax sequence, anterior inter-axillary anastomosis, Rt inguinal nodes, Rt axillo-inguinal anastomosis, then Rt UE working from proximal to distal  (lateral upper arm, inner to outer and lateral again; antecubital fossa, ant/post forearm and dorsal wrist/hand and fingers) and retracing all steps back to anastomosis and lymph nodes. P/ROM to Rt shoulder into flex, abd and D2 with scapular depression by therapist throughout STM to very tight lateral trunk/lats  Compression Bandaging to Rt UE: Cocoa butter, TG soft, artiflex to elbow, 1/4 gray foam to lower and upper arm fixated with Idealbinde, Molelast to fingers 1-4, artiflex to hand, then short stretch compression bandages as follows: 1- 6 cm to hand/wrist with 1/2 gray foam to dorsal hand/wrist, 1- 10  cm spiral with X at elbow, 1- 12 cm herring bone fashion from wrist to upper arm, then last 12 cm sprial from wrist to axilla and folded TG soft over top of bandage. Pt videorecorded this with her cell phone for further reinforcement at home.   05/24/24: Self Care Spent time at beginning of session educating pt on basics of anatomy of the lymphatic system and basic principles of MLD. Once bandages on also instructed her in remedial exercises (open/close fist, elbow flex/ext and shoulder A/ROM) to perform while wearing bandages to allow for improved lymphatic flow.  Manual Therapy MLD to Rt UE: Short neck, superficial and deep abdominals, Lt axillary and pectoral nodes, anterior intact thorax sequence, anterior inter-axillary anastomosis, Rt inguinal nodes, Rt axillo-inguinal anastomosis, then Rt UE working from proximal to distal  (lateral upper arm, inner to outer and lateral again; antecubital fossa, ant/post forearm and dorsal wrist/hand and fingers) and retracing all steps back to anastomosis and lymph nodes.  Compression Bandaging to Rt UE: Cocoa butter, TG soft, artiflex  to elbow, 1/4 gray foam to lower and upper arm fixated with Idealbinde, Molelast to fingers 1-4, artiflex to hand, then short stretch compression bandages as follows: 1- 6 cm to hand/wrist with 1/2 gray foam to dorsal hand/wrist, 1- 10 cm spiral with X at elbow, 1- 12 cm herring bone fashion from wrist to upper arm, then last 12 cm sprial from wrist to axilla and folded TG soft over top of bandage.      PATIENT EDUCATION:  Education details: per today's note Person educated: Patient Education method: Explanation Education comprehension: verbalized understanding  HOME EXERCISE PROGRAM:   ASSESSMENT:  CLINICAL IMPRESSION: Pt is tolerating bandages with foam very well. Then continued with CDT to Rt UE.  Discussed getting night garment sooner as this may help with fibrosis but pt reports she has one from before and  will bring that in to see if it will work for now.   OBJECTIVE IMPAIRMENTS: decreased knowledge of condition, decreased knowledge of use of DME, decreased mobility, decreased ROM, increased edema, and increased fascial restrictions.   ACTIVITY LIMITATIONS: carrying and lifting  PARTICIPATION LIMITATIONS: meal prep, cleaning, community activity, and occupation  PERSONAL FACTORS: Time since onset of injury/illness/exacerbation and 1-2 comorbidities: complete ALND, radiation hx are also affecting patient's functional outcome.   REHAB POTENTIAL: Excellent  CLINICAL DECISION MAKING: Stable/uncomplicated  EVALUATION COMPLEXITY: Low  GOALS: Goals reviewed with patient? Yes  SHORT TERM GOALS: Target date: 06/28/24  Pt will be ind with self compression bandaging if able Baseline: Goal status: INITIAL  2.  Pt will be ind with self MLD for the Rt UE Baseline:  Goal status: INITIAL  3.  Pt will be educated on cellulitis and lymphedema risk reduction  Baseline:  Goal status: INITIAL   LONG TERM GOALS: Target date: 07/30/24  Pt will be measured for or obtain correct flat knit garments for long term maintenance  Baseline:  Goal status: INITIAL  2.  Pt will be ind with self MLD or pursue a new pump for long term maintenance Baseline:  Goal status: INITIAL    PLAN:  PT FREQUENCY: 2-3x per week   PT DURATION: 10 weeks  PLANNED INTERVENTIONS: 97164- PT Re-evaluation, 97110-Therapeutic exercises, 97530- Therapeutic activity, 97112- Neuromuscular re-education, 97535- Self Care, 02859- Manual therapy, Patient/Family education, Balance training, Joint mobilization, Therapeutic exercises, Therapeutic activity, Neuromuscular re-education, Gait training, and Self Care  PLAN FOR NEXT SESSION: Cont Rt UE CDT, cont Instructing pt in self bandaging; include some stretching as able; instruct husband when he can come in bandaging.  Larue Saddie SAUNDERS, PT 05/31/2024, 12:39 PM

## 2024-06-03 ENCOUNTER — Ambulatory Visit: Payer: Self-pay

## 2024-06-03 DIAGNOSIS — M25611 Stiffness of right shoulder, not elsewhere classified: Secondary | ICD-10-CM

## 2024-06-03 DIAGNOSIS — M25612 Stiffness of left shoulder, not elsewhere classified: Secondary | ICD-10-CM

## 2024-06-03 DIAGNOSIS — I972 Postmastectomy lymphedema syndrome: Secondary | ICD-10-CM | POA: Diagnosis not present

## 2024-06-03 NOTE — Therapy (Signed)
 OUTPATIENT PHYSICAL THERAPY  UPPER EXTREMITY ONCOLOGY TREATMENT  Patient Name: Sue Wong MRN: [id number] DOB:[birth year], 58 y.o., female Today's Date: 06/03/2024  END OF SESSION:  PT End of Session - 06/03/24 1502     Visit Number 6    Number of Visits 25    Date for PT Re-Evaluation 07/30/24    Authorization Type [employer] Approved 13 visits-05/20/2024-08/18/2024    Authorization - Visit Number 4    Authorization - Number of Visits 13    PT Start Time 1503    PT Stop Time 1610    PT Time Calculation (min) 67 min    Activity Tolerance Patient tolerated treatment well    Behavior During Therapy WFL for tasks assessed/performed          Past Medical History:  Diagnosis Date   Arthritis    Breast cancer (HCC)    GERD (gastroesophageal reflux disease)    Psoriasis    [place] spotted fever    Past Surgical History:  Procedure Laterality Date   BREAST SURGERY Bilateral 1/9 and 12/27/16   Patient Active Problem List   Diagnosis Date Noted   Sepsis (HCC) 04/26/2023   Cellulitis 06/04/2020   Fever 06/04/2020   Lymphedema 06/04/2020    REFERRING PROVIDER: Ophelia Sage III, MD  REFERRING DIAG: I89.0  THERAPY DIAG:  Postmastectomy lymphedema syndrome  Stiffness of left shoulder, not elsewhere classified  ONSET DATE: 2018  Rationale for Evaluation and Treatment: Rehabilitation  SUBJECTIVE:                                                                                                                                                                                           SUBJECTIVE STATEMENT:  I have had trouble finding the right size finger wraps. The finger wrap hasn't stayed on well, but the wraps have been more comfortable with the foam underneath.     PERTINENT HISTORY: Hx of Rt breast cancer metastasized to LN with bil mastectomy 12/13/2016 and then complete lymphadenectomy 12/27/16,  radiation and chemotherapy, hx of recurrent cellulitis Rt UE,  psoriatic arthritis  PAIN:  Are you having pain? No not right now.     PRECAUTIONS: recurrent Rt arm cellulitis   RED FLAGS: None   WEIGHT BEARING RESTRICTIONS: No  FALLS:  Has patient fallen in last 6 months? No  LIVING ENVIRONMENT: Lives with: lives with their family  OCCUPATION: Not working now.  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    LEISURE:   HAND DOMINANCE: right   PRIOR LEVEL OF FUNCTION: Independent  PATIENT GOALS: try to treat this arm  OBJECTIVE: Note: Objective measures were completed at Evaluation unless otherwise noted.  COGNITION: Overall cognitive status: Within functional limits for tasks assessed   PALPATION: +1 pitting Rt dorsal hand and forearm, no pitting in the upper arm, tightness in the Rt axilla and pectoralis region  OBSERVATIONS / OTHER ASSESSMENTS: Rt arm larger with adipose appearance. Post radiation changes to Rt axilla   UPPER EXTREMITY AROM/PROM:  A/PROM RIGHT   eval   Shoulder extension   Shoulder flexion 145  Shoulder abduction   Shoulder internal rotation   Shoulder external rotation     (Blank rows = not tested)  A/PROM LEFT   eval  Shoulder extension   Shoulder flexion 160  Shoulder abduction   Shoulder internal rotation   Shoulder external rotation     (Blank rows = not tested)  LYMPHEDEMA ASSESSMENTS:   LANDMARK RIGHT  Previous visits with OT 05/20/24 05/29/24  At axilla      15 cm proximal to olecranon process 38 39.7 36.8  10 cm proximal to olecranon process 37.5 40.5 38.3  Olecranon process 33 37.5 34.9  15 cm proximal to ulnar styloid process 33.5 37.9 35.9  10 cm proximal to ulnar styloid process 30 34.1 33.5  Just proximal to ulnar styloid process (at wrist crease) 21.8 21.2 20.8  Across hand at thumb web space 20 21.5 21.2  At base of 2nd digit  7.5 6.8  (Blank rows = not tested)  LANDMARK LEFT  eval  At axilla    15 cm proximal to olecranon process 38.4  10 cm  proximal to olecranon process 37.3  Olecranon process 32  15 cm proximal to ulnar styloid process 30.2  10 cm proximal to ulnar styloid process 27.3  Just proximal to ulnar styloid process 20.2  Across hand at thumb web space 20.1  At base of 2nd digit 7.3  (Blank rows = not tested)  Lt: Rt: Difference of:  8.80ml length                                                                                                                             TREATMENT DATE:  06/03/2024 Pt came in unwrapped. Removed bandages at home today Manual Therapy Removed bandages   MLD to Rt UE: Short neck, superficial and deep abdominals, Lt axillary and pectoral nodes,  anterior inter-axillary anastomosis, Rt inguinal nodes, Rt axillo-inguinal anastomosis, then Rt UE working from proximal to distal  (lateral upper arm, inner to outer and lateral again; antecubital fossa, ant/post forearm and dorsal wrist/hand and fingers) and retracing all steps back to anastomosis and lymph nodes. Compression Bandaging to Rt UE: Cocoa butter, TG soft, artiflex to elbow, 1/4 gray foam to lower and upper arm fixated with Idealbinde, Molelast to fingers 1-4, artiflex to hand, then short stretch compression bandages as follows: 1- 6 cm to hand/wrist with 1/2 gray foam to dorsal hand/wrist, 1- 10 cm spiral with X at elbow, 1-  12 cm herring bone fashion from wrist to upper arm, then last 12 cm sprial from wrist to axilla and folded TG soft over top of bandage. Checked capillary refill and is good. Pt reported wrap is comfortable.  05/31/24: Manual Therapy Removed bandages   MLD to Rt UE: Short neck, superficial and deep abdominals, Lt axillary and pectoral nodes, anterior intact thorax sequence, anterior inter-axillary anastomosis, Rt inguinal nodes, Rt axillo-inguinal anastomosis, then Rt UE working from proximal to distal  (lateral upper arm, inner to outer and lateral again; antecubital fossa, ant/post forearm  and dorsal wrist/hand and fingers) and retracing all steps back to anastomosis and lymph nodes. P/ROM to Rt shoulder into flex, abd and D2   Compression Bandaging to Rt UE: Cocoa butter, TG soft, artiflex to elbow, 1/4 gray foam to lower and upper arm fixated with Idealbinde, Molelast to fingers 1-4, artiflex to hand, then short stretch compression bandages as follows: 1- 6 cm to hand/wrist with 1/2 gray foam to dorsal hand/wrist, 1- 10 cm spiral with X at elbow, 1- 12 cm herring bone fashion from wrist to upper arm, then last 12 cm sprial from wrist to axilla and folded TG soft over top of bandage. Pt videorecorded this with her cell phone for further reinforcement at home.   05/29/24: Manual Therapy Removed bandages and critiqued while removing to educate pt on how to improve her self bandaging techniques. Also re measured circumference which has reduced very well already since eval.  MLD to Rt UE: Short neck, superficial and deep abdominals, Lt axillary and pectoral nodes, anterior intact thorax sequence, anterior inter-axillary anastomosis, Rt inguinal nodes, Rt axillo-inguinal anastomosis, then Rt UE working from proximal to distal  (lateral upper arm, inner to outer and lateral again; antecubital fossa, ant/post forearm and dorsal wrist/hand and fingers) and retracing all steps back to anastomosis and lymph nodes. P/ROM to Rt shoulder into flex, abd and D2 with scapular depression by therapist throughout. While stretching pt informed her that since shtretching felt so good she should look into ordering over the door pulleys for home and showed her this on [employer].  STM to very tight lateral trunk/lats , also to pect insertion.  Compression Bandaging to Rt UE: Cocoa butter, TG soft, artiflex to elbow, 1/4 gray foam to lower and upper arm fixated with Idealbinde, Molelast to fingers 1-4, artiflex to hand, then short stretch compression bandages as follows: 1- 6 cm to hand/wrist with 1/2 gray foam  to dorsal hand/wrist, 1- 10 cm spiral with X at elbow, 1- 12 cm herring bone fashion from wrist to upper arm, then last 12 cm sprial from wrist to axilla and folded TG soft over top of bandage. Pt videorecorded this with her cell phone for further reinforcement at home.  Therapeutic Exercises Scaption a few reps to instruct pt in proper technique and to focus on scapular depression  05/27/24: Manual Therapy MLD to Rt UE: Short neck, superficial and deep abdominals, Lt axillary and pectoral nodes, anterior intact thorax sequence, anterior inter-axillary anastomosis, Rt inguinal nodes, Rt axillo-inguinal anastomosis, then Rt UE working from proximal to distal  (lateral upper arm, inner to outer and lateral again; antecubital fossa, ant/post forearm and dorsal wrist/hand and fingers) and retracing all steps back to anastomosis and lymph nodes. P/ROM to Rt shoulder into flex, abd and D2 with scapular depression by therapist throughout STM to very tight lateral trunk/lats  Compression Bandaging to Rt UE: Cocoa butter, TG soft, artiflex to elbow, 1/4 gray foam to  lower and upper arm fixated with Idealbinde, Molelast to fingers 1-4, artiflex to hand, then short stretch compression bandages as follows: 1- 6 cm to hand/wrist with 1/2 gray foam to dorsal hand/wrist, 1- 10 cm spiral with X at elbow, 1- 12 cm herring bone fashion from wrist to upper arm, then last 12 cm sprial from wrist to axilla and folded TG soft over top of bandage. Pt videorecorded this with her cell phone for further reinforcement at home.   05/24/24: Self Care Spent time at beginning of session educating pt on basics of anatomy of the lymphatic system and basic principles of MLD. Once bandages on also instructed her in remedial exercises (open/close fist, elbow flex/ext and shoulder A/ROM) to perform while wearing bandages to allow for improved lymphatic flow.  Manual Therapy MLD to Rt UE: Short neck, superficial and deep abdominals,  Lt axillary and pectoral nodes, anterior intact thorax sequence, anterior inter-axillary anastomosis, Rt inguinal nodes, Rt axillo-inguinal anastomosis, then Rt UE working from proximal to distal  (lateral upper arm, inner to outer and lateral again; antecubital fossa, ant/post forearm and dorsal wrist/hand and fingers) and retracing all steps back to anastomosis and lymph nodes.  Compression Bandaging to Rt UE: Cocoa butter, TG soft, artiflex to elbow, 1/4 gray foam to lower and upper arm fixated with Idealbinde, Molelast to fingers 1-4, artiflex to hand, then short stretch compression bandages as follows: 1- 6 cm to hand/wrist with 1/2 gray foam to dorsal hand/wrist, 1- 10 cm spiral with X at elbow, 1- 12 cm herring bone fashion from wrist to upper arm, then last 12 cm sprial from wrist to axilla and folded TG soft over top of bandage.      PATIENT EDUCATION:  Education details: per today's note Person educated: Patient Education method: Explanation Education comprehension: verbalized understanding  HOME EXERCISE PROGRAM:   ASSESSMENT:  CLINICAL IMPRESSION: Pt removed wrap at home today so will measure next visit. She forgot to bring what sounds like a velcro wrap with her today so we could check it. She reports bandage much more comfortable for her with the gray foam underneath.  OBJECTIVE IMPAIRMENTS: decreased knowledge of condition, decreased knowledge of use of DME, decreased mobility, decreased ROM, increased edema, and increased fascial restrictions.   ACTIVITY LIMITATIONS: carrying and lifting  PARTICIPATION LIMITATIONS: meal prep, cleaning, community activity, and occupation  PERSONAL FACTORS: Time since onset of injury/illness/exacerbation and 1-2 comorbidities: complete ALND, radiation hx are also affecting patient's functional outcome.   REHAB POTENTIAL: Excellent  CLINICAL DECISION MAKING: Stable/uncomplicated  EVALUATION COMPLEXITY: Low  GOALS: Goals reviewed  with patient? Yes  SHORT TERM GOALS: Target date: 06/28/24  Pt will be ind with self compression bandaging if able Baseline: Goal status: INITIAL  2.  Pt will be ind with self MLD for the Rt UE Baseline:  Goal status: INITIAL  3.  Pt will be educated on cellulitis and lymphedema risk reduction  Baseline:  Goal status: INITIAL   LONG TERM GOALS: Target date: 07/30/24  Pt will be measured for or obtain correct flat knit garments for long term maintenance  Baseline:  Goal status: INITIAL  2.  Pt will be ind with self MLD or pursue a new pump for long term maintenance Baseline:  Goal status: INITIAL    PLAN:  PT FREQUENCY: 2-3x per week   PT DURATION: 10 weeks  PLANNED INTERVENTIONS: 97164- PT Re-evaluation, 97110-Therapeutic exercises, 97530- Therapeutic activity, [id number]- Neuromuscular re-education, 97535- Self Care, [id number]- Manual therapy, Patient/Family education, Balance  training, Joint mobilization, Therapeutic exercises, Therapeutic activity, Neuromuscular re-education, Gait training, and Self Care  PLAN FOR NEXT SESSION: Cont Rt UE CDT, cont Instructing pt in self bandaging; include some stretching as able; instruct husband when he can come in bandaging.  Sue Wong, PT 06/03/2024, 4:15 PM

## 2024-06-05 ENCOUNTER — Encounter: Payer: Self-pay | Admitting: Rehabilitation

## 2024-06-05 ENCOUNTER — Ambulatory Visit: Payer: Self-pay | Attending: Internal Medicine | Admitting: Rehabilitation

## 2024-06-05 DIAGNOSIS — I972 Postmastectomy lymphedema syndrome: Secondary | ICD-10-CM | POA: Insufficient documentation

## 2024-06-05 DIAGNOSIS — M25612 Stiffness of left shoulder, not elsewhere classified: Secondary | ICD-10-CM

## 2024-06-05 DIAGNOSIS — M25611 Stiffness of right shoulder, not elsewhere classified: Secondary | ICD-10-CM | POA: Insufficient documentation

## 2024-06-05 NOTE — Therapy (Signed)
 OUTPATIENT PHYSICAL THERAPY  UPPER EXTREMITY ONCOLOGY TREATMENT  Patient Name: Sue Wong MRN: [id number] DOB:16-Oct-1966, 58 y.o., female Today's Date: 06/05/2024  END OF SESSION:  PT End of Session - 06/05/24 1406     Visit Number 7    Number of Visits 25    Date for PT Re-Evaluation 07/30/24    Authorization Type [employer] Approved 13 visits-05/20/2024-08/18/2024    Authorization - Visit Number 7    Authorization - Number of Visits 13    PT Start Time 1409    PT Stop Time 1506    PT Time Calculation (min) 57 min    Activity Tolerance Patient tolerated treatment well    Behavior During Therapy WFL for tasks assessed/performed           Past Medical History:  Diagnosis Date   Arthritis    Breast cancer (HCC)    GERD (gastroesophageal reflux disease)    Psoriasis    [place] spotted fever    Past Surgical History:  Procedure Laterality Date   BREAST SURGERY Bilateral 1/9 and 12/27/16   Patient Active Problem List   Diagnosis Date Noted   Sepsis (HCC) 04/26/2023   Cellulitis 06/04/2020   Fever 06/04/2020   Lymphedema 06/04/2020    REFERRING PROVIDER: Ophelia Sage III, MD  REFERRING DIAG: I89.0  THERAPY DIAG:  Postmastectomy lymphedema syndrome  Stiffness of left shoulder, not elsewhere classified  ONSET DATE: 2018  Rationale for Evaluation and Treatment: Rehabilitation  SUBJECTIVE:                                                                                                                                                                                           SUBJECTIVE STATEMENT:    It looked smaller when Robin did it and I took it off but when I do it it is definitely not as good.   PERTINENT HISTORY: Hx of Rt breast cancer metastasized to LN with bil mastectomy 12/13/2016 and then complete lymphadenectomy 12/27/16,  radiation and chemotherapy, hx of recurrent cellulitis Rt UE, psoriatic arthritis  PAIN:  Are you having pain? No not right  now.     PRECAUTIONS: recurrent Rt arm cellulitis   RED FLAGS: None   WEIGHT BEARING RESTRICTIONS: No  FALLS:  Has patient fallen in last 6 months? No  LIVING ENVIRONMENT: Lives with: lives with their family  OCCUPATION: Not working now.  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    LEISURE:   HAND DOMINANCE: right   PRIOR LEVEL OF FUNCTION: Independent  PATIENT GOALS: try to treat this arm   OBJECTIVE: Note: Objective  measures were completed at Evaluation unless otherwise noted.  COGNITION: Overall cognitive status: Within functional limits for tasks assessed   PALPATION: +1 pitting Rt dorsal hand and forearm, no pitting in the upper arm, tightness in the Rt axilla and pectoralis region  OBSERVATIONS / OTHER ASSESSMENTS: Rt arm larger with adipose appearance. Post radiation changes to Rt axilla   UPPER EXTREMITY AROM/PROM:  A/PROM RIGHT   eval   Shoulder extension   Shoulder flexion 145  Shoulder abduction   Shoulder internal rotation   Shoulder external rotation     (Blank rows = not tested)  A/PROM LEFT   eval  Shoulder extension   Shoulder flexion 160  Shoulder abduction   Shoulder internal rotation   Shoulder external rotation     (Blank rows = not tested)  LYMPHEDEMA ASSESSMENTS:   LANDMARK RIGHT  Previous visits with OT 05/20/24 05/29/24 06/05/24  At axilla       15 cm proximal to olecranon process 38 39.7 36.8 38.3  10 cm proximal to olecranon process 37.5 40.5 38.3 40.2  Olecranon process 33 37.5 34.9 35.3  15 cm proximal to ulnar styloid process 33.5 37.9 35.9 36.2  10 cm proximal to ulnar styloid process 30 34.1 33.5 33.5  Just proximal to ulnar styloid process (at wrist crease) 21.8 21.2 20.8 21.6  Across hand at thumb web space 20 21.5 21.2 21.5  At base of 2nd digit  7.5 6.8 7.4  (Blank rows = not tested)  LANDMARK LEFT  eval  At axilla    15 cm proximal to olecranon process 38.4  10 cm proximal to olecranon  process 37.3  Olecranon process 32  15 cm proximal to ulnar styloid process 30.2  10 cm proximal to ulnar styloid process 27.3  Just proximal to ulnar styloid process 20.2  Across hand at thumb web space 20.1  At base of 2nd digit 7.3  (Blank rows = not tested)  Lt: Rt: Difference of:  8.52ml length                                                                                                                             TREATMENT DATE:  06/05/24 Manual Therapy Removed bandages   Showed pt tribute, caresia, and profile, applied caresia with 2 bandages on forearm (PT's caresia) and removed after about with good indentions noted to show how it would be effective at fibrosis reduction.  Decided to do self pay for a tribute L hand to axilla Then PT watched pt apply garment with cueing as needed. Trouble with getting foam situated and worked on appropriate pressure of the bandages and direction of fingers.  Overall pt is doing all steps well but was more so just laying the bandages down vs applying stretch of 50%.  Then PT reapplied.   Compression Bandaging to Rt UE: Cocoa butter, TG soft, artiflex to elbow, 1/4 gray foam to lower and upper arm  fixated with Idealbinde, Molelast to fingers 1-4, artiflex to hand, then short stretch compression bandages as follows: 1- 6 cm to hand/wrist with 1/2 gray foam to dorsal hand/wrist, 1- 10 cm spiral with X at elbow, 1- 12 cm herring bone fashion from wrist to upper arm, then last 12 cm sprial from wrist to axilla and folded TG soft over top of bandage.  06/03/2024 Pt came in unwrapped. Removed bandages at home today Manual Therapy Removed bandages   MLD to Rt UE: Short neck, superficial and deep abdominals, Lt axillary and pectoral nodes,  anterior inter-axillary anastomosis, Rt inguinal nodes, Rt axillo-inguinal anastomosis, then Rt UE working from proximal to distal  (lateral upper arm, inner to outer and lateral again;  antecubital fossa, ant/post forearm and dorsal wrist/hand and fingers) and retracing all steps back to anastomosis and lymph nodes. Compression Bandaging to Rt UE: Cocoa butter, TG soft, artiflex to elbow, 1/4 gray foam to lower and upper arm fixated with Idealbinde, Molelast to fingers 1-4, artiflex to hand, then short stretch compression bandages as follows: 1- 6 cm to hand/wrist with 1/2 gray foam to dorsal hand/wrist, 1- 10 cm spiral with X at elbow, 1- 12 cm herring bone fashion from wrist to upper arm, then last 12 cm sprial from wrist to axilla and folded TG soft over top of bandage. Checked capillary refill and is good. Pt reported wrap is comfortable.  05/31/24: Manual Therapy Removed bandages   MLD to Rt UE: Short neck, superficial and deep abdominals, Lt axillary and pectoral nodes, anterior intact thorax sequence, anterior inter-axillary anastomosis, Rt inguinal nodes, Rt axillo-inguinal anastomosis, then Rt UE working from proximal to distal  (lateral upper arm, inner to outer and lateral again; antecubital fossa, ant/post forearm and dorsal wrist/hand and fingers) and retracing all steps back to anastomosis and lymph nodes. P/ROM to Rt shoulder into flex, abd and D2   Compression Bandaging to Rt UE: Cocoa butter, TG soft, artiflex to elbow, 1/4 gray foam to lower and upper arm fixated with Idealbinde, Molelast to fingers 1-4, artiflex to hand, then short stretch compression bandages as follows: 1- 6 cm to hand/wrist with 1/2 gray foam to dorsal hand/wrist, 1- 10 cm spiral with X at elbow, 1- 12 cm herring bone fashion from wrist to upper arm, then last 12 cm sprial from wrist to axilla and folded TG soft over top of bandage. Pt videorecorded this with her cell phone for further reinforcement at home.   PATIENT EDUCATION:  Education details: per today's note Person educated: Patient Education method: Explanation Education comprehension: verbalized understanding  HOME EXERCISE  PROGRAM:   ASSESSMENT:  CLINICAL IMPRESSION: Pt demonstrates no more significant reductions but does report is seems smaller when we wrap it vs her so we reviewed this today.  Encouraged more tension due to all of the foam underneath and reviewed X at elbow, also letting pt know if it is easier to not do an X that is also fine.  Pt will order a tribute to help with fibrosis.    OBJECTIVE IMPAIRMENTS: decreased knowledge of condition, decreased knowledge of use of DME, decreased mobility, decreased ROM, increased edema, and increased fascial restrictions.   ACTIVITY LIMITATIONS: carrying and lifting  PARTICIPATION LIMITATIONS: meal prep, cleaning, community activity, and occupation  PERSONAL FACTORS: Time since onset of injury/illness/exacerbation and 1-2 comorbidities: complete ALND, radiation hx are also affecting patient's functional outcome.   REHAB POTENTIAL: Excellent  CLINICAL DECISION MAKING: Stable/uncomplicated  EVALUATION COMPLEXITY: Low  GOALS: Goals reviewed  with patient? Yes  SHORT TERM GOALS: Target date: 06/28/24  Pt will be ind with self compression bandaging if able Baseline: Goal status: INITIAL  2.  Pt will be ind with self MLD for the Rt UE Baseline:  Goal status: INITIAL  3.  Pt will be educated on cellulitis and lymphedema risk reduction  Baseline:  Goal status: INITIAL   LONG TERM GOALS: Target date: 07/30/24  Pt will be measured for or obtain correct flat knit garments for long term maintenance  Baseline:  Goal status: INITIAL  2.  Pt will be ind with self MLD or pursue a new pump for long term maintenance Baseline:  Goal status: INITIAL    PLAN:  PT FREQUENCY: 2-3x per week   PT DURATION: 10 weeks  PLANNED INTERVENTIONS: 97164- PT Re-evaluation, 97110-Therapeutic exercises, 97530- Therapeutic activity, 97112- Neuromuscular re-education, 97535- Self Care, [id number]- Manual therapy, Patient/Family education, Balance training, Joint  mobilization, Therapeutic exercises, Therapeutic activity, Neuromuscular re-education, Gait training, and Self Care  PLAN FOR NEXT SESSION: Cont Rt UE CDT, cont Instructing pt in self bandaging; include some stretching as able; instruct husband when he can come in bandaging. (Waiting on pump for now)  Jatavious Peppard R, PT 06/05/2024, 3:07 PM

## 2024-06-10 ENCOUNTER — Ambulatory Visit: Payer: Self-pay

## 2024-06-10 DIAGNOSIS — M25611 Stiffness of right shoulder, not elsewhere classified: Secondary | ICD-10-CM

## 2024-06-10 DIAGNOSIS — I972 Postmastectomy lymphedema syndrome: Secondary | ICD-10-CM | POA: Diagnosis not present

## 2024-06-10 DIAGNOSIS — M25612 Stiffness of left shoulder, not elsewhere classified: Secondary | ICD-10-CM

## 2024-06-10 NOTE — Therapy (Signed)
 OUTPATIENT PHYSICAL THERAPY  UPPER EXTREMITY ONCOLOGY TREATMENT  Patient Name: Sue Wong MRN: [id number] DOB:08-21-1966, 58 y.o., female Today's Date: 06/10/2024  END OF SESSION:  PT End of Session - 06/10/24 1635     Visit Number 8    Number of Visits 25    Date for PT Re-Evaluation 07/30/24    Authorization Type [employer] Approved 13 visits-05/20/2024-08/18/2024    Authorization - Visit Number 8    Authorization - Number of Visits 13    PT Start Time 1520   pt arrived late   PT Stop Time 1621    PT Time Calculation (min) 61 min    Activity Tolerance Patient tolerated treatment well    Behavior During Therapy WFL for tasks assessed/performed           Past Medical History:  Diagnosis Date   Arthritis    Breast cancer (HCC)    GERD (gastroesophageal reflux disease)    Psoriasis    [place] spotted fever    Past Surgical History:  Procedure Laterality Date   BREAST SURGERY Bilateral 1/9 and 12/27/16   Patient Active Problem List   Diagnosis Date Noted   Sepsis (HCC) 04/26/2023   Cellulitis 06/04/2020   Fever 06/04/2020   Lymphedema 06/04/2020    REFERRING PROVIDER: Ophelia Sage III, MD  REFERRING DIAG: I89.0  THERAPY DIAG:  Postmastectomy lymphedema syndrome  Stiffness of left shoulder, not elsewhere classified  ONSET DATE: 2018  Rationale for Evaluation and Treatment: Rehabilitation  SUBJECTIVE:                                                                                                                                                                                           SUBJECTIVE STATEMENT:    I think I'm doing okay with the bandaging but it never is as good as when you guys do it and my arm doesn't get as small after I take them off. I ordered the velcro compression garment that [place] suggested.   PERTINENT HISTORY: Hx of Rt breast cancer metastasized to LN with bil mastectomy 12/13/2016 and then complete lymphadenectomy 12/27/16,   radiation and chemotherapy, hx of recurrent cellulitis Rt UE, psoriatic arthritis  PAIN:  Are you having pain? No not right now.     PRECAUTIONS: recurrent Rt arm cellulitis   RED FLAGS: None   WEIGHT BEARING RESTRICTIONS: No  FALLS:  Has patient fallen in last 6 months? No  LIVING ENVIRONMENT: Lives with: lives with their family  OCCUPATION: Not working now.  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    LEISURE:   HAND  DOMINANCE: right   PRIOR LEVEL OF FUNCTION: Independent  PATIENT GOALS: try to treat this arm   OBJECTIVE: Note: Objective measures were completed at Evaluation unless otherwise noted.  COGNITION: Overall cognitive status: Within functional limits for tasks assessed   PALPATION: +1 pitting Rt dorsal hand and forearm, no pitting in the upper arm, tightness in the Rt axilla and pectoralis region  OBSERVATIONS / OTHER ASSESSMENTS: Rt arm larger with adipose appearance. Post radiation changes to Rt axilla   UPPER EXTREMITY AROM/PROM:  A/PROM RIGHT   eval   Shoulder extension   Shoulder flexion 145  Shoulder abduction   Shoulder internal rotation   Shoulder external rotation     (Blank rows = not tested)  A/PROM LEFT   eval  Shoulder extension   Shoulder flexion 160  Shoulder abduction   Shoulder internal rotation   Shoulder external rotation     (Blank rows = not tested)  LYMPHEDEMA ASSESSMENTS:   LANDMARK RIGHT  Previous visits with OT 05/20/24 05/29/24 06/05/24  At axilla       15 cm proximal to olecranon process 38 39.7 36.8 38.3  10 cm proximal to olecranon process 37.5 40.5 38.3 40.2  Olecranon process 33 37.5 34.9 35.3  15 cm proximal to ulnar styloid process 33.5 37.9 35.9 36.2  10 cm proximal to ulnar styloid process 30 34.1 33.5 33.5  Just proximal to ulnar styloid process (at wrist crease) 21.8 21.2 20.8 21.6  Across hand at thumb web space 20 21.5 21.2 21.5  At base of 2nd digit  7.5 6.8 7.4  (Blank rows =  not tested)  LANDMARK LEFT  eval  At axilla    15 cm proximal to olecranon process 38.4  10 cm proximal to olecranon process 37.3  Olecranon process 32  15 cm proximal to ulnar styloid process 30.2  10 cm proximal to ulnar styloid process 27.3  Just proximal to ulnar styloid process 20.2  Across hand at thumb web space 20.1  At base of 2nd digit 7.3  (Blank rows = not tested)  Lt: Rt: Difference of:  8.10ml length                                                                                                                             TREATMENT DATE:  06/10/24: Manual Therapy Removed bandages   MLD to Rt UE: Short neck, superficial and deep abdominals, Lt axillary and pectoral nodes, anterior intact thorax sequence, anterior inter-axillary anastomosis, Rt inguinal nodes, Rt axillo-inguinal anastomosis, then Rt UE working from proximal to distal  (lateral upper arm, inner to outer and lateral again; antecubital fossa, ant/post forearm and dorsal wrist/hand and fingers) and retracing all steps back to anastomosis and lymph nodes. P/ROM to Rt shoulder into flex, abd and D2 during MLD STM to palpable tightness in Rt axilla and at chest wall Compression Bandaging to Rt UE: Cocoa butter, TG soft, artiflex to elbow, 1/4  gray foam to lower and upper arm fixated with Idealbinde, Molelast to fingers 1-4, artiflex to hand, then short stretch compression bandages as follows: 1- 6 cm to hand/wrist with 1/2 gray foam to dorsal hand/wrist, 1- 10 cm spiral with X at elbow, 1- 12 cm herring bone fashion from wrist to upper arm, then last 12 cm sprial from wrist to axilla and folded. TG soft over top of bandage.  06/05/24 Manual Therapy Removed bandages   Showed pt tribute, caresia, and profile, applied caresia with 2 bandages on forearm (PT's caresia) and removed after about with good indentions noted to show how it would be effective at fibrosis reduction.  Decided to do  self pay for a tribute L hand to axilla Then PT watched pt apply garment with cueing as needed. Trouble with getting foam situated and worked on appropriate pressure of the bandages and direction of fingers.  Overall pt is doing all steps well but was more so just laying the bandages down vs applying stretch of 50%.  Then PT reapplied.   Compression Bandaging to Rt UE: Cocoa butter, TG soft, artiflex to elbow, 1/4 gray foam to lower and upper arm fixated with Idealbinde, Molelast to fingers 1-4, artiflex to hand, then short stretch compression bandages as follows: 1- 6 cm to hand/wrist with 1/2 gray foam to dorsal hand/wrist, 1- 10 cm spiral with X at elbow, 1- 12 cm herring bone fashion from wrist to upper arm, then last 12 cm sprial from wrist to axilla and folded TG soft over top of bandage.  06/03/2024 Pt came in unwrapped. Removed bandages at home today Manual Therapy Removed bandages   MLD to Rt UE: Short neck, superficial and deep abdominals, Lt axillary and pectoral nodes,  anterior inter-axillary anastomosis, Rt inguinal nodes, Rt axillo-inguinal anastomosis, then Rt UE working from proximal to distal  (lateral upper arm, inner to outer and lateral again; antecubital fossa, ant/post forearm and dorsal wrist/hand and fingers) and retracing all steps back to anastomosis and lymph nodes. Compression Bandaging to Rt UE: Cocoa butter, TG soft, artiflex to elbow, 1/4 gray foam to lower and upper arm fixated with Idealbinde, Molelast to fingers 1-4, artiflex to hand, then short stretch compression bandages as follows: 1- 6 cm to hand/wrist with 1/2 gray foam to dorsal hand/wrist, 1- 10 cm spiral with X at elbow, 1- 12 cm herring bone fashion from wrist to upper arm, then last 12 cm sprial from wrist to axilla and folded TG soft over top of bandage. Checked capillary refill and is good. Pt reported wrap is comfortable.  05/31/24: Manual Therapy Removed bandages   MLD to Rt UE: Short neck,  superficial and deep abdominals, Lt axillary and pectoral nodes, anterior intact thorax sequence, anterior inter-axillary anastomosis, Rt inguinal nodes, Rt axillo-inguinal anastomosis, then Rt UE working from proximal to distal  (lateral upper arm, inner to outer and lateral again; antecubital fossa, ant/post forearm and dorsal wrist/hand and fingers) and retracing all steps back to anastomosis and lymph nodes. P/ROM to Rt shoulder into flex, abd and D2   Compression Bandaging to Rt UE: Cocoa butter, TG soft, artiflex to elbow, 1/4 gray foam to lower and upper arm fixated with Idealbinde, Molelast to fingers 1-4, artiflex to hand, then short stretch compression bandages as follows: 1- 6 cm to hand/wrist with 1/2 gray foam to dorsal hand/wrist, 1- 10 cm spiral with X at elbow, 1- 12 cm herring bone fashion from wrist to upper arm, then last 12 cm  sprial from wrist to axilla and folded TG soft over top of bandage. Pt videorecorded this with her cell phone for further reinforcement at home.   PATIENT EDUCATION:  Education details: per today's note Person educated: Patient Education method: Explanation Education comprehension: verbalized understanding  HOME EXERCISE PROGRAM:   ASSESSMENT:  CLINICAL IMPRESSION: Pt has ordered the velcro compression garment as of this morning and it says 4-6 days to arrive. She plans to try wearing that between appts as she is struggling with self bandaging be as beneficial as when we do it at the clinic. Today continued with Rt UE CDT and P/ROM to Rt shoulder with STM to surrounding tissues, see above. Discussed pt getting measured for flat knit garments on 7/22 when fitter will be at the clinic and pt is agreeable to this. At first she was nervous about getting another sleeve and glove due to her past experience with garments being painful, uncomfortable and digging into her arm. However, after further discussion and showing pt the difference between flat vs  circular, it seems she may have had a circular knit garment before. Explained to pt that this would have explained how that sleeve felt on her arm and even potentially exacerbated her lymphedema. So once she had a better understanding of flat knit garments pt was agreeable to being measured for this.   OBJECTIVE IMPAIRMENTS: decreased knowledge of condition, decreased knowledge of use of DME, decreased mobility, decreased ROM, increased edema, and increased fascial restrictions.   ACTIVITY LIMITATIONS: carrying and lifting  PARTICIPATION LIMITATIONS: meal prep, cleaning, community activity, and occupation  PERSONAL FACTORS: Time since onset of injury/illness/exacerbation and 1-2 comorbidities: complete ALND, radiation hx are also affecting patient's functional outcome.   REHAB POTENTIAL: Excellent  CLINICAL DECISION MAKING: Stable/uncomplicated  EVALUATION COMPLEXITY: Low  GOALS: Goals reviewed with patient? Yes  SHORT TERM GOALS: Target date: 06/28/24  Pt will be ind with self compression bandaging if able Baseline: Goal status: INITIAL  2.  Pt will be ind with self MLD for the Rt UE Baseline:  Goal status: INITIAL  3.  Pt will be educated on cellulitis and lymphedema risk reduction  Baseline:  Goal status: INITIAL   LONG TERM GOALS: Target date: 07/30/24  Pt will be measured for or obtain correct flat knit garments for long term maintenance  Baseline:  Goal status: INITIAL  2.  Pt will be ind with self MLD or pursue a new pump for long term maintenance Baseline:  Goal status: INITIAL    PLAN:  PT FREQUENCY: 2-3x per week   PT DURATION: 10 weeks  PLANNED INTERVENTIONS: 97164- PT Re-evaluation, 97110-Therapeutic exercises, 97530- Therapeutic activity, 97112- Neuromuscular re-education, 97535- Self Care, [id number]- Manual therapy, Patient/Family education, Balance training, Joint mobilization, Therapeutic exercises, Therapeutic activity, Neuromuscular re-education, Gait  training, and Self Care  PLAN FOR NEXT SESSION: Assess fit of velcro garment once this arrive; Possibly have pt measured for flat knit garments on 7/22 when [place] from [place] is here if her circumference measurements have plateaued; Cont Rt UE CDT, cont Instructing pt in self bandaging; include some stretching as able; instruct husband when he can come. (Waiting on pump for now)  Aden Berwyn Caldron, PTA 06/10/2024, 4:48 PM

## 2024-06-12 ENCOUNTER — Ambulatory Visit: Payer: Self-pay

## 2024-06-12 DIAGNOSIS — M25612 Stiffness of left shoulder, not elsewhere classified: Secondary | ICD-10-CM

## 2024-06-12 DIAGNOSIS — M25611 Stiffness of right shoulder, not elsewhere classified: Secondary | ICD-10-CM

## 2024-06-12 DIAGNOSIS — I972 Postmastectomy lymphedema syndrome: Secondary | ICD-10-CM | POA: Diagnosis not present

## 2024-06-12 LAB — COLOGUARD: COLOGUARD: NEGATIVE

## 2024-06-12 NOTE — Therapy (Signed)
 OUTPATIENT PHYSICAL THERAPY  UPPER EXTREMITY ONCOLOGY TREATMENT  Patient Name: Sue Wong MRN: [id number] DOB:Jul 11, 1966, 58 y.o., female Today's Date: 06/12/2024  END OF SESSION:  PT End of Session - 06/12/24 1508     Visit Number 9    Number of Visits 25    Date for PT Re-Evaluation 07/30/24    Authorization Type [employer] Approved 13 visits-05/20/2024-08/18/2024    Authorization - Visit Number 9    Authorization - Number of Visits 13    PT Start Time 1501    PT Stop Time 1601    PT Time Calculation (min) 60 min    Activity Tolerance Patient tolerated treatment well    Behavior During Therapy WFL for tasks assessed/performed           Past Medical History:  Diagnosis Date   Arthritis    Breast cancer (HCC)    GERD (gastroesophageal reflux disease)    Psoriasis    [place] spotted fever    Past Surgical History:  Procedure Laterality Date   BREAST SURGERY Bilateral 1/9 and 12/27/16   Patient Active Problem List   Diagnosis Date Noted   Sepsis (HCC) 04/26/2023   Cellulitis 06/04/2020   Fever 06/04/2020   Lymphedema 06/04/2020    REFERRING PROVIDER: Ophelia Sage III, MD  REFERRING DIAG: I89.0  THERAPY DIAG:  Postmastectomy lymphedema syndrome  Stiffness of left shoulder, not elsewhere classified  ONSET DATE: 2018  Rationale for Evaluation and Treatment: Rehabilitation  SUBJECTIVE:                                                                                                                                                                                           SUBJECTIVE STATEMENT:  I just ordered the garment Monday so haven't heard about shipping yet. I'm eager to see if that keeps my arm down as much as the bandages.   PERTINENT HISTORY: Hx of Rt breast cancer metastasized to LN with bil mastectomy 12/13/2016 and then complete lymphadenectomy 12/27/16,  radiation and chemotherapy, hx of recurrent cellulitis Rt UE, psoriatic arthritis  PAIN:   Are you having pain? No not right now.     PRECAUTIONS: recurrent Rt arm cellulitis   RED FLAGS: None   WEIGHT BEARING RESTRICTIONS: No  FALLS:  Has patient fallen in last 6 months? No  LIVING ENVIRONMENT: Lives with: lives with their family  OCCUPATION: Not working now.  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    LEISURE:   HAND DOMINANCE: right   PRIOR LEVEL OF FUNCTION: Independent  PATIENT GOALS: try to treat this arm   OBJECTIVE:  Note: Objective measures were completed at Evaluation unless otherwise noted.  COGNITION: Overall cognitive status: Within functional limits for tasks assessed   PALPATION: +1 pitting Rt dorsal hand and forearm, no pitting in the upper arm, tightness in the Rt axilla and pectoralis region  OBSERVATIONS / OTHER ASSESSMENTS: Rt arm larger with adipose appearance. Post radiation changes to Rt axilla   UPPER EXTREMITY AROM/PROM:  A/PROM RIGHT   eval   Shoulder extension   Shoulder flexion 145  Shoulder abduction   Shoulder internal rotation   Shoulder external rotation     (Blank rows = not tested)  A/PROM LEFT   eval  Shoulder extension   Shoulder flexion 160  Shoulder abduction   Shoulder internal rotation   Shoulder external rotation     (Blank rows = not tested)  LYMPHEDEMA ASSESSMENTS:   LANDMARK RIGHT  Previous visits with OT 05/20/24 05/29/24 06/05/24 06/12/24  At axilla        15 cm proximal to olecranon process 38 39.7 36.8 38.3 36.7  10 cm proximal to olecranon process 37.5 40.5 38.3 40.2 37.6  Olecranon process 33 37.5 34.9 35.3 34.4  15 cm proximal to ulnar styloid process 33.5 37.9 35.9 36.2 35.7  10 cm proximal to ulnar styloid process 30 34.1 33.5 33.5 33.1  Just proximal to ulnar styloid process (at wrist crease) 21.8 21.2 20.8 21.6 21.8  Across hand at thumb web space 20 21.5 21.2 21.5 20.4  At base of 2nd digit  7.5 6.8 7.4 7.2  (Blank rows = not tested)  LANDMARK LEFT  eval  At  axilla    15 cm proximal to olecranon process 38.4  10 cm proximal to olecranon process 37.3  Olecranon process 32  15 cm proximal to ulnar styloid process 30.2  10 cm proximal to ulnar styloid process 27.3  Just proximal to ulnar styloid process 20.2  Across hand at thumb web space 20.1  At base of 2nd digit 7.3  (Blank rows = not tested)  Lt: Rt: Difference of:  8.64ml length                                                                                                                             TREATMENT DATE:  06/12/24: Manual Therapy Pt conts with excellent circumferential reductions since start of care. Removed pts bandages MLD to Rt UE: Short neck, superficial and deep abdominals, Lt axillary and pectoral nodes, anterior intact thorax sequence, anterior inter-axillary anastomosis, Rt inguinal nodes, Rt axillo-inguinal anastomosis, then Rt UE working from proximal to distal  (lateral upper arm, inner to outer and lateral again; antecubital fossa, ant/post forearm and dorsal wrist/hand and fingers) and retracing all steps back to anastomosis and lymph nodes. P/ROM to Rt shoulder into flex, abd and D2 during MLD with scapular depression by therapist STM to palpable tightness in Rt axilla and at chest wall Compression Bandaging to Rt UE: Cocoa butter, TG soft,  artiflex to elbow, 1/4 gray foam to lower and upper arm fixated with Idealbinde, Molelast to fingers 1-4, artiflex to hand, then short stretch compression bandages as follows: 1- 6 cm to hand/wrist with 1/2 gray foam to dorsal hand/wrist, 1- 10 cm spiral with X at elbow, 1- 12 cm herring bone fashion from wrist to upper arm, then last 12 cm sprial from wrist to axilla and folded. TG soft over top of bandage.  06/10/24: Manual Therapy Removed bandages   MLD to Rt UE: Short neck, superficial and deep abdominals, Lt axillary and pectoral nodes, anterior intact thorax sequence, anterior inter-axillary  anastomosis, Rt inguinal nodes, Rt axillo-inguinal anastomosis, then Rt UE working from proximal to distal  (lateral upper arm, inner to outer and lateral again; antecubital fossa, ant/post forearm and dorsal wrist/hand and fingers) and retracing all steps back to anastomosis and lymph nodes. P/ROM to Rt shoulder into flex, abd and D2 during MLD STM to palpable tightness in Rt axilla and at chest wall Compression Bandaging to Rt UE: Cocoa butter, TG soft, artiflex to elbow, 1/4 gray foam to lower and upper arm fixated with Idealbinde, Molelast to fingers 1-4, artiflex to hand, then short stretch compression bandages as follows: 1- 6 cm to hand/wrist with 1/2 gray foam to dorsal hand/wrist, 1- 10 cm spiral with X at elbow, 1- 12 cm herring bone fashion from wrist to upper arm, then last 12 cm sprial from wrist to axilla and folded. TG soft over top of bandage.  06/05/24 Manual Therapy Removed bandages   Showed pt tribute, caresia, and profile, applied caresia with 2 bandages on forearm (PT's caresia) and removed after about with good indentions noted to show how it would be effective at fibrosis reduction.  Decided to do self pay for a tribute L hand to axilla Then PT watched pt apply garment with cueing as needed. Trouble with getting foam situated and worked on appropriate pressure of the bandages and direction of fingers.  Overall pt is doing all steps well but was more so just laying the bandages down vs applying stretch of 50%.  Then PT reapplied.   Compression Bandaging to Rt UE: Cocoa butter, TG soft, artiflex to elbow, 1/4 gray foam to lower and upper arm fixated with Idealbinde, Molelast to fingers 1-4, artiflex to hand, then short stretch compression bandages as follows: 1- 6 cm to hand/wrist with 1/2 gray foam to dorsal hand/wrist, 1- 10 cm spiral with X at elbow, 1- 12 cm herring bone fashion from wrist to upper arm, then last 12 cm sprial from wrist to axilla and folded TG soft  over top of bandage.    PATIENT EDUCATION:  Education details: per today's note Person educated: Patient Education method: Explanation Education comprehension: verbalized understanding  HOME EXERCISE PROGRAM:   ASSESSMENT:  CLINICAL IMPRESSION: Continued with Rt UE CDT with gray foam. Pt is tolerating this well with no skin irritation and she conts with circumferential reductions weekly at this time. For now plan to have her measured for flat knit compression garments on 7/22 when [facility] is here from [place]. Also continued with P/ROM of Rt shoulder as pts end ROM conts to be limited due to fascial restrictions.   OBJECTIVE IMPAIRMENTS: decreased knowledge of condition, decreased knowledge of use of DME, decreased mobility, decreased ROM, increased edema, and increased fascial restrictions.   ACTIVITY LIMITATIONS: carrying and lifting  PARTICIPATION LIMITATIONS: meal prep, cleaning, community activity, and occupation  PERSONAL FACTORS: Time since onset of injury/illness/exacerbation and  1-2 comorbidities: complete ALND, radiation hx are also affecting patient's functional outcome.   REHAB POTENTIAL: Excellent  CLINICAL DECISION MAKING: Stable/uncomplicated  EVALUATION COMPLEXITY: Low  GOALS: Goals reviewed with patient? Yes  SHORT TERM GOALS: Target date: 06/28/24  Pt will be ind with self compression bandaging if able Baseline: Goal status: INITIAL  2.  Pt will be ind with self MLD for the Rt UE Baseline:  Goal status: INITIAL  3.  Pt will be educated on cellulitis and lymphedema risk reduction  Baseline:  Goal status: INITIAL   LONG TERM GOALS: Target date: 07/30/24  Pt will be measured for or obtain correct flat knit garments for long term maintenance  Baseline:  Goal status: INITIAL  2.  Pt will be ind with self MLD or pursue a new pump for long term maintenance Baseline:  Goal status: INITIAL    PLAN:  PT FREQUENCY: 2-3x per week   PT DURATION: 10  weeks  PLANNED INTERVENTIONS: 97164- PT Re-evaluation, 97110-Therapeutic exercises, 97530- Therapeutic activity, 97112- Neuromuscular re-education, 97535- Self Care, [id number]- Manual therapy, Patient/Family education, Balance training, Joint mobilization, Therapeutic exercises, Therapeutic activity, Neuromuscular re-education, Gait training, and Self Care  PLAN FOR NEXT SESSION: Assess fit of velcro garment once this arrive; Possibly have pt measured for flat knit garments on 7/22 when [place] from [place] is here if her circumference measurements have plateaued; Cont Rt UE CDT, cont Instructing pt in self bandaging; include some stretching as able; instruct husband when he can come. (Waiting on pump for now)  Aden Berwyn Caldron, PTA 06/12/2024, 5:22 PM

## 2024-06-14 ENCOUNTER — Ambulatory Visit: Payer: Self-pay

## 2024-06-17 ENCOUNTER — Ambulatory Visit: Payer: Self-pay

## 2024-06-17 DIAGNOSIS — I972 Postmastectomy lymphedema syndrome: Secondary | ICD-10-CM

## 2024-06-17 DIAGNOSIS — M25612 Stiffness of left shoulder, not elsewhere classified: Secondary | ICD-10-CM

## 2024-06-17 DIAGNOSIS — M25611 Stiffness of right shoulder, not elsewhere classified: Secondary | ICD-10-CM

## 2024-06-17 NOTE — Therapy (Signed)
 OUTPATIENT PHYSICAL THERAPY  UPPER EXTREMITY ONCOLOGY TREATMENT  Patient Name: Sue Wong MRN: [id number] DOB:09-12-1966, 58 y.o., female Today's Date: 06/17/2024  END OF SESSION:  PT End of Session - 06/17/24 1406     Visit Number 10    Number of Visits 25    Date for PT Re-Evaluation 07/30/24    Authorization Type [employer] Approved 13 visits-05/20/2024-08/18/2024    Authorization - Visit Number 10    Authorization - Number of Visits 13    PT Start Time 1402    PT Stop Time 1457    PT Time Calculation (min) 55 min    Activity Tolerance Patient tolerated treatment well    Behavior During Therapy WFL for tasks assessed/performed           Past Medical History:  Diagnosis Date   Arthritis    Breast cancer (HCC)    GERD (gastroesophageal reflux disease)    Psoriasis    [place] spotted fever    Past Surgical History:  Procedure Laterality Date   BREAST SURGERY Bilateral 1/9 and 12/27/16   Patient Active Problem List   Diagnosis Date Noted   Sepsis (HCC) 04/26/2023   Cellulitis 06/04/2020   Fever 06/04/2020   Lymphedema 06/04/2020    REFERRING PROVIDER: Ophelia Sage III, MD  REFERRING DIAG: I89.0  THERAPY DIAG:  Postmastectomy lymphedema syndrome  Stiffness of left shoulder, not elsewhere classified  ONSET DATE: 2018  Rationale for Evaluation and Treatment: Rehabilitation  SUBJECTIVE:                                                                                                                                                                                           SUBJECTIVE STATEMENT:  I got my new velcro garment! I just put it on before I came so you can check it.   PERTINENT HISTORY: Hx of Rt breast cancer metastasized to LN with bil mastectomy 12/13/2016 and then complete lymphadenectomy 12/27/16,  radiation and chemotherapy, hx of recurrent cellulitis Rt UE, psoriatic arthritis  PAIN:  Are you having pain? No not right now.      PRECAUTIONS: recurrent Rt arm cellulitis   RED FLAGS: None   WEIGHT BEARING RESTRICTIONS: No  FALLS:  Has patient fallen in last 6 months? No  LIVING ENVIRONMENT: Lives with: lives with their family  OCCUPATION: Not working now.  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    LEISURE:   HAND DOMINANCE: right   PRIOR LEVEL OF FUNCTION: Independent  PATIENT GOALS: try to treat this arm   OBJECTIVE: Note: Objective measures were completed at Evaluation unless  otherwise noted.  COGNITION: Overall cognitive status: Within functional limits for tasks assessed   PALPATION: +1 pitting Rt dorsal hand and forearm, no pitting in the upper arm, tightness in the Rt axilla and pectoralis region  OBSERVATIONS / OTHER ASSESSMENTS: Rt arm larger with adipose appearance. Post radiation changes to Rt axilla   UPPER EXTREMITY AROM/PROM:  A/PROM RIGHT   eval   Shoulder extension   Shoulder flexion 145  Shoulder abduction   Shoulder internal rotation   Shoulder external rotation     (Blank rows = not tested)  A/PROM LEFT   eval  Shoulder extension   Shoulder flexion 160  Shoulder abduction   Shoulder internal rotation   Shoulder external rotation     (Blank rows = not tested)  LYMPHEDEMA ASSESSMENTS:   LANDMARK RIGHT  Previous visits with OT 05/20/24 05/29/24 06/05/24 06/12/24 06/17/24  At axilla         15 cm proximal to olecranon process 38 39.7 36.8 38.3 36.7 36.4  10 cm proximal to olecranon process 37.5 40.5 38.3 40.2 37.6 37.3  Olecranon process 33 37.5 34.9 35.3 34.4 34.9  15 cm proximal to ulnar styloid process 33.5 37.9 35.9 36.2 35.7 35.2  10 cm proximal to ulnar styloid process 30 34.1 33.5 33.5 33.1 32.9  Just proximal to ulnar styloid process (at wrist crease) 21.8 21.2 20.8 21.6 21.8 20.4  Across hand at thumb web space 20 21.5 21.2 21.5 20.4 20.8  At base of 2nd digit  7.5 6.8 7.4 7.2 6.8  (Blank rows = not tested)  LANDMARK LEFT  eval   At axilla    15 cm proximal to olecranon process 38.4  10 cm proximal to olecranon process 37.3  Olecranon process 32  15 cm proximal to ulnar styloid process 30.2  10 cm proximal to ulnar styloid process 27.3  Just proximal to ulnar styloid process 20.2  Across hand at thumb web space 20.1  At base of 2nd digit 7.3  (Blank rows = not tested)  Lt: Rt: Difference of:  8.76ml length                                                                                                                             TREATMENT DATE:  06/17/24: Self Care Pt brought in her new Alphonzo Cools and assessed fit of this. This fits pt very well and she reports this feeling comfortable. Adjusted straps so she could tell what's a comfortable amount of compression.  Therapeutic Exercises Pulleys into flex and abd x 2 mins each returning therapist demo and tactile cues for correct technique Roll yellow ball up wall x 10 into flex and Rt abd each returning therapist demo Manual Therapy MLD to Rt UE: Short neck, superficial and deep abdominals, Lt axillary and pectoral nodes, anterior intact thorax sequence, anterior inter-axillary anastomosis, Rt inguinal nodes, Rt axillo-inguinal anastomosis, then Rt UE working from proximal to distal  (lateral  upper arm, inner to outer and lateral again; antecubital fossa, ant/post forearm and dorsal wrist/hand and fingers) and retracing all steps back to anastomosis and lymph nodes. P/ROM to Rt shoulder into flex, abd and D2 during MLD with scapular depression by therapist STM to palpable tightness in Rt axilla and at chest wall  06/12/24: Manual Therapy Pt conts with excellent circumferential reductions since start of care. Removed pts bandages MLD to Rt UE: Short neck, superficial and deep abdominals, Lt axillary and pectoral nodes, anterior intact thorax sequence, anterior inter-axillary anastomosis, Rt inguinal nodes, Rt axillo-inguinal anastomosis,  then Rt UE working from proximal to distal  (lateral upper arm, inner to outer and lateral again; antecubital fossa, ant/post forearm and dorsal wrist/hand and fingers) and retracing all steps back to anastomosis and lymph nodes. P/ROM to Rt shoulder into flex, abd and D2 during MLD with scapular depression by therapist STM to palpable tightness in Rt axilla and at chest wall Compression Bandaging to Rt UE: Cocoa butter, TG soft, artiflex to elbow, 1/4 gray foam to lower and upper arm fixated with Idealbinde, Molelast to fingers 1-4, artiflex to hand, then short stretch compression bandages as follows: 1- 6 cm to hand/wrist with 1/2 gray foam to dorsal hand/wrist, 1- 10 cm spiral with X at elbow, 1- 12 cm herring bone fashion from wrist to upper arm, then last 12 cm sprial from wrist to axilla and folded. TG soft over top of bandage.  06/10/24: Manual Therapy Removed bandages   MLD to Rt UE: Short neck, superficial and deep abdominals, Lt axillary and pectoral nodes, anterior intact thorax sequence, anterior inter-axillary anastomosis, Rt inguinal nodes, Rt axillo-inguinal anastomosis, then Rt UE working from proximal to distal  (lateral upper arm, inner to outer and lateral again; antecubital fossa, ant/post forearm and dorsal wrist/hand and fingers) and retracing all steps back to anastomosis and lymph nodes. P/ROM to Rt shoulder into flex, abd and D2 during MLD STM to palpable tightness in Rt axilla and at chest wall Compression Bandaging to Rt UE: Cocoa butter, TG soft, artiflex to elbow, 1/4 gray foam to lower and upper arm fixated with Idealbinde, Molelast to fingers 1-4, artiflex to hand, then short stretch compression bandages as follows: 1- 6 cm to hand/wrist with 1/2 gray foam to dorsal hand/wrist, 1- 10 cm spiral with X at elbow, 1- 12 cm herring bone fashion from wrist to upper arm, then last 12 cm sprial from wrist to axilla and folded. TG soft over top of bandage.      PATIENT  EDUCATION:  Education details: per today's note Person educated: Patient Education method: Explanation Education comprehension: verbalized understanding  HOME EXERCISE PROGRAM:   ASSESSMENT:  CLINICAL IMPRESSION: Pt conts with good circumferential reductions. Educated her on proper donning of Alphonzo Cools and how much to tighten straps. Added ther ex for Rt shoulder AA/ROM. Then continued with MLD and P/ROM of Rt UE. Assisted pt with donning Alphonzo Cools after session.   OBJECTIVE IMPAIRMENTS: decreased knowledge of condition, decreased knowledge of use of DME, decreased mobility, decreased ROM, increased edema, and increased fascial restrictions.   ACTIVITY LIMITATIONS: carrying and lifting  PARTICIPATION LIMITATIONS: meal prep, cleaning, community activity, and occupation  PERSONAL FACTORS: Time since onset of injury/illness/exacerbation and 1-2 comorbidities: complete ALND, radiation hx are also affecting patient's functional outcome.   REHAB POTENTIAL: Excellent  CLINICAL DECISION MAKING: Stable/uncomplicated  EVALUATION COMPLEXITY: Low  GOALS: Goals reviewed with patient? Yes  SHORT TERM GOALS: Target date: 06/28/24  Pt will  be ind with self compression bandaging if able Baseline: Goal status: INITIAL  2.  Pt will be ind with self MLD for the Rt UE Baseline:  Goal status: INITIAL  3.  Pt will be educated on cellulitis and lymphedema risk reduction  Baseline:  Goal status: INITIAL   LONG TERM GOALS: Target date: 07/30/24  Pt will be measured for or obtain correct flat knit garments for long term maintenance  Baseline:  Goal status: INITIAL  2.  Pt will be ind with self MLD or pursue a new pump for long term maintenance Baseline:  Goal status: INITIAL    PLAN:  PT FREQUENCY: 2-3x per week   PT DURATION: 10 weeks  PLANNED INTERVENTIONS: 97164- PT Re-evaluation, 97110-Therapeutic exercises, 97530- Therapeutic activity, 97112- Neuromuscular re-education,  97535- Self Care, 02859- Manual therapy, Patient/Family education, Balance training, Joint mobilization, Therapeutic exercises, Therapeutic activity, Neuromuscular re-education, Gait training, and Self Care  PLAN FOR NEXT SESSION: Remeasure circumference to assess efficacy of velcro garment. Possibly have pt measured for flat knit garments on 7/22 when [place] from [place] is here if her circumference measurements have plateaued; Cont Rt UE CDT, cont Instructing pt in self bandaging; include some stretching as able; instruct husband when he can come. (Waiting on pump for now)  Aden Berwyn Caldron, PTA 06/17/2024, 3:02 PM

## 2024-06-19 ENCOUNTER — Ambulatory Visit: Payer: Self-pay

## 2024-06-19 DIAGNOSIS — I972 Postmastectomy lymphedema syndrome: Secondary | ICD-10-CM | POA: Diagnosis not present

## 2024-06-19 DIAGNOSIS — M25611 Stiffness of right shoulder, not elsewhere classified: Secondary | ICD-10-CM

## 2024-06-19 NOTE — Therapy (Signed)
 OUTPATIENT PHYSICAL THERAPY  UPPER EXTREMITY ONCOLOGY TREATMENT  Patient Name: Sue Wong MRN: [id number] DOB:11-21-1966, 58 y.o., female Today's Date: 06/19/2024  END OF SESSION:  PT End of Session - 06/19/24 1507     Visit Number 11    Number of Visits 25    Date for PT Re-Evaluation 07/30/24    Authorization Type [employer] Approved 13 visits-05/20/2024-08/18/2024    Authorization - Visit Number 11    Authorization - Number of Visits 13    PT Start Time 1502    PT Stop Time 1557    PT Time Calculation (min) 55 min    Activity Tolerance Patient tolerated treatment well    Behavior During Therapy WFL for tasks assessed/performed           Past Medical History:  Diagnosis Date   Arthritis    Breast cancer (HCC)    GERD (gastroesophageal reflux disease)    Psoriasis    [place] spotted fever    Past Surgical History:  Procedure Laterality Date   BREAST SURGERY Bilateral 1/9 and 12/27/16   Patient Active Problem List   Diagnosis Date Noted   Sepsis (HCC) 04/26/2023   Cellulitis 06/04/2020   Fever 06/04/2020   Lymphedema 06/04/2020    REFERRING PROVIDER: Ophelia Sage III, MD  REFERRING DIAG: I89.0  THERAPY DIAG:  Postmastectomy lymphedema syndrome  Stiffness of right shoulder, not elsewhere classified  ONSET DATE: 2018  Rationale for Evaluation and Treatment: Rehabilitation  SUBJECTIVE:                                                                                                                                                                                           SUBJECTIVE STATEMENT:  I'm liking the new garment. It's nice having a bit more flexibility with being able to take it off for a short time and then put it right back on as opposed to the bandages as it takes so much longer to put on.   PERTINENT HISTORY: Hx of Rt breast cancer metastasized to LN with bil mastectomy 12/13/2016 and then complete lymphadenectomy 12/27/16,  radiation and  chemotherapy, hx of recurrent cellulitis Rt UE, psoriatic arthritis  PAIN:  Are you having pain? No not right now.     PRECAUTIONS: recurrent Rt arm cellulitis   RED FLAGS: None   WEIGHT BEARING RESTRICTIONS: No  FALLS:  Has patient fallen in last 6 months? No  LIVING ENVIRONMENT: Lives with: lives with their family  OCCUPATION: Not working now.  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    LEISURE:   HAND DOMINANCE: right  PRIOR LEVEL OF FUNCTION: Independent  PATIENT GOALS: try to treat this arm   OBJECTIVE: Note: Objective measures were completed at Evaluation unless otherwise noted.  COGNITION: Overall cognitive status: Within functional limits for tasks assessed   PALPATION: +1 pitting Rt dorsal hand and forearm, no pitting in the upper arm, tightness in the Rt axilla and pectoralis region  OBSERVATIONS / OTHER ASSESSMENTS: Rt arm larger with adipose appearance. Post radiation changes to Rt axilla   UPPER EXTREMITY AROM/PROM:  A/PROM RIGHT   eval   Shoulder extension   Shoulder flexion 145  Shoulder abduction   Shoulder internal rotation   Shoulder external rotation     (Blank rows = not tested)  A/PROM LEFT   eval  Shoulder extension   Shoulder flexion 160  Shoulder abduction   Shoulder internal rotation   Shoulder external rotation     (Blank rows = not tested)  LYMPHEDEMA ASSESSMENTS:   LANDMARK RIGHT  Previous visits with OT 05/20/24 05/29/24 06/05/24 06/12/24 06/17/24 06/19/24  At axilla          15 cm proximal to olecranon process 38 39.7 36.8 38.3 36.7 36.4 36.7  10 cm proximal to olecranon process 37.5 40.5 38.3 40.2 37.6 37.3 38.1  Olecranon process 33 37.5 34.9 35.3 34.4 34.9 34.4  15 cm proximal to ulnar styloid process 33.5 37.9 35.9 36.2 35.7 35.2 35.6  10 cm proximal to ulnar styloid process 30 34.1 33.5 33.5 33.1 32.9 33.5  Just proximal to ulnar styloid process (at wrist crease) 21.8 21.2 20.8 21.6 21.8 20.4 20.4   Across hand at thumb web space 20 21.5 21.2 21.5 20.4 20.8 20.7  At base of 2nd digit  7.5 6.8 7.4 7.2 6.8 6.7  (Blank rows = not tested)  LANDMARK LEFT  eval  At axilla    15 cm proximal to olecranon process 38.4  10 cm proximal to olecranon process 37.3  Olecranon process 32  15 cm proximal to ulnar styloid process 30.2  10 cm proximal to ulnar styloid process 27.3  Just proximal to ulnar styloid process 20.2  Across hand at thumb web space 20.1  At base of 2nd digit 7.3  (Blank rows = not tested)  Lt: Rt: Difference of:  8.59ml length                                                                                                                             TREATMENT DATE:  06/19/24: Manual Therapy MLD to Rt UE: Short neck, superficial and deep abdominals, Lt axillary and pectoral nodes, anterior intact thorax sequence, anterior inter-axillary anastomosis, Rt inguinal nodes, Rt axillo-inguinal anastomosis, then Rt UE working from proximal to distal  (lateral upper arm, inner to outer and lateral again; antecubital fossa, ant/post forearm and dorsal wrist/hand and fingers) and retracing all steps back to anastomosis and lymph nodes. P/ROM to Rt shoulder into flex, abd and D2 during MLD with scapular  depression by therapist STM to palpable tightness in Rt axilla and at chest wall  06/17/24: Self Care Pt brought in her new Alphonzo Cools and assessed fit of this. This fits pt very well and she reports this feeling comfortable. Adjusted straps so she could tell what's a comfortable amount of compression.  Therapeutic Exercises Pulleys into flex and abd x 2 mins each returning therapist demo and tactile cues for correct technique Roll yellow ball up wall x 10 into flex and Rt abd each returning therapist demo Manual Therapy MLD to Rt UE: Short neck, superficial and deep abdominals, Lt axillary and pectoral nodes, anterior intact thorax sequence, anterior  inter-axillary anastomosis, Rt inguinal nodes, Rt axillo-inguinal anastomosis, then Rt UE working from proximal to distal  (lateral upper arm, inner to outer and lateral again; antecubital fossa, ant/post forearm and dorsal wrist/hand and fingers) and retracing all steps back to anastomosis and lymph nodes. P/ROM to Rt shoulder into flex, abd and D2 during MLD with scapular depression by therapist STM to palpable tightness in Rt axilla and at chest wall  06/12/24: Manual Therapy Pt conts with excellent circumferential reductions since start of care. Removed pts bandages MLD to Rt UE: Short neck, superficial and deep abdominals, Lt axillary and pectoral nodes, anterior intact thorax sequence, anterior inter-axillary anastomosis, Rt inguinal nodes, Rt axillo-inguinal anastomosis, then Rt UE working from proximal to distal  (lateral upper arm, inner to outer and lateral again; antecubital fossa, ant/post forearm and dorsal wrist/hand and fingers) and retracing all steps back to anastomosis and lymph nodes. P/ROM to Rt shoulder into flex, abd and D2 during MLD with scapular depression by therapist STM to palpable tightness in Rt axilla and at chest wall Compression Bandaging to Rt UE: Cocoa butter, TG soft, artiflex to elbow, 1/4 gray foam to lower and upper arm fixated with Idealbinde, Molelast to fingers 1-4, artiflex to hand, then short stretch compression bandages as follows: 1- 6 cm to hand/wrist with 1/2 gray foam to dorsal hand/wrist, 1- 10 cm spiral with X at elbow, 1- 12 cm herring bone fashion from wrist to upper arm, then last 12 cm sprial from wrist to axilla and folded. TG soft over top of bandage.  06/10/24: Manual Therapy Removed bandages   MLD to Rt UE: Short neck, superficial and deep abdominals, Lt axillary and pectoral nodes, anterior intact thorax sequence, anterior inter-axillary anastomosis, Rt inguinal nodes, Rt axillo-inguinal anastomosis, then Rt UE working from proximal to  distal  (lateral upper arm, inner to outer and lateral again; antecubital fossa, ant/post forearm and dorsal wrist/hand and fingers) and retracing all steps back to anastomosis and lymph nodes. P/ROM to Rt shoulder into flex, abd and D2 during MLD STM to palpable tightness in Rt axilla and at chest wall Compression Bandaging to Rt UE: Cocoa butter, TG soft, artiflex to elbow, 1/4 gray foam to lower and upper arm fixated with Idealbinde, Molelast to fingers 1-4, artiflex to hand, then short stretch compression bandages as follows: 1- 6 cm to hand/wrist with 1/2 gray foam to dorsal hand/wrist, 1- 10 cm spiral with X at elbow, 1- 12 cm herring bone fashion from wrist to upper arm, then last 12 cm sprial from wrist to axilla and folded. TG soft over top of bandage.      PATIENT EDUCATION:  Education details: per today's note Person educated: Patient Education method: Explanation Education comprehension: verbalized understanding  HOME EXERCISE PROGRAM:   ASSESSMENT:  CLINICAL IMPRESSION: Re measured pts circumference to assess efficacy  of velcro garment. Some increase noted at forearm but pt reports earlier today her arm was much smaller then she worked outside for a bit so this may account for the temporary increase. Will check this again Friday and determine whether to bandage pt or have her cont wear of velcro over the weekend for maximal reductions until she gets measured next week for flat knit.   OBJECTIVE IMPAIRMENTS: decreased knowledge of condition, decreased knowledge of use of DME, decreased mobility, decreased ROM, increased edema, and increased fascial restrictions.   ACTIVITY LIMITATIONS: carrying and lifting  PARTICIPATION LIMITATIONS: meal prep, cleaning, community activity, and occupation  PERSONAL FACTORS: Time since onset of injury/illness/exacerbation and 1-2 comorbidities: complete ALND, radiation hx are also affecting patient's functional outcome.   REHAB  POTENTIAL: Excellent  CLINICAL DECISION MAKING: Stable/uncomplicated  EVALUATION COMPLEXITY: Low  GOALS: Goals reviewed with patient? Yes  SHORT TERM GOALS: Target date: 06/28/24  Pt will be ind with self compression bandaging if able Baseline: Goal status: INITIAL  2.  Pt will be ind with self MLD for the Rt UE Baseline:  Goal status: INITIAL  3.  Pt will be educated on cellulitis and lymphedema risk reduction  Baseline:  Goal status: INITIAL   LONG TERM GOALS: Target date: 07/30/24  Pt will be measured for or obtain correct flat knit garments for long term maintenance  Baseline:  Goal status: INITIAL  2.  Pt will be ind with self MLD or pursue a new pump for long term maintenance Baseline:  Goal status: INITIAL    PLAN:  PT FREQUENCY: 2-3x per week   PT DURATION: 10 weeks  PLANNED INTERVENTIONS: 97164- PT Re-evaluation, 97110-Therapeutic exercises, 97530- Therapeutic activity, 97112- Neuromuscular re-education, 97535- Self Care, 02859- Manual therapy, Patient/Family education, Balance training, Joint mobilization, Therapeutic exercises, Therapeutic activity, Neuromuscular re-education, Gait training, and Self Care  PLAN FOR NEXT SESSION: Remeasure circumference to determine velcro vs compression bandaging over th weekend. Pt will be measured for flat knit garments on 7/22 when [place] from [place] is here; Cont Rt UE CDT, cont Instructing pt in self bandaging; include some stretching as able; instruct husband when he can come. (Waiting on pump for now)  Aden Berwyn Caldron, PTA 06/19/2024, 4:16 PM

## 2024-06-20 NOTE — Addendum Note (Signed)
 Addended by: LARUE SADDIE SAUNDERS on: 06/20/2024 10:51 AM   Modules accepted: Orders

## 2024-06-21 ENCOUNTER — Encounter: Payer: Self-pay | Admitting: Rehabilitation

## 2024-06-21 ENCOUNTER — Ambulatory Visit: Payer: Self-pay | Admitting: Rehabilitation

## 2024-06-21 DIAGNOSIS — I972 Postmastectomy lymphedema syndrome: Secondary | ICD-10-CM

## 2024-06-21 DIAGNOSIS — M25611 Stiffness of right shoulder, not elsewhere classified: Secondary | ICD-10-CM

## 2024-06-21 NOTE — Therapy (Signed)
 OUTPATIENT PHYSICAL THERAPY  UPPER EXTREMITY ONCOLOGY TREATMENT  Patient Name: Sue Wong MRN: [id number] DOB:[birth year], 58 y.o., female Today's Date: 06/21/2024  END OF SESSION:  PT End of Session - 06/21/24 1155     Visit Number 12    Number of Visits 25    Date for PT Re-Evaluation 07/30/24    Authorization Type [employer] Approved 13 visits-05/20/2024-08/18/2024    Authorization - Visit Number 12    Authorization - Number of Visits 13    PT Start Time 1100    PT Stop Time 1155    PT Time Calculation (min) 55 min    Activity Tolerance Patient tolerated treatment well    Behavior During Therapy WFL for tasks assessed/performed            Past Medical History:  Diagnosis Date   Arthritis    Breast cancer (HCC)    GERD (gastroesophageal reflux disease)    Psoriasis    [place] spotted fever    Past Surgical History:  Procedure Laterality Date   BREAST SURGERY Bilateral 1/9 and 12/27/16   Patient Active Problem List   Diagnosis Date Noted   Sepsis (HCC) 04/26/2023   Cellulitis 06/04/2020   Fever 06/04/2020   Lymphedema 06/04/2020    REFERRING PROVIDER: Ophelia Sage III, MD  REFERRING DIAG: I89.0  THERAPY DIAG:  Postmastectomy lymphedema syndrome  Stiffness of right shoulder, not elsewhere classified  ONSET DATE: 2018  Rationale for Evaluation and Treatment: Rehabilitation  SUBJECTIVE:                                                                                                                                                                                           SUBJECTIVE STATEMENT:  I'm liking the new garment. It's nice having a bit more flexibility with being able to take it off for a short time.   PERTINENT HISTORY: Hx of Rt breast cancer metastasized to LN with bil mastectomy 12/13/2016 and then complete lymphadenectomy 12/27/16,  radiation and chemotherapy, hx of recurrent cellulitis Rt UE, psoriatic arthritis  PAIN:  Are you having  pain? No not right now.     PRECAUTIONS: recurrent Rt arm cellulitis   RED FLAGS: None   WEIGHT BEARING RESTRICTIONS: No  FALLS:  Has patient fallen in last 6 months? No  LIVING ENVIRONMENT: Lives with: lives with their family  OCCUPATION: Not working now.  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    LEISURE:   HAND DOMINANCE: right   PRIOR LEVEL OF FUNCTION: Independent  PATIENT GOALS: try to treat this arm   OBJECTIVE: Note: Objective measures  were completed at Evaluation unless otherwise noted.  COGNITION: Overall cognitive status: Within functional limits for tasks assessed   PALPATION: +1 pitting Rt dorsal hand and forearm, no pitting in the upper arm, tightness in the Rt axilla and pectoralis region  OBSERVATIONS / OTHER ASSESSMENTS: Rt arm larger with adipose appearance. Post radiation changes to Rt axilla   UPPER EXTREMITY AROM/PROM:  A/PROM RIGHT   eval   Shoulder extension   Shoulder flexion 145  Shoulder abduction   Shoulder internal rotation   Shoulder external rotation     (Blank rows = not tested)  A/PROM LEFT   eval  Shoulder extension   Shoulder flexion 160  Shoulder abduction   Shoulder internal rotation   Shoulder external rotation     (Blank rows = not tested)  LYMPHEDEMA ASSESSMENTS:   LANDMARK RIGHT  Previous visits with OT 05/20/24 05/29/24 06/05/24 06/12/24 06/17/24 06/19/24 06/21/24  At axilla           15 cm proximal to olecranon process 38 39.7 36.8 38.3 36.7 36.4 36.7   10 cm proximal to olecranon process 37.5 40.5 38.3 40.2 37.6 37.3 38.1 38.5  Olecranon process 33 37.5 34.9 35.3 34.4 34.9 34.4 34.4  15 cm proximal to ulnar styloid process 33.5 37.9 35.9 36.2 35.7 35.2 35.6   10 cm proximal to ulnar styloid process 30 34.1 33.5 33.5 33.1 32.9 33.5 33.5  Just proximal to ulnar styloid process (at wrist crease) 21.8 21.2 20.8 21.6 21.8 20.4 20.4 21.1  Across hand at thumb web space 20 21.5 21.2 21.5 20.4 20.8  20.7 20.9  At base of 2nd digit  7.5 6.8 7.4 7.2 6.8 6.7 7.0  (Blank rows = not tested)  LANDMARK LEFT  eval  At axilla    15 cm proximal to olecranon process 38.4  10 cm proximal to olecranon process 37.3  Olecranon process 32  15 cm proximal to ulnar styloid process 30.2  10 cm proximal to ulnar styloid process 27.3  Just proximal to ulnar styloid process 20.2  Across hand at thumb web space 20.1  At base of 2nd digit 7.3  (Blank rows = not tested)  Lt: Rt: Difference of:  8.52ml length                                                                                                                             TREATMENT DATE:  06/21/24 Pulleys into flexion and abduction x each Manual Therapy MLD to Rt UE: Short neck, superficial and deep abdominals, Lt axillary and pectoral nodes, anterior intact thorax sequence, anterior inter-axillary anastomosis, Rt inguinal nodes, Rt axillo-inguinal anastomosis, then Rt UE working from proximal to distal  (lateral upper arm, inner to outer and lateral again; antecubital fossa, ant/post forearm and dorsal wrist/hand and fingers) and retracing all steps back to anastomosis and lymph nodes. P/ROM to Rt shoulder into flex, abd and D2 during MLD with scapular depression  by therapist STM to forearm at fibrosis   06/19/24: Manual Therapy MLD to Rt UE: Short neck, superficial and deep abdominals, Lt axillary and pectoral nodes, anterior intact thorax sequence, anterior inter-axillary anastomosis, Rt inguinal nodes, Rt axillo-inguinal anastomosis, then Rt UE working from proximal to distal  (lateral upper arm, inner to outer and lateral again; antecubital fossa, ant/post forearm and dorsal wrist/hand and fingers) and retracing all steps back to anastomosis and lymph nodes. P/ROM to Rt shoulder into flex, abd and D2 during MLD with scapular depression by therapist STM to palpable tightness in Rt axilla and at chest  wall  06/17/24: Self Care Pt brought in her new Alphonzo Cools and assessed fit of this. This fits pt very well and she reports this feeling comfortable. Adjusted straps so she could tell what's a comfortable amount of compression.  Therapeutic Exercises Pulleys into flex and abd x 2 mins each returning therapist demo and tactile cues for correct technique Roll yellow ball up wall x 10 into flex and Rt abd each returning therapist demo Manual Therapy MLD to Rt UE: Short neck, superficial and deep abdominals, Lt axillary and pectoral nodes, anterior intact thorax sequence, anterior inter-axillary anastomosis, Rt inguinal nodes, Rt axillo-inguinal anastomosis, then Rt UE working from proximal to distal  (lateral upper arm, inner to outer and lateral again; antecubital fossa, ant/post forearm and dorsal wrist/hand and fingers) and retracing all steps back to anastomosis and lymph nodes. P/ROM to Rt shoulder into flex, abd and D2 during MLD with scapular depression by therapist STM to palpable tightness in Rt axilla and at chest wall  06/12/24: Manual Therapy Pt conts with excellent circumferential reductions since start of care. Removed pts bandages MLD to Rt UE: Short neck, superficial and deep abdominals, Lt axillary and pectoral nodes, anterior intact thorax sequence, anterior inter-axillary anastomosis, Rt inguinal nodes, Rt axillo-inguinal anastomosis, then Rt UE working from proximal to distal  (lateral upper arm, inner to outer and lateral again; antecubital fossa, ant/post forearm and dorsal wrist/hand and fingers) and retracing all steps back to anastomosis and lymph nodes. P/ROM to Rt shoulder into flex, abd and D2 during MLD with scapular depression by therapist STM to palpable tightness in Rt axilla and at chest wall Compression Bandaging to Rt UE: Cocoa butter, TG soft, artiflex to elbow, 1/4 gray foam to lower and upper arm fixated with Idealbinde, Molelast to fingers 1-4, artiflex to  hand, then short stretch compression bandages as follows: 1- 6 cm to hand/wrist with 1/2 gray foam to dorsal hand/wrist, 1- 10 cm spiral with X at elbow, 1- 12 cm herring bone fashion from wrist to upper arm, then last 12 cm sprial from wrist to axilla and folded. TG soft over top of bandage.  06/10/24: Manual Therapy Removed bandages   MLD to Rt UE: Short neck, superficial and deep abdominals, Lt axillary and pectoral nodes, anterior intact thorax sequence, anterior inter-axillary anastomosis, Rt inguinal nodes, Rt axillo-inguinal anastomosis, then Rt UE working from proximal to distal  (lateral upper arm, inner to outer and lateral again; antecubital fossa, ant/post forearm and dorsal wrist/hand and fingers) and retracing all steps back to anastomosis and lymph nodes. P/ROM to Rt shoulder into flex, abd and D2 during MLD STM to palpable tightness in Rt axilla and at chest wall Compression Bandaging to Rt UE: Cocoa butter, TG soft, artiflex to elbow, 1/4 gray foam to lower and upper arm fixated with Idealbinde, Molelast to fingers 1-4, artiflex to hand, then short stretch compression bandages  as follows: 1- 6 cm to hand/wrist with 1/2 gray foam to dorsal hand/wrist, 1- 10 cm spiral with X at elbow, 1- 12 cm herring bone fashion from wrist to upper arm, then last 12 cm sprial from wrist to axilla and folded. TG soft over top of bandage.      PATIENT EDUCATION:  Education details: per today's note Person educated: Patient Education method: Explanation Education comprehension: verbalized understanding  HOME EXERCISE PROGRAM:   ASSESSMENT:  CLINICAL IMPRESSION: Pt is overall maintaining in the tribute wrap.  Reminded her to get the edges closed as much as possible.  Pt has one more approved visit by insurance but I was not sure if she wants to do more or will feel fine after getting measured for her garments so we can do this next visit.    OBJECTIVE IMPAIRMENTS: decreased knowledge of  condition, decreased knowledge of use of DME, decreased mobility, decreased ROM, increased edema, and increased fascial restrictions.   ACTIVITY LIMITATIONS: carrying and lifting  PARTICIPATION LIMITATIONS: meal prep, cleaning, community activity, and occupation  PERSONAL FACTORS: Time since onset of injury/illness/exacerbation and 1-2 comorbidities: complete ALND, radiation hx are also affecting patient's functional outcome.   REHAB POTENTIAL: Excellent  CLINICAL DECISION MAKING: Stable/uncomplicated  EVALUATION COMPLEXITY: Low  GOALS: Goals reviewed with patient? Yes  SHORT TERM GOALS: Target date: 06/28/24  Pt will be ind with self compression bandaging if able Baseline: Goal status: MET  2.  Pt will be ind with self MLD for the Rt UE Baseline:  Goal status: INITIAL  3.  Pt will be educated on cellulitis and lymphedema risk reduction  Baseline:  Goal status: MET   LONG TERM GOALS: Target date: 07/30/24  Pt will be measured for or obtain correct flat knit garments for long term maintenance  Baseline:  Goal status: INITIAL  2.  Pt will be ind with self MLD or pursue a new pump for long term maintenance Baseline:  Goal status: INITIAL    PLAN:  PT FREQUENCY: 2-3x per week   PT DURATION: 10 weeks  PLANNED INTERVENTIONS: 97164- PT Re-evaluation, 97110-Therapeutic exercises, 97530- Therapeutic activity, 97112- Neuromuscular re-education, 97535- Self Care, [id number]- Manual therapy, Patient/Family education, Balance training, Joint mobilization, Therapeutic exercises, Therapeutic activity, Neuromuscular re-education, Gait training, and Self Care  PLAN FOR NEXT SESSION: *See if pt would like to extend insurance visits and new auth needed if she does.  Review HEP and self MLD if getting ready for D/C.  Pt will be measured for flat knit garments on 7/22 when [place] from [place] is here; remind pt she can get a pump if she ever would like to.  (Waiting on pump for now)  Kaniya Trueheart,  Johnna Bollier R, PT 06/21/2024, 11:58 AM

## 2024-06-26 ENCOUNTER — Ambulatory Visit: Payer: Self-pay | Admitting: Rehabilitation

## 2024-06-26 ENCOUNTER — Encounter: Payer: Self-pay | Admitting: Rehabilitation

## 2024-06-26 DIAGNOSIS — I972 Postmastectomy lymphedema syndrome: Secondary | ICD-10-CM | POA: Diagnosis not present

## 2024-06-26 DIAGNOSIS — M25611 Stiffness of right shoulder, not elsewhere classified: Secondary | ICD-10-CM

## 2024-06-26 NOTE — Patient Instructions (Addendum)
 1.) Do 5-10 circles at the collarbones   Deep Effective Breath   2.) Standing, sitting, or laying down, place both hands on the belly. Take a deep breath IN, expanding the belly; then breath OUT, contracting the belly.  Copyright  VHI. All rights reserved.  Axilla to Axilla - Sweep   3.) On both sides make 5 circles in the armpis  4.) then pump _5__ times from involved armpit across chest to uninvolved armpit, making a pathway. (Right to Left) Do _1__ time per day.  Copyright  VHI. All rights reserved.  Axilla to Inguinal Nodes - Sweep   5.) On involved side, make 5 circles at groin at panty line  6.) then pump _5__ times from armpit along side of trunk to outer hip, making your other pathway.  Copyright  VHI. All rights reserved.  Arm Posterior: Elbow to Shoulder - Sweep   7.) Pump _5__ times from back of elbow to top of shoulder.   8.) Then inner to outer upper arm _5_ times, then outer arm again _5_ times. Then back to the pathways _2-3_ times.  ARM: [place] Wrist to Elbow - Sweep   9.) Pump or stationary circles _5__ times from wrist to elbow making sure to do both sides of the forearm.     10. Pump or stationary circles _5__ times on back of hand including knuckle spaces and individual fingers if needed working up towards the wrist,   11.) then retrace all your steps working back up the forearm, doing both sides; upper outer arm and back to your pathways _2-3_ times each. Then do 5 circles again at uninvolved armpit and involved groin where you started! Good job!! Do __1_ time per day.  Copyright  VHI. All rights reserved.

## 2024-06-26 NOTE — Therapy (Addendum)
 OUTPATIENT PHYSICAL THERAPY  UPPER EXTREMITY ONCOLOGY TREATMENT  Patient Name: Sue Wong MRN: [id number] DOB:21-Sep-1966, 58 y.o., female Today's Date: 06/26/2024  END OF SESSION:  PT End of Session - 06/26/24 1553     Visit Number 13    Number of Visits 25    Date for PT Re-Evaluation 07/30/24    Authorization Type [employer] Approved 13 visits-05/20/2024-08/18/2024    Authorization - Visit Number 13    Authorization - Number of Visits 13    PT Start Time 1500    PT Stop Time 1553    PT Time Calculation (min) 53 min    Activity Tolerance Patient tolerated treatment well    Behavior During Therapy WFL for tasks assessed/performed             Past Medical History:  Diagnosis Date   Arthritis    Breast cancer (HCC)    GERD (gastroesophageal reflux disease)    Psoriasis    [place] spotted fever    Past Surgical History:  Procedure Laterality Date   BREAST SURGERY Bilateral 1/9 and 12/27/16   Patient Active Problem List   Diagnosis Date Noted   Sepsis (HCC) 04/26/2023   Cellulitis 06/04/2020   Fever 06/04/2020   Lymphedema 06/04/2020    REFERRING PROVIDER: Ophelia Sage III, MD  REFERRING DIAG: I89.0  THERAPY DIAG:  Postmastectomy lymphedema syndrome  Stiffness of right shoulder, not elsewhere classified  ONSET DATE: 2018  Rationale for Evaluation and Treatment: Rehabilitation  SUBJECTIVE:                                                                                                                                                                                           SUBJECTIVE STATEMENT:  I got measured yesterday.  It doesn't seem to matter if my hand is wrapped or not and just in the tribute garment  PERTINENT HISTORY: Hx of Rt breast cancer metastasized to LN with bil mastectomy 12/13/2016 and then complete lymphadenectomy 12/27/16,  radiation and chemotherapy, hx of recurrent cellulitis Rt UE, psoriatic arthritis  PAIN:  Are you having  pain? No not right now.     PRECAUTIONS: recurrent Rt arm cellulitis   RED FLAGS: None   WEIGHT BEARING RESTRICTIONS: No  FALLS:  Has patient fallen in last 6 months? No  LIVING ENVIRONMENT: Lives with: lives with their family  OCCUPATION: Not working now.  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    LEISURE:   HAND DOMINANCE: right   PRIOR LEVEL OF FUNCTION: Independent  PATIENT GOALS: try to treat this arm   OBJECTIVE: Note: Objective measures  were completed at Evaluation unless otherwise noted.  COGNITION: Overall cognitive status: Within functional limits for tasks assessed   PALPATION: +1 pitting Rt dorsal hand and forearm, no pitting in the upper arm, tightness in the Rt axilla and pectoralis region  OBSERVATIONS / OTHER ASSESSMENTS: Rt arm larger with adipose appearance. Post radiation changes to Rt axilla   UPPER EXTREMITY AROM/PROM:  A/PROM RIGHT   eval   Shoulder extension   Shoulder flexion 145  Shoulder abduction   Shoulder internal rotation   Shoulder external rotation     (Blank rows = not tested)  A/PROM LEFT   eval  Shoulder extension   Shoulder flexion 160  Shoulder abduction   Shoulder internal rotation   Shoulder external rotation     (Blank rows = not tested)  LYMPHEDEMA ASSESSMENTS:   LANDMARK RIGHT  Previous visits with OT 05/20/24 05/29/24 06/05/24 06/12/24 06/17/24 06/19/24 06/21/24 06/26/24  At axilla            15 cm proximal to olecranon process 38 39.7 36.8 38.3 36.7 36.4 36.7  36.7  10 cm proximal to olecranon process 37.5 40.5 38.3 40.2 37.6 37.3 38.1 38.5 39  Olecranon process 33 37.5 34.9 35.3 34.4 34.9 34.4 34.4 35  15 cm proximal to ulnar styloid process 33.5 37.9 35.9 36.2 35.7 35.2 35.6  36.2  10 cm proximal to ulnar styloid process 30 34.1 33.5 33.5 33.1 32.9 33.5 33.5 34.2  Just proximal to ulnar styloid process (at wrist crease) 21.8 21.2 20.8 21.6 21.8 20.4 20.4 21.1 22  Across hand at thumb web  space 20 21.5 21.2 21.5 20.4 20.8 20.7 20.9 21  At base of 2nd digit  7.5 6.8 7.4 7.2 6.8 6.7 7.0 7.0  (Blank rows = not tested)  LANDMARK LEFT  eval  At axilla    15 cm proximal to olecranon process 38.4  10 cm proximal to olecranon process 37.3  Olecranon process 32  15 cm proximal to ulnar styloid process 30.2  10 cm proximal to ulnar styloid process 27.3  Just proximal to ulnar styloid process 20.2  Across hand at thumb web space 20.1  At base of 2nd digit 7.3  (Blank rows = not tested)  Lt: Rt: on eval     3649 on 06/26/24 Difference of:  8.44ml length                                                                                                                             TREATMENT DATE:  06/26/24 Manual Therapy STM / deeper techniques to forearm with cocoa butter to decrease fibrosis prior to MLD MLD to Rt UE: Short neck, superficial and deep abdominals, bil axillary and pectoral nodes, anterior inter-axillary anastomosis, Rt inguinal nodes, Rt axillo-inguinal anastomosis, then Rt UE working from proximal to distal  (lateral upper arm, inner to outer and lateral again; antecubital fossa, ant/post forearm and dorsal wrist/hand and fingers) and retracing  all steps back to anastomosis and lymph nodes. Focused on reviewing self MLD and gave pt handout.  She had learned previously but wanted to have our version.   P/ROM to Rt shoulder into flex, abd and D2 during MLD with scapular depression by therapist Discussed KT to hand with tribute, pt has experience with this and reports it used to help.   06/21/24 Pulleys into flexion and abduction x each Manual Therapy MLD to Rt UE: Short neck, superficial and deep abdominals, Lt axillary and pectoral nodes, anterior intact thorax sequence, anterior inter-axillary anastomosis, Rt inguinal nodes, Rt axillo-inguinal anastomosis, then Rt UE working from proximal to distal  (lateral upper arm, inner to outer and lateral  again; antecubital fossa, ant/post forearm and dorsal wrist/hand and fingers) and retracing all steps back to anastomosis and lymph nodes. P/ROM to Rt shoulder into flex, abd and D2 during MLD with scapular depression by therapist STM to forearm at fibrosis   06/19/24: Manual Therapy MLD to Rt UE: Short neck, superficial and deep abdominals, Lt axillary and pectoral nodes, anterior intact thorax sequence, anterior inter-axillary anastomosis, Rt inguinal nodes, Rt axillo-inguinal anastomosis, then Rt UE working from proximal to distal  (lateral upper arm, inner to outer and lateral again; antecubital fossa, ant/post forearm and dorsal wrist/hand and fingers) and retracing all steps back to anastomosis and lymph nodes. P/ROM to Rt shoulder into flex, abd and D2 during MLD with scapular depression by therapist STM to palpable tightness in Rt axilla and at chest wall   PATIENT EDUCATION:  Education details: per today's note Person educated: Patient Education method: Explanation Education comprehension: verbalized understanding  HOME EXERCISE PROGRAM:   ASSESSMENT:  CLINICAL IMPRESSION: Pt is overall maintaining in the tribute wrap with slight increase today.  She has now been measured for garments and would benefit from continued CDT and PT visits to make sure her new garments fit well and are maintaining her arm as well as to improve fibrosis.   Pt will benefit from a custom class 2 550 medi garment due to stage 2 UE lymphedema and skin creases that would lead to binding due to shaping.  She will also need a glove for hand lymphedema class 2 550.    OBJECTIVE IMPAIRMENTS: decreased knowledge of condition, decreased knowledge of use of DME, decreased mobility, decreased ROM, increased edema, and increased fascial restrictions.   ACTIVITY LIMITATIONS: carrying and lifting  PARTICIPATION LIMITATIONS: meal prep, cleaning, community activity, and occupation  PERSONAL FACTORS: Time since onset  of injury/illness/exacerbation and 1-2 comorbidities: complete ALND, radiation hx are also affecting patient's functional outcome.   REHAB POTENTIAL: Excellent  CLINICAL DECISION MAKING: Stable/uncomplicated  EVALUATION COMPLEXITY: Low  GOALS: Goals reviewed with patient? Yes  SHORT TERM GOALS: Target date: 06/28/24  Pt will be ind with self compression bandaging if able Baseline: Goal status: MET  2.  Pt will be ind with self MLD for the Rt UE Baseline:  Goal status: INITIAL  3.  Pt will be educated on cellulitis and lymphedema risk reduction  Baseline:  Goal status: MET   LONG TERM GOALS: Target date: 07/30/24  Pt will be measured for or obtain correct flat knit garments for long term maintenance  Baseline:  Goal status: PARTIALLY MET - has been measured   2.  Pt will be ind with self MLD or pursue a new pump for long term maintenance Baseline:  Goal status: INITIAL    PLAN:  PT FREQUENCY: 2-3x per week   PT DURATION:  4 weeks   PLANNED INTERVENTIONS: 97164- PT Re-evaluation, 97110-Therapeutic exercises, 97530- Therapeutic activity, 97112- Neuromuscular re-education, [phone]- Self Care, 02859- Manual therapy, Patient/Family education, Balance training, Joint mobilization, Therapeutic exercises, Therapeutic activity, Neuromuscular re-education, Gait training, and Self Care  PLAN FOR NEXT SESSION:  have pt perform self MLD to review and meet goals.  (Waiting on pump for now)  Ellizabeth Dacruz R, PT 06/26/2024, 3:54 PM

## 2024-06-28 ENCOUNTER — Ambulatory Visit: Payer: Self-pay

## 2024-07-01 ENCOUNTER — Ambulatory Visit: Payer: Self-pay

## 2024-07-01 DIAGNOSIS — I972 Postmastectomy lymphedema syndrome: Secondary | ICD-10-CM

## 2024-07-01 DIAGNOSIS — M25611 Stiffness of right shoulder, not elsewhere classified: Secondary | ICD-10-CM

## 2024-07-01 NOTE — Therapy (Signed)
 OUTPATIENT PHYSICAL THERAPY  UPPER EXTREMITY ONCOLOGY TREATMENT  Patient Name: Sue Wong MRN: [id number] DOB:[birth year], 58 y.o., female Today's Date: 07/01/2024  END OF SESSION:  PT End of Session - 07/01/24 1407     Visit Number 14    Number of Visits 25    Date for PT Re-Evaluation 07/30/24    Authorization Type [employer] Approved 13 visits-05/20/2024-08/18/2024;    Authorization - Visit Number 14    Authorization - Number of Visits 18    PT Start Time 1403    PT Stop Time 1457    PT Time Calculation (min) 54 min    Activity Tolerance Patient tolerated treatment well    Behavior During Therapy WFL for tasks assessed/performed             Past Medical History:  Diagnosis Date   Arthritis    Breast cancer (HCC)    GERD (gastroesophageal reflux disease)    Psoriasis    [place] spotted fever    Past Surgical History:  Procedure Laterality Date   BREAST SURGERY Bilateral 1/9 and 12/27/16   Patient Active Problem List   Diagnosis Date Noted   Sepsis (HCC) 04/26/2023   Cellulitis 06/04/2020   Fever 06/04/2020   Lymphedema 06/04/2020    REFERRING PROVIDER: Ophelia Sage III, MD  REFERRING DIAG: I89.0  THERAPY DIAG:  Postmastectomy lymphedema syndrome  Stiffness of right shoulder, not elsewhere classified  ONSET DATE: 2018  Rationale for Evaluation and Treatment: Rehabilitation  SUBJECTIVE:                                                                                                                                                                                           SUBJECTIVE STATEMENT:  I got measured yesterday.  It doesn't seem to matter if my hand is wrapped or not and just in the tribute garment  PERTINENT HISTORY: Hx of Rt breast cancer metastasized to LN with bil mastectomy 12/13/2016 and then complete lymphadenectomy 12/27/16,  radiation and chemotherapy, hx of recurrent cellulitis Rt UE, psoriatic arthritis  PAIN:  Are you having  pain? No not right now.     PRECAUTIONS: recurrent Rt arm cellulitis   RED FLAGS: None   WEIGHT BEARING RESTRICTIONS: No  FALLS:  Has patient fallen in last 6 months? No  LIVING ENVIRONMENT: Lives with: lives with their family  OCCUPATION: Not working now.  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    LEISURE:   HAND DOMINANCE: right   PRIOR LEVEL OF FUNCTION: Independent  PATIENT GOALS: try to treat this arm   OBJECTIVE: Note: Objective measures  were completed at Evaluation unless otherwise noted.  COGNITION: Overall cognitive status: Within functional limits for tasks assessed   PALPATION: +1 pitting Rt dorsal hand and forearm, no pitting in the upper arm, tightness in the Rt axilla and pectoralis region  OBSERVATIONS / OTHER ASSESSMENTS: Rt arm larger with adipose appearance. Post radiation changes to Rt axilla   UPPER EXTREMITY AROM/PROM:  A/PROM RIGHT   eval   Shoulder extension   Shoulder flexion 145  Shoulder abduction   Shoulder internal rotation   Shoulder external rotation     (Blank rows = not tested)  A/PROM LEFT   eval  Shoulder extension   Shoulder flexion 160  Shoulder abduction   Shoulder internal rotation   Shoulder external rotation     (Blank rows = not tested)  LYMPHEDEMA ASSESSMENTS:   LANDMARK RIGHT  Previous visits with OT 05/20/24 05/29/24 06/05/24 06/12/24 06/17/24 06/19/24 06/21/24 06/26/24  At axilla            15 cm proximal to olecranon process 38 39.7 36.8 38.3 36.7 36.4 36.7  36.7  10 cm proximal to olecranon process 37.5 40.5 38.3 40.2 37.6 37.3 38.1 38.5 39  Olecranon process 33 37.5 34.9 35.3 34.4 34.9 34.4 34.4 35  15 cm proximal to ulnar styloid process 33.5 37.9 35.9 36.2 35.7 35.2 35.6  36.2  10 cm proximal to ulnar styloid process 30 34.1 33.5 33.5 33.1 32.9 33.5 33.5 34.2  Just proximal to ulnar styloid process (at wrist crease) 21.8 21.2 20.8 21.6 21.8 20.4 20.4 21.1 22  Across hand at thumb web  space 20 21.5 21.2 21.5 20.4 20.8 20.7 20.9 21  At base of 2nd digit  7.5 6.8 7.4 7.2 6.8 6.7 7.0 7.0  (Blank rows = not tested)  LANDMARK LEFT  eval  At axilla    15 cm proximal to olecranon process 38.4  10 cm proximal to olecranon process 37.3  Olecranon process 32  15 cm proximal to ulnar styloid process 30.2  10 cm proximal to ulnar styloid process 27.3  Just proximal to ulnar styloid process 20.2  Across hand at thumb web space 20.1  At base of 2nd digit 7.3  (Blank rows = not tested)  Lt: Rt: on eval     3649 on 06/26/24 Difference of:  8.104ml length                                                                                                                             TREATMENT DATE:  07/01/24: Therapeutic Exercises Pulleys into flex x 4 mins, abd x 2 mins Roll yellow ball up wall into flex and Rt UE abd x 10 each, pt started feeling shoulder cramp at end of reps Therapeutic Activities Supine over half foam roll for following: Bil UE horz abd, bil UE scaption into a V and then bil UE abd snow angels 5 sec holds, x 10 each returning therapist demo  Manual Therapy MLD to Rt UE: Short neck, superficial and deep abdominals, Lt axillary and pectoral nodes, anterior intact thorax sequence, anterior inter-axillary anastomosis, Rt inguinal nodes, Rt axillo-inguinal anastomosis, then Rt UE working from proximal to distal  (lateral upper arm, inner to outer and lateral again; antecubital fossa, ant/post forearm and dorsal wrist/hand and fingers) and retracing all steps back to anastomosis and lymph nodes.  P/ROM to Rt shoulder into flex, abd and D2 during MLD with scapular depression by therapist  06/26/24 Manual Therapy STM / deeper techniques to forearm with cocoa butter to decrease fibrosis prior to MLD MLD to Rt UE: Short neck, superficial and deep abdominals, bil axillary and pectoral nodes, anterior inter-axillary anastomosis, Rt inguinal nodes, Rt  axillo-inguinal anastomosis, then Rt UE working from proximal to distal  (lateral upper arm, inner to outer and lateral again; antecubital fossa, ant/post forearm and dorsal wrist/hand and fingers) and retracing all steps back to anastomosis and lymph nodes. Focused on reviewing self MLD and gave pt handout.  She had learned previously but wanted to have our version.   P/ROM to Rt shoulder into flex, abd and D2 during MLD with scapular depression by therapist Discussed KT to hand with tribute, pt has experience with this and reports it used to help.   06/21/24 Pulleys into flexion and abduction x each Manual Therapy MLD to Rt UE: Short neck, superficial and deep abdominals, Lt axillary and pectoral nodes, anterior intact thorax sequence, anterior inter-axillary anastomosis, Rt inguinal nodes, Rt axillo-inguinal anastomosis, then Rt UE working from proximal to distal  (lateral upper arm, inner to outer and lateral again; antecubital fossa, ant/post forearm and dorsal wrist/hand and fingers) and retracing all steps back to anastomosis and lymph nodes. P/ROM to Rt shoulder into flex, abd and D2 during MLD with scapular depression by therapist STM to forearm at fibrosis      PATIENT EDUCATION:  Education details: per today's note Person educated: Patient Education method: Explanation Education comprehension: verbalized understanding  HOME EXERCISE PROGRAM:   ASSESSMENT:  CLINICAL IMPRESSION: Pt is continuing to maintain lymphedema with Alphonzo for now. Today progressed pt to include AA/ROM stretches and then continued with MLD and manual therapy working to decrease Rt upper quadrant tightness.     OBJECTIVE IMPAIRMENTS: decreased knowledge of condition, decreased knowledge of use of DME, decreased mobility, decreased ROM, increased edema, and increased fascial restrictions.   ACTIVITY LIMITATIONS: carrying and lifting  PARTICIPATION LIMITATIONS: meal prep, cleaning, community  activity, and occupation  PERSONAL FACTORS: Time since onset of injury/illness/exacerbation and 1-2 comorbidities: complete ALND, radiation hx are also affecting patient's functional outcome.   REHAB POTENTIAL: Excellent  CLINICAL DECISION MAKING: Stable/uncomplicated  EVALUATION COMPLEXITY: Low  GOALS: Goals reviewed with patient? Yes  SHORT TERM GOALS: Target date: 06/28/24  Pt will be ind with self compression bandaging if able Baseline: Goal status: MET  2.  Pt will be ind with self MLD for the Rt UE Baseline:  Goal status: INITIAL  3.  Pt will be educated on cellulitis and lymphedema risk reduction  Baseline:  Goal status: MET   LONG TERM GOALS: Target date: 07/30/24  Pt will be measured for or obtain correct flat knit garments for long term maintenance  Baseline:  Goal status: PARTIALLY MET - has been measured   2.  Pt will be ind with self MLD or pursue a new pump for long term maintenance Baseline:  Goal status: INITIAL    PLAN:  PT FREQUENCY: 2-3x per week  PT DURATION: 4 weeks   PLANNED INTERVENTIONS: 97164- PT Re-evaluation, 97110-Therapeutic exercises, 97530- Therapeutic activity, 97112- Neuromuscular re-education, 97535- Self Care, [id number]- Manual therapy, Patient/Family education, Balance training, Joint mobilization, Therapeutic exercises, Therapeutic activity, Neuromuscular re-education, Gait training, and Self Care  PLAN FOR NEXT SESSION:  Pt decreased freq to 1x/wk to stretch out last few visits; have pt perform self MLD to review and meet goals.  (Waiting on pump for now)  Aden Berwyn Caldron, PTA 07/01/2024, 3:02 PM

## 2024-07-03 ENCOUNTER — Encounter: Payer: Self-pay | Admitting: Rehabilitation

## 2024-07-08 ENCOUNTER — Encounter: Payer: Self-pay | Admitting: Rehabilitation

## 2024-07-10 ENCOUNTER — Ambulatory Visit: Payer: Self-pay

## 2024-07-17 ENCOUNTER — Ambulatory Visit: Payer: Self-pay | Attending: Internal Medicine

## 2024-07-17 DIAGNOSIS — I972 Postmastectomy lymphedema syndrome: Secondary | ICD-10-CM | POA: Insufficient documentation

## 2024-07-17 DIAGNOSIS — M25611 Stiffness of right shoulder, not elsewhere classified: Secondary | ICD-10-CM | POA: Insufficient documentation

## 2024-07-17 NOTE — Therapy (Signed)
 OUTPATIENT PHYSICAL THERAPY  UPPER EXTREMITY ONCOLOGY TREATMENT  Patient Name: Sue Wong MRN: [id number] DOB:01-04-1966, 58 y.o., female Today's Date: 07/17/2024  END OF SESSION:  PT End of Session - 07/17/24 1707     Visit Number 15    Number of Visits 25    Date for PT Re-Evaluation 07/30/24    Authorization Type [employer] Approved 13 visits-05/20/2024-08/18/2024;    Authorization - Visit Number 15    Authorization - Number of Visits 18    PT Start Time 1604    PT Stop Time 1701    PT Time Calculation (min) 57 min    Activity Tolerance Patient tolerated treatment well    Behavior During Therapy WFL for tasks assessed/performed             Past Medical History:  Diagnosis Date   Arthritis    Breast cancer (HCC)    GERD (gastroesophageal reflux disease)    Psoriasis    [place] spotted fever    Past Surgical History:  Procedure Laterality Date   BREAST SURGERY Bilateral 1/9 and 12/27/16   Patient Active Problem List   Diagnosis Date Noted   Sepsis (HCC) 04/26/2023   Cellulitis 06/04/2020   Fever 06/04/2020   Lymphedema 06/04/2020    REFERRING PROVIDER: Ophelia Sage III, MD  REFERRING DIAG: I89.0  THERAPY DIAG:  Postmastectomy lymphedema syndrome  Stiffness of right shoulder, not elsewhere classified  ONSET DATE: 2018  Rationale for Evaluation and Treatment: Rehabilitation  SUBJECTIVE:                                                                                                                                                                                           SUBJECTIVE STATEMENT:  I've been wearing the velcro compression garment during the day and it's seeming to manage my swelling. I need to figure out ordering my compression.   PERTINENT HISTORY: Hx of Rt breast cancer metastasized to LN with bil mastectomy 12/13/2016 and then complete lymphadenectomy 12/27/16,  radiation and chemotherapy, hx of recurrent cellulitis Rt UE, psoriatic  arthritis  PAIN:  Are you having pain? No not right now.     PRECAUTIONS: recurrent Rt arm cellulitis   RED FLAGS: None   WEIGHT BEARING RESTRICTIONS: No  FALLS:  Has patient fallen in last 6 months? No  LIVING ENVIRONMENT: Lives with: lives with their family  OCCUPATION: Not working now.  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    LEISURE:   HAND DOMINANCE: right   PRIOR LEVEL OF FUNCTION: Independent  PATIENT GOALS: try to treat this arm   OBJECTIVE:  Note: Objective measures were completed at Evaluation unless otherwise noted.  COGNITION: Overall cognitive status: Within functional limits for tasks assessed   PALPATION: +1 pitting Rt dorsal hand and forearm, no pitting in the upper arm, tightness in the Rt axilla and pectoralis region  OBSERVATIONS / OTHER ASSESSMENTS: Rt arm larger with adipose appearance. Post radiation changes to Rt axilla   UPPER EXTREMITY AROM/PROM:  A/PROM RIGHT   eval   Shoulder extension   Shoulder flexion 145  Shoulder abduction   Shoulder internal rotation   Shoulder external rotation     (Blank rows = not tested)  A/PROM LEFT   eval  Shoulder extension   Shoulder flexion 160  Shoulder abduction   Shoulder internal rotation   Shoulder external rotation     (Blank rows = not tested)  LYMPHEDEMA ASSESSMENTS:   LANDMARK RIGHT  Previous visits with OT 05/20/24 05/29/24 06/05/24 06/12/24 06/17/24 06/19/24 06/21/24 06/26/24  At axilla            15 cm proximal to olecranon process 38 39.7 36.8 38.3 36.7 36.4 36.7  36.7  10 cm proximal to olecranon process 37.5 40.5 38.3 40.2 37.6 37.3 38.1 38.5 39  Olecranon process 33 37.5 34.9 35.3 34.4 34.9 34.4 34.4 35  15 cm proximal to ulnar styloid process 33.5 37.9 35.9 36.2 35.7 35.2 35.6  36.2  10 cm proximal to ulnar styloid process 30 34.1 33.5 33.5 33.1 32.9 33.5 33.5 34.2  Just proximal to ulnar styloid process (at wrist crease) 21.8 21.2 20.8 21.6 21.8 20.4 20.4  21.1 22  Across hand at thumb web space 20 21.5 21.2 21.5 20.4 20.8 20.7 20.9 21  At base of 2nd digit  7.5 6.8 7.4 7.2 6.8 6.7 7.0 7.0  (Blank rows = not tested)  LANDMARK LEFT  eval  At axilla    15 cm proximal to olecranon process 38.4  10 cm proximal to olecranon process 37.3  Olecranon process 32  15 cm proximal to ulnar styloid process 30.2  10 cm proximal to ulnar styloid process 27.3  Just proximal to ulnar styloid process 20.2  Across hand at thumb web space 20.1  At base of 2nd digit 7.3  (Blank rows = not tested)  Lt: Rt: on eval     3649 on 06/26/24 Difference of:  8.46ml length                                                                                                                             TREATMENT DATE:  07/17/24: MLD to Rt UE: Short neck, 5 diaphragmatic breaths, Lt axillary and pectoral nodes, anterior intact thorax sequence, anterior inter-axillary anastomosis, Rt inguinal nodes, Rt axillo-inguinal anastomosis, then Rt UE working from proximal to distal  (lateral upper arm, inner to outer and lateral again; antecubital fossa, ant/post forearm and dorsal wrist/hand and fingers) and retracing all steps back to anastomosis and lymph nodes. Reviewed pressure and  skin stretch with pt and had her return demo of pump along forearm. Hand over hand pressure issued for cuing, she was able to return correct demo after review. Also verbally reviewed sequence out of arm for reinforcement.  P/ROM to Rt shoulder into flex, abd and D2 during MLD with scapular depression by therapist STM to Rt axilla and lateral trunk where pt palpably tight Assisted pt with donning her velcro garment at end of session Addendum: Heard back from [employer] [facility] thru email that pt hasn't met deductible. Let pt know and she is willing to pay OOP so emailed [facility] back to tell them to call her and they confirmed they would reach out.   07/01/24: Therapeutic  Exercises Pulleys into flex x 4 mins, abd x 2 mins Roll yellow ball up wall into flex and Rt UE abd x 10 each, pt started feeling shoulder cramp at end of reps Therapeutic Activities Supine over half foam roll for following: Bil UE horz abd, bil UE scaption into a V and then bil UE abd snow angels 5 sec holds, x 10 each returning therapist demo Manual Therapy MLD to Rt UE: Short neck, superficial and deep abdominals, Lt axillary and pectoral nodes, anterior intact thorax sequence, anterior inter-axillary anastomosis, Rt inguinal nodes, Rt axillo-inguinal anastomosis, then Rt UE working from proximal to distal  (lateral upper arm, inner to outer and lateral again; antecubital fossa, ant/post forearm and dorsal wrist/hand and fingers) and retracing all steps back to anastomosis and lymph nodes.  P/ROM to Rt shoulder into flex, abd and D2 during MLD with scapular depression by therapist  06/26/24 Manual Therapy STM / deeper techniques to forearm with cocoa butter to decrease fibrosis prior to MLD MLD to Rt UE: Short neck, superficial and deep abdominals, bil axillary and pectoral nodes, anterior inter-axillary anastomosis, Rt inguinal nodes, Rt axillo-inguinal anastomosis, then Rt UE working from proximal to distal  (lateral upper arm, inner to outer and lateral again; antecubital fossa, ant/post forearm and dorsal wrist/hand and fingers) and retracing all steps back to anastomosis and lymph nodes. Focused on reviewing self MLD and gave pt handout.  She had learned previously but wanted to have our version.   P/ROM to Rt shoulder into flex, abd and D2 during MLD with scapular depression by therapist Discussed KT to hand with tribute, pt has experience with this and reports it used to help.   06/21/24 Pulleys into flexion and abduction x each Manual Therapy MLD to Rt UE: Short neck, superficial and deep abdominals, Lt axillary and pectoral nodes, anterior intact thorax sequence, anterior  inter-axillary anastomosis, Rt inguinal nodes, Rt axillo-inguinal anastomosis, then Rt UE working from proximal to distal  (lateral upper arm, inner to outer and lateral again; antecubital fossa, ant/post forearm and dorsal wrist/hand and fingers) and retracing all steps back to anastomosis and lymph nodes. P/ROM to Rt shoulder into flex, abd and D2 during MLD with scapular depression by therapist STM to forearm at fibrosis      PATIENT EDUCATION:  Education details: per today's note Person educated: Patient Education method: Explanation Education comprehension: verbalized understanding  HOME EXERCISE PROGRAM:   ASSESSMENT:  CLINICAL IMPRESSION: Pt reports hearing back from [facility] and that they are not in network for her insurance company. So emailed her demographics, with pt permission, to [facility] to see if they can use her insurance. Continued with MLD to Rt UE and P/ROM with STM to tight, restricting Rt axilla and lateral  trunk from radiation fibrosis and scar tissue. Pt reports her arm overall is much looser than we started physical therapy as she can lift it higher now with ADLs. Also her arm used to be achy at times and she hasn't had that for awhile now as her arm is much lighter than it used to be as there is less fluid.   OBJECTIVE IMPAIRMENTS: decreased knowledge of condition, decreased knowledge of use of DME, decreased mobility, decreased ROM, increased edema, and increased fascial restrictions.   ACTIVITY LIMITATIONS: carrying and lifting  PARTICIPATION LIMITATIONS: meal prep, cleaning, community activity, and occupation  PERSONAL FACTORS: Time since onset of injury/illness/exacerbation and 1-2 comorbidities: complete ALND, radiation hx are also affecting patient's functional outcome.   REHAB POTENTIAL: Excellent  CLINICAL DECISION MAKING: Stable/uncomplicated  EVALUATION COMPLEXITY: Low  GOALS: Goals reviewed with patient? Yes  SHORT TERM GOALS:  Target date: 06/28/24  Pt will be ind with self compression bandaging if able Baseline: Goal status: MET  2.  Pt will be ind with self MLD for the Rt UE Baseline:  Goal status: INITIAL  3.  Pt will be educated on cellulitis and lymphedema risk reduction  Baseline:  Goal status: MET   LONG TERM GOALS: Target date: 07/30/24  Pt will be measured for or obtain correct flat knit garments for long term maintenance  Baseline:  Goal status: PARTIALLY MET - has been measured   2.  Pt will be ind with self MLD or pursue a new pump for long term maintenance Baseline:  Goal status: INITIAL    PLAN:  PT FREQUENCY: 2-3x per week   PT DURATION: 4 weeks   PLANNED INTERVENTIONS: 97164- PT Re-evaluation, 97110-Therapeutic exercises, 97530- Therapeutic activity, 97112- Neuromuscular re-education, 97535- Self Care, [id number]- Manual therapy, Patient/Family education, Balance training, Joint mobilization, Therapeutic exercises, Therapeutic activity, Neuromuscular re-education, Gait training, and Self Care  PLAN FOR NEXT SESSION:  Have pt perform self MLD to review and meet goals. Hear back from [facility]? (Waiting on pump for now)  Aden Berwyn Caldron, PTA 07/17/2024, 5:11 PM

## 2024-07-19 ENCOUNTER — Telehealth: Payer: Self-pay

## 2024-07-19 NOTE — Telephone Encounter (Signed)
 This therapist received an email in response to verification of insurance for custom compression garments. Called pt to let her know update. [facility] reports pts calendar year just rolled over in July so she would need to pay OOP. Called pt to let her know of update and she would like to proceed with order at this time. Emailed [facility] back to let them know they can reach out to pt to complete order at this time.

## 2024-07-24 ENCOUNTER — Ambulatory Visit: Payer: Self-pay

## 2024-07-26 ENCOUNTER — Ambulatory Visit

## 2024-07-26 DIAGNOSIS — M25611 Stiffness of right shoulder, not elsewhere classified: Secondary | ICD-10-CM

## 2024-07-26 DIAGNOSIS — I972 Postmastectomy lymphedema syndrome: Secondary | ICD-10-CM

## 2024-07-26 NOTE — Therapy (Signed)
 OUTPATIENT PHYSICAL THERAPY  UPPER EXTREMITY ONCOLOGY TREATMENT  Patient Name: Sue Wong MRN: [id number] DOB:May 05, 1966, 58 y.o., female Today's Date: 07/26/2024  END OF SESSION:  PT End of Session - 07/26/24 0819     Visit Number 16    Number of Visits 25    Date for PT Re-Evaluation 07/30/24    Authorization Type [employer] Approved 13 visits-05/20/2024-08/18/2024;    Authorization - Visit Number 16    Authorization - Number of Visits 18    PT Start Time [phone]   pt arrived late   PT Stop Time 0901    PT Time Calculation (min) 54 min    Activity Tolerance Patient tolerated treatment well    Behavior During Therapy WFL for tasks assessed/performed             Past Medical History:  Diagnosis Date   Arthritis    Breast cancer (HCC)    GERD (gastroesophageal reflux disease)    Psoriasis    Rocky [place] spotted fever    Past Surgical History:  Procedure Laterality Date   BREAST SURGERY Bilateral 1/9 and 12/27/16   Patient Active Problem List   Diagnosis Date Noted   Sepsis (HCC) 04/26/2023   Cellulitis 06/04/2020   Fever 06/04/2020   Lymphedema 06/04/2020    REFERRING PROVIDER: Ophelia Sage III, MD  REFERRING DIAG: I89.0  THERAPY DIAG:  Postmastectomy lymphedema syndrome  Stiffness of right shoulder, not elsewhere classified  ONSET DATE: 2018  Rationale for Evaluation and Treatment: Rehabilitation  SUBJECTIVE:                                                                                                                                                                                           SUBJECTIVE STATEMENT:  I haven't heard from [facility] so if you could let [facility] I'll just go with them.   PERTINENT HISTORY: Hx of Rt breast cancer metastasized to LN with bil mastectomy 12/13/2016 and then complete lymphadenectomy 12/27/16,  radiation and chemotherapy, hx of recurrent cellulitis Rt UE, psoriatic arthritis  PAIN:  Are you having  pain? No not right now.     PRECAUTIONS: recurrent Rt arm cellulitis   RED FLAGS: None   WEIGHT BEARING RESTRICTIONS: No  FALLS:  Has patient fallen in last 6 months? No  LIVING ENVIRONMENT: Lives with: lives with their family  OCCUPATION: Not working now.  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    LEISURE:   HAND DOMINANCE: right   PRIOR LEVEL OF FUNCTION: Independent  PATIENT GOALS: try to treat this arm   OBJECTIVE: Note: Objective  measures were completed at Evaluation unless otherwise noted.  COGNITION: Overall cognitive status: Within functional limits for tasks assessed   PALPATION: +1 pitting Rt dorsal hand and forearm, no pitting in the upper arm, tightness in the Rt axilla and pectoralis region  OBSERVATIONS / OTHER ASSESSMENTS: Rt arm larger with adipose appearance. Post radiation changes to Rt axilla   UPPER EXTREMITY AROM/PROM:  A/PROM RIGHT   eval  07/26/24  Shoulder extension    Shoulder flexion 145 150  Shoulder abduction  132  Shoulder internal rotation    Shoulder external rotation      (Blank rows = not tested)  A/PROM LEFT   eval  Shoulder extension   Shoulder flexion 160  Shoulder abduction   Shoulder internal rotation   Shoulder external rotation     (Blank rows = not tested)  LYMPHEDEMA ASSESSMENTS:   LANDMARK RIGHT  Previous visits with OT 05/20/24 05/29/24 06/05/24 06/12/24 06/17/24 06/19/24 06/21/24 06/26/24 07/26/24  At axilla             15 cm proximal to olecranon process 38 39.7 36.8 38.3 36.7 36.4 36.7  36.7 36.8  10 cm proximal to olecranon process 37.5 40.5 38.3 40.2 37.6 37.3 38.1 38.5 39 37.6  Olecranon process 33 37.5 34.9 35.3 34.4 34.9 34.4 34.4 35 34.2  15 cm proximal to ulnar styloid process 33.5 37.9 35.9 36.2 35.7 35.2 35.6  36.2 35.7  10 cm proximal to ulnar styloid process 30 34.1 33.5 33.5 33.1 32.9 33.5 33.5 34.2 33.7  Just proximal to ulnar styloid process (at wrist crease) 21.8 21.2 20.8 21.6  21.8 20.4 20.4 21.1 22 21.7  Across hand at thumb web space 20 21.5 21.2 21.5 20.4 20.8 20.7 20.9 21 20.8  At base of 2nd digit  7.5 6.8 7.4 7.2 6.8 6.7 7.0 7.0 7  (Blank rows = not tested)  LANDMARK LEFT  eval  At axilla    15 cm proximal to olecranon process 38.4  10 cm proximal to olecranon process 37.3  Olecranon process 32  15 cm proximal to ulnar styloid process 30.2  10 cm proximal to ulnar styloid process 27.3  Just proximal to ulnar styloid process 20.2  Across hand at thumb web space 20.1  At base of 2nd digit 7.3  (Blank rows = not tested)  Lt: Rt: on eval     3649 on 06/26/24 Difference of:  8.69ml length                                                                                                                             TREATMENT DATE:  07/26/24: Self Care Discussed current garment situation. Pt has not heard from [facility] so would instead like to move forward with using [facility] as they were a little more affordable. Email sent to [facility] asking them to reach out to pt to finalize order per pt request.  Manual Therapy  Circumference Measurements retaken MLD to Rt UE: Short neck, 5 diaphragmatic breaths, Lt axillary and pectoral nodes, anterior intact thorax sequence, anterior inter-axillary anastomosis, Rt inguinal nodes, Rt axillo-inguinal anastomosis, then Rt UE working from proximal to distal  (lateral upper arm, inner to outer and lateral again; antecubital fossa, ant/post forearm and dorsal wrist/hand and fingers) and retracing all steps back to anastomosis and lymph nodes. Reviewed pressure and skin stretch with pt and had her return demo of pump along forearm. Hand over hand pressure issued for cuing, she was able to return correct demo after review. Also verbally reviewed sequence out of arm for reinforcement.  P/ROM to Rt shoulder into flex, abd and D2 during MLD with scapular depression by therapist STM to Rt axilla and  lateral trunk where pt palpably tight from scar tissue and radiation fibrosis.  07/17/24: MLD to Rt UE: Short neck, 5 diaphragmatic breaths, Lt axillary and pectoral nodes, anterior intact thorax sequence, anterior inter-axillary anastomosis, Rt inguinal nodes, Rt axillo-inguinal anastomosis, then Rt UE working from proximal to distal  (lateral upper arm, inner to outer and lateral again; antecubital fossa, ant/post forearm and dorsal wrist/hand and fingers) and retracing all steps back to anastomosis and lymph nodes. Reviewed pressure and skin stretch with pt and had her return demo of pump along forearm. Hand over hand pressure issued for cuing, she was able to return correct demo after review. Also verbally reviewed sequence out of arm for reinforcement.  P/ROM to Rt shoulder into flex, abd and D2 during MLD with scapular depression by therapist STM to Rt axilla and lateral trunk where pt palpably tight Assisted pt with donning her velcro garment at end of session Addendum: Heard back from [facility] thru email that pt hasn't met deductible. Let pt know and she is willing to pay OOP so emailed [facility] back to tell them to call her and they confirmed they would reach out.   07/01/24: Therapeutic Exercises Pulleys into flex x 4 mins, abd x 2 mins Roll yellow ball up wall into flex and Rt UE abd x 10 each, pt started feeling shoulder cramp at end of reps Therapeutic Activities Supine over half foam roll for following: Bil UE horz abd, bil UE scaption into a V and then bil UE abd snow angels 5 sec holds, x 10 each returning therapist demo Manual Therapy MLD to Rt UE: Short neck, superficial and deep abdominals, Lt axillary and pectoral nodes, anterior intact thorax sequence, anterior inter-axillary anastomosis, Rt inguinal nodes, Rt axillo-inguinal anastomosis, then Rt UE working from proximal to distal  (lateral upper arm, inner to outer and lateral again; antecubital fossa, ant/post  forearm and dorsal wrist/hand and fingers) and retracing all steps back to anastomosis and lymph nodes.  P/ROM to Rt shoulder into flex, abd and D2 during MLD with scapular depression by therapist  06/26/24 Manual Therapy STM / deeper techniques to forearm with cocoa butter to decrease fibrosis prior to MLD MLD to Rt UE: Short neck, superficial and deep abdominals, bil axillary and pectoral nodes, anterior inter-axillary anastomosis, Rt inguinal nodes, Rt axillo-inguinal anastomosis, then Rt UE working from proximal to distal  (lateral upper arm, inner to outer and lateral again; antecubital fossa, ant/post forearm and dorsal wrist/hand and fingers) and retracing all steps back to anastomosis and lymph nodes. Focused on reviewing self MLD and gave pt handout.  She had learned previously but wanted to have our version.   P/ROM to Rt shoulder into flex, abd and  D2 during MLD with scapular depression by therapist Discussed KT to hand with tribute, pt has experience with this and reports it used to help.   06/21/24 Pulleys into flexion and abduction x each Manual Therapy MLD to Rt UE: Short neck, superficial and deep abdominals, Lt axillary and pectoral nodes, anterior intact thorax sequence, anterior inter-axillary anastomosis, Rt inguinal nodes, Rt axillo-inguinal anastomosis, then Rt UE working from proximal to distal  (lateral upper arm, inner to outer and lateral again; antecubital fossa, ant/post forearm and dorsal wrist/hand and fingers) and retracing all steps back to anastomosis and lymph nodes. P/ROM to Rt shoulder into flex, abd and D2 during MLD with scapular depression by therapist STM to forearm at fibrosis      PATIENT EDUCATION:  Education details: per today's note Person educated: Patient Education method: Explanation Education comprehension: verbalized understanding  HOME EXERCISE PROGRAM:   ASSESSMENT:  CLINICAL IMPRESSION: Pt had not heard from [facility]  despite therapist having confirmed with them thru email that pt would like to move forward with placing her order with them. So pt instead will move forward with [facility]. Email sent to contact there to please reach out to pt preferably today to get order placed. Her circumference measurements have maintained since she was last here so she is managing her lymphedema symptoms well at home. She would like to be placed on hold until her custom compression garments arrive and hopes to come back for one more visit for final assess at that time if her ins shara is still good.   OBJECTIVE IMPAIRMENTS: decreased knowledge of condition, decreased knowledge of use of DME, decreased mobility, decreased ROM, increased edema, and increased fascial restrictions.   ACTIVITY LIMITATIONS: carrying and lifting  PARTICIPATION LIMITATIONS: meal prep, cleaning, community activity, and occupation  PERSONAL FACTORS: Time since onset of injury/illness/exacerbation and 1-2 comorbidities: complete ALND, radiation hx are also affecting patient's functional outcome.   REHAB POTENTIAL: Excellent  CLINICAL DECISION MAKING: Stable/uncomplicated  EVALUATION COMPLEXITY: Low  GOALS: Goals reviewed with patient? Yes  SHORT TERM GOALS: Target date: 06/28/24  Pt will be ind with self compression bandaging if able Baseline: Goal status: MET  2.  Pt will be ind with self MLD for the Rt UE Baseline:  Goal status: MET  3.  Pt will be educated on cellulitis and lymphedema risk reduction  Baseline:  Goal status: MET   LONG TERM GOALS: Target date: 07/30/24  Pt will be measured for or obtain correct flat knit garments for long term maintenance  Baseline:  Goal status:  MET - has been measured , 07/26/24 - plans to order these today if company calls her, she is independent in the mean time with her velcro garment  2.  Pt will be ind with self MLD or pursue a new pump for long term maintenance Baseline:  Goal status:  MET 07/26/24 - pt independent with self MLD, does not want to pursue pump at this time    PLAN:  PT FREQUENCY: 2-3x per week   PT DURATION: 4 weeks   PLANNED INTERVENTIONS: 97164- PT Re-evaluation, 97110-Therapeutic exercises, 97530- Therapeutic activity, 97112- Neuromuscular re-education, 97535- Self Care, [id number]- Manual therapy, Patient/Family education, Balance training, Joint mobilization, Therapeutic exercises, Therapeutic activity, Neuromuscular re-education, Gait training, and Self Care  PLAN FOR NEXT SESSION:  Pt on hold for now until new compression garments arrive.   Aden Berwyn Caldron, PTA 07/26/2024, 9:20 AM

## 2024-08-20 ENCOUNTER — Ambulatory Visit: Attending: Internal Medicine

## 2024-08-20 DIAGNOSIS — I972 Postmastectomy lymphedema syndrome: Secondary | ICD-10-CM | POA: Insufficient documentation

## 2024-08-20 DIAGNOSIS — M25611 Stiffness of right shoulder, not elsewhere classified: Secondary | ICD-10-CM | POA: Diagnosis present

## 2024-08-20 NOTE — Therapy (Signed)
 OUTPATIENT PHYSICAL THERAPY  UPPER EXTREMITY ONCOLOGY TREATMENT  Patient Name: Sue Wong MRN: [id number] DOB:11/13/1966, 58 y.o., female Today's Date: 08/20/2024  END OF SESSION:  PT End of Session - 08/20/24 0913     Visit Number 14    Number of Visits 25    Date for PT Re-Evaluation 07/30/24   D/C this visit   Authorization Type [employer] Approved 13 visits-05/20/2024-08/18/2024;    Authorization - Visit Number 17    Authorization - Number of Visits 18    PT Start Time 0911    PT Stop Time 0935   pt reports just wanting new sleeve checked   PT Time Calculation (min) 24 min    Activity Tolerance Patient tolerated treatment well    Behavior During Therapy WFL for tasks assessed/performed             Past Medical History:  Diagnosis Date   Arthritis    Breast cancer (HCC)    GERD (gastroesophageal reflux disease)    Psoriasis    [place] spotted fever    Past Surgical History:  Procedure Laterality Date   BREAST SURGERY Bilateral 1/9 and 12/27/16   Patient Active Problem List   Diagnosis Date Noted   Sepsis (HCC) 04/26/2023   Cellulitis 06/04/2020   Fever 06/04/2020   Lymphedema 06/04/2020    REFERRING PROVIDER: Ophelia Sage III, MD  REFERRING DIAG: I89.0  THERAPY DIAG:  Postmastectomy lymphedema syndrome  Stiffness of right shoulder, not elsewhere classified  ONSET DATE: 2018  Rationale for Evaluation and Treatment: Rehabilitation  SUBJECTIVE:                                                                                                                                                                                           SUBJECTIVE STATEMENT:  I got my new flat knit garments and just want you to tell me if they fit okay.  PERTINENT HISTORY: Hx of Rt breast cancer metastasized to LN with bil mastectomy 12/13/2016 and then complete lymphadenectomy 12/27/16,  radiation and chemotherapy, hx of recurrent cellulitis Rt UE, psoriatic  arthritis  PAIN:  Are you having pain? No not right now.     PRECAUTIONS: recurrent Rt arm cellulitis   RED FLAGS: None   WEIGHT BEARING RESTRICTIONS: No  FALLS:  Has patient fallen in last 6 months? No  LIVING ENVIRONMENT: Lives with: lives with their family  OCCUPATION: Not working now.  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    LEISURE:   HAND DOMINANCE: right   PRIOR LEVEL OF FUNCTION: Independent  PATIENT GOALS: try to treat this  arm   OBJECTIVE: Note: Objective measures were completed at Evaluation unless otherwise noted.  COGNITION: Overall cognitive status: Within functional limits for tasks assessed   PALPATION: +1 pitting Rt dorsal hand and forearm, no pitting in the upper arm, tightness in the Rt axilla and pectoralis region  OBSERVATIONS / OTHER ASSESSMENTS: Rt arm larger with adipose appearance. Post radiation changes to Rt axilla   UPPER EXTREMITY AROM/PROM:  A/PROM RIGHT   eval  07/26/24 08/20/24  Shoulder extension     Shoulder flexion 145 150 156  Shoulder abduction  132 142  Shoulder internal rotation     Shoulder external rotation       (Blank rows = not tested)  A/PROM LEFT   eval  Shoulder extension   Shoulder flexion 160  Shoulder abduction   Shoulder internal rotation   Shoulder external rotation     (Blank rows = not tested)  LYMPHEDEMA ASSESSMENTS:   LANDMARK RIGHT  Previous visits with OT 05/20/24 05/29/24 06/05/24 06/12/24 06/17/24 06/19/24 06/21/24 06/26/24 07/26/24 08/20/24  At axilla              15 cm proximal to olecranon process 38 39.7 36.8 38.3 36.7 36.4 36.7  36.7 36.8 36.4  10 cm proximal to olecranon process 37.5 40.5 38.3 40.2 37.6 37.3 38.1 38.5 39 37.6 37.4  Olecranon process 33 37.5 34.9 35.3 34.4 34.9 34.4 34.4 35 34.2 33.9  15 cm proximal to ulnar styloid process 33.5 37.9 35.9 36.2 35.7 35.2 35.6  36.2 35.7 35.5  10 cm proximal to ulnar styloid process 30 34.1 33.5 33.5 33.1 32.9 33.5 33.5 34.2  33.7 32.8  Just proximal to ulnar styloid process (at wrist crease) 21.8 21.2 20.8 21.6 21.8 20.4 20.4 21.1 22 21.7 21.5  Across hand at thumb web space 20 21.5 21.2 21.5 20.4 20.8 20.7 20.9 21 20.8 19.7  At base of 2nd digit  7.5 6.8 7.4 7.2 6.8 6.7 7.0 7.0 7 6.7  (Blank rows = not tested)  LANDMARK LEFT  eval  At axilla    15 cm proximal to olecranon process 38.4  10 cm proximal to olecranon process 37.3  Olecranon process 32  15 cm proximal to ulnar styloid process 30.2  10 cm proximal to ulnar styloid process 27.3  Just proximal to ulnar styloid process 20.2  Across hand at thumb web space 20.1  At base of 2nd digit 7.3  (Blank rows = not tested)  Lt: Rt: on eval     3649 on 06/26/24 Difference of:  8.102ml length                                                                                                                             TREATMENT DATE:  08/20/24: Circumference measurements taken Orthotic Fit Initial Pt arrives with her new flat knit garments. Donned these for her while instructing her in same and showing her how seam goes on the  back of her arm and where elbow relief should land once she pulls this onto her arm. Showed her how to don glove and how to adjust fingers. Also re measured length of pinky as her glove was a bit too long here and wrote new measurement on sheet issued to pt with original measurements.  Self Care Spent time showing pt donning gloves to instruct her on how this will help her to pinch and pull sleeve up her arm. Also printed out her measurements and demographics sheet so pt can try to get reimbursed from her insurance and possibly order more from [facility]. Answered all pts questions.   07/26/24: Self Care Discussed current garment situation. Pt has not heard from [facility] so would instead like to move forward with using [facility] as they were a little more affordable. Email sent to [facility] asking  them to reach out to pt to finalize order per pt request.  Manual Therapy Circumference Measurements retaken MLD to Rt UE: Short neck, 5 diaphragmatic breaths, Lt axillary and pectoral nodes, anterior intact thorax sequence, anterior inter-axillary anastomosis, Rt inguinal nodes, Rt axillo-inguinal anastomosis, then Rt UE working from proximal to distal  (lateral upper arm, inner to outer and lateral again; antecubital fossa, ant/post forearm and dorsal wrist/hand and fingers) and retracing all steps back to anastomosis and lymph nodes. Reviewed pressure and skin stretch with pt and had her return demo of pump along forearm. Hand over hand pressure issued for cuing, she was able to return correct demo after review. Also verbally reviewed sequence out of arm for reinforcement.  P/ROM to Rt shoulder into flex, abd and D2 during MLD with scapular depression by therapist STM to Rt axilla and lateral trunk where pt palpably tight from scar tissue and radiation fibrosis.  07/17/24: MLD to Rt UE: Short neck, 5 diaphragmatic breaths, Lt axillary and pectoral nodes, anterior intact thorax sequence, anterior inter-axillary anastomosis, Rt inguinal nodes, Rt axillo-inguinal anastomosis, then Rt UE working from proximal to distal  (lateral upper arm, inner to outer and lateral again; antecubital fossa, ant/post forearm and dorsal wrist/hand and fingers) and retracing all steps back to anastomosis and lymph nodes. Reviewed pressure and skin stretch with pt and had her return demo of pump along forearm. Hand over hand pressure issued for cuing, she was able to return correct demo after review. Also verbally reviewed sequence out of arm for reinforcement.  P/ROM to Rt shoulder into flex, abd and D2 during MLD with scapular depression by therapist STM to Rt axilla and lateral trunk where pt palpably tight Assisted pt with donning her velcro garment at end of session Addendum: Heard back from [facility] thru email  that pt hasn't met deductible. Let pt know and she is willing to pay OOP so emailed [facility] back to tell them to call her and they confirmed they would reach out.       PATIENT EDUCATION:  Education details: per today's note Person educated: Patient Education method: Explanation Education comprehension: verbalized understanding  HOME EXERCISE PROGRAM:   ASSESSMENT:  CLINICAL IMPRESSION: Pt arrives with her new flat knit garments and overall these are a great fit. Only complaint pt has is the pinky feels a bit long so adjusted this on her measurement form and issued original forms with measurements to pt so that if she would like to order more she is able. Pt has done great and is ready for D/C at this time. She  knows she can reach out to this therapist and/or clinic prn in the future and we would be pleased to work with Nathanel again.   OBJECTIVE IMPAIRMENTS: decreased knowledge of condition, decreased knowledge of use of DME, decreased mobility, decreased ROM, increased edema, and increased fascial restrictions.   ACTIVITY LIMITATIONS: carrying and lifting  PARTICIPATION LIMITATIONS: meal prep, cleaning, community activity, and occupation  PERSONAL FACTORS: Time since onset of injury/illness/exacerbation and 1-2 comorbidities: complete ALND, radiation hx are also affecting patient's functional outcome.   REHAB POTENTIAL: Excellent  CLINICAL DECISION MAKING: Stable/uncomplicated  EVALUATION COMPLEXITY: Low  GOALS: Goals reviewed with patient? Yes  SHORT TERM GOALS: Target date: 06/28/24  Pt will be ind with self compression bandaging if able Baseline: Goal status: MET  2.  Pt will be ind with self MLD for the Rt UE Baseline:  Goal status: MET  3.  Pt will be educated on cellulitis and lymphedema risk reduction  Baseline:  Goal status: MET   LONG TERM GOALS: Target date: 07/30/24  Pt will be measured for or obtain correct flat knit garments for long term  maintenance  Baseline:  Goal status:  MET - has been measured , 07/26/24 - plans to order these today if company calls her, she is independent in the mean time with her velcro garment  2.  Pt will be ind with self MLD or pursue a new pump for long term maintenance Baseline:  Goal status: MET 07/26/24 - pt independent with self MLD, does not want to pursue pump at this time    PLAN:  PT FREQUENCY: 2-3x per week   PT DURATION: 4 weeks   PLANNED INTERVENTIONS: 97164- PT Re-evaluation, 97110-Therapeutic exercises, 97530- Therapeutic activity, 97112- Neuromuscular re-education, 97535- Self Care, [id number]- Manual therapy, Patient/Family education, Balance training, Joint mobilization, Therapeutic exercises, Therapeutic activity, Neuromuscular re-education, Gait training, and Self Care  PLAN FOR NEXT SESSION:  D/C this visit.   Aden Berwyn Caldron, PTA 08/20/2024, 9:45 AM   PHYSICAL THERAPY DISCHARGE SUMMARY  Visits from Start of Care: 18  Current functional level related to goals / functional outcomes: See above   Remaining deficits: Chronic lymphedema    Education / Equipment: Final self care plan.   Plan: Patient agrees to discharge.  Patient is being discharged due to meeting the stated rehab goals.
